# Patient Record
Sex: Female | Born: 1965 | Race: Black or African American | Hispanic: No | Smoking: Former smoker
Health system: Southern US, Community
[De-identification: ages and names within clinical notes are randomized; demographics above are authoritative.]

## PROBLEM LIST (undated history)

## (undated) DIAGNOSIS — R45851 Suicidal ideations: Secondary | ICD-10-CM

## (undated) DIAGNOSIS — K219 Gastro-esophageal reflux disease without esophagitis: Secondary | ICD-10-CM

## (undated) DIAGNOSIS — I219 Acute myocardial infarction, unspecified: Secondary | ICD-10-CM

## (undated) DIAGNOSIS — J45909 Unspecified asthma, uncomplicated: Secondary | ICD-10-CM

## (undated) DIAGNOSIS — T50901A Poisoning by unspecified drugs, medicaments and biological substances, accidental (unintentional), initial encounter: Secondary | ICD-10-CM

## (undated) DIAGNOSIS — F259 Schizoaffective disorder, unspecified: Secondary | ICD-10-CM

## (undated) DIAGNOSIS — F329 Major depressive disorder, single episode, unspecified: Secondary | ICD-10-CM

## (undated) DIAGNOSIS — S82891A Other fracture of right lower leg, initial encounter for closed fracture: Secondary | ICD-10-CM

## (undated) DIAGNOSIS — I5021 Acute systolic (congestive) heart failure: Secondary | ICD-10-CM

## (undated) DIAGNOSIS — F32A Depression, unspecified: Secondary | ICD-10-CM

## (undated) HISTORY — PX: BACK SURGERY: SHX140

## (undated) HISTORY — PX: OTHER SURGICAL HISTORY: SHX169

## (undated) HISTORY — DX: Poisoning by unspecified drugs, medicaments and biological substances, accidental (unintentional), initial encounter: T50.901A

## (undated) HISTORY — PX: LIPOMA EXCISION: SHX5283

## (undated) HISTORY — PX: HAMMER TOE SURGERY: SHX385

## (undated) HISTORY — DX: Acute systolic (congestive) heart failure: I50.21

## (undated) HISTORY — PX: FOOT FASCIOTOMY: SHX1659

## (undated) HISTORY — DX: Suicidal ideations: R45.851

## (undated) HISTORY — PX: ENDOMETRIAL ABLATION W/ NOVASURE: SUR434

## (undated) HISTORY — DX: Acute myocardial infarction, unspecified: I21.9

## (undated) HISTORY — PX: KNEE ARTHROCENTESIS: SUR44

---

## 2001-08-28 ENCOUNTER — Emergency Department (HOSPITAL_COMMUNITY): Admission: EM | Admit: 2001-08-28 | Discharge: 2001-08-28 | Payer: Self-pay | Admitting: Emergency Medicine

## 2001-09-25 ENCOUNTER — Emergency Department (HOSPITAL_COMMUNITY): Admission: EM | Admit: 2001-09-25 | Discharge: 2001-09-25 | Payer: Self-pay | Admitting: Emergency Medicine

## 2001-11-16 ENCOUNTER — Emergency Department (HOSPITAL_COMMUNITY): Admission: EM | Admit: 2001-11-16 | Discharge: 2001-11-16 | Payer: Self-pay | Admitting: Emergency Medicine

## 2003-11-08 ENCOUNTER — Emergency Department (HOSPITAL_COMMUNITY): Admission: EM | Admit: 2003-11-08 | Discharge: 2003-11-08 | Payer: Self-pay | Admitting: *Deleted

## 2003-11-15 ENCOUNTER — Emergency Department (HOSPITAL_COMMUNITY): Admission: EM | Admit: 2003-11-15 | Discharge: 2003-11-15 | Payer: Self-pay | Admitting: Emergency Medicine

## 2006-01-17 ENCOUNTER — Emergency Department (HOSPITAL_COMMUNITY): Admission: EM | Admit: 2006-01-17 | Discharge: 2006-01-17 | Payer: Self-pay | Admitting: Emergency Medicine

## 2007-06-07 ENCOUNTER — Emergency Department (HOSPITAL_COMMUNITY): Admission: EM | Admit: 2007-06-07 | Discharge: 2007-06-07 | Payer: Self-pay | Admitting: Emergency Medicine

## 2008-07-01 ENCOUNTER — Emergency Department (HOSPITAL_COMMUNITY): Admission: EM | Admit: 2008-07-01 | Discharge: 2008-07-01 | Payer: Self-pay | Admitting: Emergency Medicine

## 2008-08-15 ENCOUNTER — Ambulatory Visit: Payer: Self-pay | Admitting: Diagnostic Radiology

## 2008-08-15 ENCOUNTER — Emergency Department (HOSPITAL_BASED_OUTPATIENT_CLINIC_OR_DEPARTMENT_OTHER): Admission: EM | Admit: 2008-08-15 | Discharge: 2008-08-15 | Payer: Self-pay | Admitting: Emergency Medicine

## 2008-09-24 ENCOUNTER — Emergency Department (HOSPITAL_BASED_OUTPATIENT_CLINIC_OR_DEPARTMENT_OTHER): Admission: EM | Admit: 2008-09-24 | Discharge: 2008-09-24 | Payer: Self-pay | Admitting: Emergency Medicine

## 2008-12-30 ENCOUNTER — Ambulatory Visit: Payer: Self-pay | Admitting: Diagnostic Radiology

## 2008-12-30 ENCOUNTER — Emergency Department (HOSPITAL_BASED_OUTPATIENT_CLINIC_OR_DEPARTMENT_OTHER): Admission: EM | Admit: 2008-12-30 | Discharge: 2008-12-30 | Payer: Self-pay | Admitting: Emergency Medicine

## 2009-07-01 ENCOUNTER — Emergency Department (HOSPITAL_BASED_OUTPATIENT_CLINIC_OR_DEPARTMENT_OTHER): Admission: EM | Admit: 2009-07-01 | Discharge: 2009-07-01 | Payer: Self-pay | Admitting: Emergency Medicine

## 2009-08-01 ENCOUNTER — Emergency Department (HOSPITAL_BASED_OUTPATIENT_CLINIC_OR_DEPARTMENT_OTHER): Admission: EM | Admit: 2009-08-01 | Discharge: 2009-08-01 | Payer: Self-pay | Admitting: Emergency Medicine

## 2009-10-13 ENCOUNTER — Emergency Department (HOSPITAL_BASED_OUTPATIENT_CLINIC_OR_DEPARTMENT_OTHER): Admission: EM | Admit: 2009-10-13 | Discharge: 2009-10-13 | Payer: Self-pay | Admitting: Emergency Medicine

## 2010-01-26 ENCOUNTER — Emergency Department (HOSPITAL_BASED_OUTPATIENT_CLINIC_OR_DEPARTMENT_OTHER): Admission: EM | Admit: 2010-01-26 | Discharge: 2010-01-26 | Payer: Self-pay | Admitting: Emergency Medicine

## 2010-02-14 ENCOUNTER — Emergency Department (HOSPITAL_BASED_OUTPATIENT_CLINIC_OR_DEPARTMENT_OTHER): Admission: EM | Admit: 2010-02-14 | Discharge: 2010-02-14 | Payer: Self-pay | Admitting: Emergency Medicine

## 2010-06-12 ENCOUNTER — Emergency Department (HOSPITAL_BASED_OUTPATIENT_CLINIC_OR_DEPARTMENT_OTHER)
Admission: EM | Admit: 2010-06-12 | Discharge: 2010-06-12 | Disposition: A | Discharge status: Home / Self Care | Payer: PRIVATE HEALTH INSURANCE | Attending: Emergency Medicine | Admitting: Emergency Medicine

## 2010-06-12 DIAGNOSIS — E119 Type 2 diabetes mellitus without complications: Secondary | ICD-10-CM | POA: Insufficient documentation

## 2010-06-12 DIAGNOSIS — F319 Bipolar disorder, unspecified: Secondary | ICD-10-CM | POA: Insufficient documentation

## 2010-06-12 DIAGNOSIS — M549 Dorsalgia, unspecified: Secondary | ICD-10-CM | POA: Insufficient documentation

## 2010-06-12 DIAGNOSIS — J45909 Unspecified asthma, uncomplicated: Secondary | ICD-10-CM | POA: Insufficient documentation

## 2010-07-12 LAB — URINALYSIS, ROUTINE W REFLEX MICROSCOPIC
Bilirubin Urine: NEGATIVE
Glucose, UA: NEGATIVE mg/dL
Hgb urine dipstick: NEGATIVE
Ketones, ur: NEGATIVE mg/dL
Nitrite: NEGATIVE
pH: 7 (ref 5.0–8.0)

## 2010-07-12 LAB — URINE CULTURE
Colony Count: 45000
Culture  Setup Time: 201110190632

## 2010-07-12 LAB — URINE MICROSCOPIC-ADD ON

## 2010-07-13 LAB — URINALYSIS, ROUTINE W REFLEX MICROSCOPIC
Bilirubin Urine: NEGATIVE
Glucose, UA: 500 mg/dL — AB
Hgb urine dipstick: NEGATIVE
Ketones, ur: NEGATIVE mg/dL
pH: 5.5 (ref 5.0–8.0)

## 2010-07-13 LAB — URINE MICROSCOPIC-ADD ON

## 2010-07-19 LAB — GLUCOSE, CAPILLARY: Glucose-Capillary: 209 mg/dL — ABNORMAL HIGH (ref 70–99)

## 2010-07-24 LAB — GC/CHLAMYDIA PROBE AMP, GENITAL
Chlamydia, DNA Probe: NEGATIVE
GC Probe Amp, Genital: NEGATIVE

## 2010-07-24 LAB — WET PREP, GENITAL
Trich, Wet Prep: NONE SEEN
Yeast Wet Prep HPF POC: NONE SEEN

## 2010-07-24 LAB — URINALYSIS, ROUTINE W REFLEX MICROSCOPIC
Bilirubin Urine: NEGATIVE
Glucose, UA: NEGATIVE mg/dL
Hgb urine dipstick: NEGATIVE
Ketones, ur: NEGATIVE mg/dL
Nitrite: NEGATIVE
Protein, ur: NEGATIVE mg/dL
Specific Gravity, Urine: 1.017 (ref 1.005–1.030)
Urobilinogen, UA: 1 mg/dL (ref 0.0–1.0)
pH: 5.5 (ref 5.0–8.0)

## 2010-08-04 LAB — WET PREP, GENITAL: Trich, Wet Prep: NONE SEEN

## 2010-08-04 LAB — URINALYSIS, ROUTINE W REFLEX MICROSCOPIC
Glucose, UA: NEGATIVE mg/dL
Hgb urine dipstick: NEGATIVE
Ketones, ur: NEGATIVE mg/dL
Protein, ur: NEGATIVE mg/dL
Urobilinogen, UA: 0.2 mg/dL (ref 0.0–1.0)

## 2010-08-04 LAB — GC/CHLAMYDIA PROBE AMP, GENITAL: GC Probe Amp, Genital: NEGATIVE

## 2010-08-08 LAB — URINALYSIS, ROUTINE W REFLEX MICROSCOPIC
Glucose, UA: NEGATIVE mg/dL
Ketones, ur: NEGATIVE mg/dL
Nitrite: NEGATIVE
Specific Gravity, Urine: 1.008 (ref 1.005–1.030)
pH: 6 (ref 5.0–8.0)

## 2010-08-08 LAB — WET PREP, GENITAL
Trich, Wet Prep: NONE SEEN
Yeast Wet Prep HPF POC: NONE SEEN

## 2010-08-08 LAB — URINE MICROSCOPIC-ADD ON

## 2010-08-08 LAB — PREGNANCY, URINE: Preg Test, Ur: NEGATIVE

## 2010-08-09 LAB — DIFFERENTIAL
Eosinophils Absolute: 0.2 10*3/uL (ref 0.0–0.7)
Lymphs Abs: 4.5 10*3/uL — ABNORMAL HIGH (ref 0.7–4.0)
Monocytes Absolute: 0.5 10*3/uL (ref 0.1–1.0)
Monocytes Relative: 5 % (ref 3–12)
Neutro Abs: 4.6 10*3/uL (ref 1.7–7.7)
Neutrophils Relative %: 47 % (ref 43–77)

## 2010-08-09 LAB — CBC
Hemoglobin: 13.9 g/dL (ref 12.0–15.0)
MCV: 89.5 fL (ref 78.0–100.0)
RBC: 4.72 MIL/uL (ref 3.87–5.11)
WBC: 9.9 10*3/uL (ref 4.0–10.5)

## 2010-08-09 LAB — URINALYSIS, ROUTINE W REFLEX MICROSCOPIC
Bilirubin Urine: NEGATIVE
Glucose, UA: NEGATIVE mg/dL
Hgb urine dipstick: NEGATIVE
Specific Gravity, Urine: 1.015 (ref 1.005–1.030)
Urobilinogen, UA: 1 mg/dL (ref 0.0–1.0)
pH: 6 (ref 5.0–8.0)

## 2010-08-09 LAB — BASIC METABOLIC PANEL
CO2: 28 mEq/L (ref 19–32)
Chloride: 105 mEq/L (ref 96–112)
Creatinine, Ser: 0.8 mg/dL (ref 0.4–1.2)
GFR calc Af Amer: >60 mL/min (ref >60)
Sodium: 143 mEq/L (ref 135–145)

## 2010-08-09 LAB — PREGNANCY, URINE: Preg Test, Ur: NEGATIVE

## 2010-08-10 LAB — BASIC METABOLIC PANEL
BUN: 7 mg/dL (ref 6–23)
CO2: 22 mEq/L (ref 19–32)
Glucose, Bld: 103 mg/dL — ABNORMAL HIGH (ref 70–99)
Potassium: 3.9 mEq/L (ref 3.5–5.1)
Sodium: 136 mEq/L (ref 135–145)

## 2010-08-10 LAB — CBC
HCT: 37.8 % (ref 36.0–46.0)
Hemoglobin: 13 g/dL (ref 12.0–15.0)
MCHC: 34.3 g/dL (ref 30.0–36.0)
RDW: 14.2 % (ref 11.5–15.5)

## 2010-08-10 LAB — DIFFERENTIAL
Basophils Absolute: 0.1 10*3/uL (ref 0.0–0.1)
Basophils Relative: 1 % (ref 0–1)
Eosinophils Absolute: 0.2 10*3/uL (ref 0.0–0.7)
Eosinophils Relative: 3 % (ref 0–5)
Monocytes Absolute: 0.5 10*3/uL (ref 0.1–1.0)

## 2010-10-08 ENCOUNTER — Emergency Department (HOSPITAL_BASED_OUTPATIENT_CLINIC_OR_DEPARTMENT_OTHER)
Admission: EM | Admit: 2010-10-08 | Discharge: 2010-10-08 | Disposition: A | Discharge status: Home / Self Care | Payer: PRIVATE HEALTH INSURANCE | Attending: Emergency Medicine | Admitting: Emergency Medicine

## 2010-10-08 DIAGNOSIS — J45909 Unspecified asthma, uncomplicated: Secondary | ICD-10-CM | POA: Insufficient documentation

## 2010-10-08 DIAGNOSIS — R509 Fever, unspecified: Secondary | ICD-10-CM | POA: Insufficient documentation

## 2010-10-08 DIAGNOSIS — F319 Bipolar disorder, unspecified: Secondary | ICD-10-CM | POA: Insufficient documentation

## 2010-10-08 DIAGNOSIS — E119 Type 2 diabetes mellitus without complications: Secondary | ICD-10-CM | POA: Insufficient documentation

## 2010-10-08 DIAGNOSIS — N390 Urinary tract infection, site not specified: Secondary | ICD-10-CM | POA: Insufficient documentation

## 2010-10-08 LAB — URINE MICROSCOPIC-ADD ON

## 2010-10-08 LAB — URINALYSIS, ROUTINE W REFLEX MICROSCOPIC
Glucose, UA: NEGATIVE mg/dL
Protein, ur: NEGATIVE mg/dL
Specific Gravity, Urine: 1.007 (ref 1.005–1.030)
pH: 5.5 (ref 5.0–8.0)

## 2010-10-26 ENCOUNTER — Emergency Department (HOSPITAL_BASED_OUTPATIENT_CLINIC_OR_DEPARTMENT_OTHER)
Admission: EM | Admit: 2010-10-26 | Discharge: 2010-10-26 | Disposition: A | Discharge status: Home / Self Care | Payer: PRIVATE HEALTH INSURANCE | Attending: Emergency Medicine | Admitting: Emergency Medicine

## 2010-10-26 ENCOUNTER — Emergency Department (INDEPENDENT_AMBULATORY_CARE_PROVIDER_SITE_OTHER): Payer: PRIVATE HEALTH INSURANCE

## 2010-10-26 DIAGNOSIS — Y92009 Unspecified place in unspecified non-institutional (private) residence as the place of occurrence of the external cause: Secondary | ICD-10-CM | POA: Insufficient documentation

## 2010-10-26 DIAGNOSIS — J45909 Unspecified asthma, uncomplicated: Secondary | ICD-10-CM | POA: Insufficient documentation

## 2010-10-26 DIAGNOSIS — M25579 Pain in unspecified ankle and joints of unspecified foot: Secondary | ICD-10-CM

## 2010-10-26 DIAGNOSIS — S82899A Other fracture of unspecified lower leg, initial encounter for closed fracture: Secondary | ICD-10-CM | POA: Insufficient documentation

## 2010-10-26 DIAGNOSIS — W19XXXA Unspecified fall, initial encounter: Secondary | ICD-10-CM

## 2010-10-26 DIAGNOSIS — E119 Type 2 diabetes mellitus without complications: Secondary | ICD-10-CM | POA: Insufficient documentation

## 2010-10-26 DIAGNOSIS — W108XXA Fall (on) (from) other stairs and steps, initial encounter: Secondary | ICD-10-CM | POA: Insufficient documentation

## 2010-10-26 DIAGNOSIS — F319 Bipolar disorder, unspecified: Secondary | ICD-10-CM | POA: Insufficient documentation

## 2010-10-31 ENCOUNTER — Ambulatory Visit (INDEPENDENT_AMBULATORY_CARE_PROVIDER_SITE_OTHER): Payer: Self-pay | Admitting: Family Medicine

## 2010-10-31 ENCOUNTER — Encounter: Payer: Self-pay | Admitting: Family Medicine

## 2010-10-31 VITALS — BP 126/81 | HR 81 | Temp 98.0°F | Ht 63.0 in | Wt 186.0 lb

## 2010-10-31 DIAGNOSIS — S99929A Unspecified injury of unspecified foot, initial encounter: Secondary | ICD-10-CM

## 2010-10-31 DIAGNOSIS — S99911A Unspecified injury of right ankle, initial encounter: Secondary | ICD-10-CM

## 2010-10-31 DIAGNOSIS — S8990XA Unspecified injury of unspecified lower leg, initial encounter: Secondary | ICD-10-CM

## 2010-10-31 NOTE — Patient Instructions (Signed)
You have a very small avulsion fracture of your ankle - this is treated the same as you would a severe ankle sprain. Ice the area for 15 minutes at a time, 3-4 times a day Take aleve 1-2 tabs twice a day with food for 1 week then as needed. Elevate above the level of your heart when possible Crutches if needed to help with walking Bear weight when tolerated Depending on severity your doctor will place you in an ACE wrap with a walking boot OR a laceup ankle brace to help with stability while you recover from this injury. Come out of the boot/brace twice a day to do Up/down and alphabet exercises 2-3 sets of each. Consider physical therapy for strengthening and balance exercises. If not improving as expected, we may repeat x-rays or consider further testing like an MRI. Follow up with me in 2 weeks for a recheck.

## 2010-11-02 ENCOUNTER — Encounter: Payer: Self-pay | Admitting: Family Medicine

## 2010-11-02 DIAGNOSIS — S99911A Unspecified injury of right ankle, initial encounter: Secondary | ICD-10-CM | POA: Insufficient documentation

## 2010-11-02 HISTORY — DX: Unspecified injury of right ankle, initial encounter: S99.911A

## 2010-11-02 NOTE — Progress Notes (Signed)
  Subjective:    Patient ID: Karen Patrick, female    DOB: October 08, 1965, 45 y.o.   MRN: [id number]  PCP: None  HPI 45 yo F here for right ankle fracture.  Patient reports that on 6/28 she was going down back steps at her house to take dog out when she inverted right ankle bottom step. Could not bear weight following this, immediate swelling but no pop sensation or heard No prior ankle injuries though reports a prior tibia fracture that she fully recovered from. Has been using crutches and cam walker. Taking pain medication given from ED. Elevating.  History reviewed. No pertinent past medical history.  No current outpatient prescriptions on file prior to visit.    Past Surgical History  Procedure Date  . Hammer toe surgery     No Known Allergies  History   Social History  . Marital Status: Married    Spouse Name: N/A    Number of Children: N/A  . Years of Education: N/A   Occupational History  . Not on file.   Social History Main Topics  . Smoking status: Never Smoker   . Smokeless tobacco: Not on file  . Alcohol Use: Not on file  . Drug Use: Not on file  . Sexually Active: Not on file   Other Topics Concern  . Not on file   Social History Narrative  . No narrative on file    Family History  Problem Relation Age of Onset  . Stroke Mother   . Diabetes Mother   . Hypertension Mother   . Stroke Father   . Sudden death Father   . Hypertension Father   . Hyperlipidemia Father   . Heart attack Father   . Diabetes Father   . Diabetes Maternal Grandmother   . Heart attack Maternal Grandmother   . Hypertension Maternal Grandmother   . Diabetes Paternal Grandmother   . Sudden death Paternal Grandmother     BP 126/81  Pulse 81  Temp(Src) 98 F (36.7 C) (Oral)  Ht 5\' 3"  (1.6 m)  Wt 186 lb (91.478 kg)  BMI 32.95 kg/m2  Review of Systems See HPI above.    Objective:   Physical Exam Gen: NAD R ankle: Mild swelling lateral ankle.  No bruising, erythema,  skin breakdown. TTP over ATFL and tip of distal fibula.  No medial malleolus, base 5th, navicular, fibular head, or other TTP about foot or ankle. ROM mod limited in all directions but can move in all directions.  Did not test resisted strength. 1+ talar tilt and ant drawer.  Negative thompsons. NVI distally.     Assessment & Plan:  1. R ankle distal fibula avulsion fracture - very small fragment and has more tenderness over ATFL than tip of distal fibula on exam.  Should heal well with conservative therapy - treat as you would a severe ankle sprain.  Cam walker for comfort - will transition to an ASO.  Come out of this for simple up/down exercises and alphabet exercises.  At f/u will transition to either PT or HEP with theraband.  Icing, aleve, elevation.  See instructions for further.  F/u in 2 weeks.

## 2010-11-02 NOTE — Assessment & Plan Note (Signed)
R ankle distal fibula avulsion fracture - very small fragment and has more tenderness over ATFL than tip of distal fibula on exam.  Should heal well with conservative therapy - treat as you would a severe ankle sprain.  Cam walker for comfort - will transition to an ASO.  Come out of this for simple up/down exercises and alphabet exercises.  At f/u will transition to either PT or HEP with theraband.  Icing, aleve, elevation.  See instructions for further.  F/u in 2 weeks.

## 2010-11-14 ENCOUNTER — Encounter: Payer: Self-pay | Admitting: Family Medicine

## 2010-11-14 ENCOUNTER — Ambulatory Visit (INDEPENDENT_AMBULATORY_CARE_PROVIDER_SITE_OTHER): Payer: Self-pay | Admitting: Family Medicine

## 2010-11-14 VITALS — BP 150/99 | HR 80 | Ht 62.5 in | Wt 190.0 lb

## 2010-11-14 DIAGNOSIS — S99919A Unspecified injury of unspecified ankle, initial encounter: Secondary | ICD-10-CM

## 2010-11-14 DIAGNOSIS — S8990XA Unspecified injury of unspecified lower leg, initial encounter: Secondary | ICD-10-CM

## 2010-11-14 DIAGNOSIS — S99911A Unspecified injury of right ankle, initial encounter: Secondary | ICD-10-CM

## 2010-11-14 NOTE — Progress Notes (Signed)
Subjective:    Patient ID: Karen Patrick, female    DOB: 08-23-1965, 45 y.o.   MRN: [id number]  PCP: None  HPI  45 yo F here for f/u right ankle fracture.  7/3: Patient reports that on 6/28 she was going down back steps at her house to take dog out when she inverted right ankle bottom step. Could not bear weight following this, immediate swelling but no pop sensation or heard No prior ankle injuries though reports a prior tibia fracture that she fully recovered from. Has been using crutches and cam walker. Taking pain medication given from ED. Elevating.  Today 7/17: Patient is recovering well from her right ankle distal fibula avulsion fracture. Minimal pain at fracture site. Feels some cramping and soreness behind right knee and lateral right calf worse with ambulation.  No shortness of breath, chest pain, warmth, redness, swelling compared to left. Has been doing home ROM exercises for ankle Was icing and elevating - not doing as much now. ASO for ambulation. No longer requiring crutches.  History reviewed. No pertinent past medical history.  No current outpatient prescriptions on file prior to visit.    Past Surgical History  Procedure Date  . Hammer toe surgery     No Known Allergies  History   Social History  . Marital Status: Married    Spouse Name: N/A    Number of Children: N/A  . Years of Education: N/A   Occupational History  . Not on file.   Social History Main Topics  . Smoking status: Never Smoker   . Smokeless tobacco: Not on file  . Alcohol Use: Not on file  . Drug Use: Not on file  . Sexually Active: Not on file   Other Topics Concern  . Not on file   Social History Narrative  . No narrative on file    Family History  Problem Relation Age of Onset  . Stroke Mother   . Diabetes Mother   . Hypertension Mother   . Stroke Father   . Sudden death Father   . Hypertension Father   . Hyperlipidemia Father   . Heart attack Father   .  Diabetes Father   . Diabetes Maternal Grandmother   . Heart attack Maternal Grandmother   . Hypertension Maternal Grandmother   . Diabetes Paternal Grandmother   . Sudden death Paternal Grandmother     BP 150/99  Pulse 80  Ht 5' 2.5" (1.588 m)  Wt 190 lb (86.183 kg)  BMI 34.20 kg/m2  Review of Systems  See HPI above.    Objective:   Physical Exam  Gen: NAD R ankle: No swelling lateral ankle.  No bruising, erythema, skin breakdown. Minimal TTP over ATFL and tip of distal fibula.  No medial malleolus, base 5th, navicular, fibular head, or other TTP about foot or ankle.  Mild TTP without redness, warmth, swelling of lateral calf and deep in popliteal area. FROM with 5/5 strength all motions of ankle. 1+ talar tilt and ant drawer.  Negative thompsons. NVI distally.     Assessment & Plan:  1. R ankle distal fibula avulsion fracture - significantly improved.  Add theraband and balance exercises moving forward (demonstrated today).  No evidence of DVT on exam - feel her pain in lateral calf and deep popliteal region is muscular - discussed warmth, redness, swelling - if occurs to seek care.  ASO for the next 3 weeks, exercises for next 6 weeks (add balance in about 3 weeks.  Icing, elevation as needed.  F/u prn or call with any concerns.

## 2010-11-14 NOTE — Patient Instructions (Signed)
Continue wearing the ankle brace when up and walking around for another 3 weeks. Start the theraband exercises for your ankle and do them 3-4 times a week for the next 6 weeks. After 3 more weeks, you can add the balance exercises. Avoid high heels for another 3 weeks as well. Follow up with me as needed.

## 2010-11-14 NOTE — Assessment & Plan Note (Signed)
R ankle distal fibula avulsion fracture - significantly improved.  Add theraband and balance exercises moving forward (demonstrated today).  No evidence of DVT on exam - feel her pain in lateral calf and deep popliteal region is muscular - discussed warmth, redness, swelling - if occurs to seek care.  ASO for the next 3 weeks, exercises for next 6 weeks (add balance in about 3 weeks.  Icing, elevation as needed.  F/u prn or call with any concerns.

## 2010-12-03 ENCOUNTER — Encounter (HOSPITAL_BASED_OUTPATIENT_CLINIC_OR_DEPARTMENT_OTHER): Payer: Self-pay | Admitting: Emergency Medicine

## 2010-12-03 ENCOUNTER — Emergency Department (HOSPITAL_BASED_OUTPATIENT_CLINIC_OR_DEPARTMENT_OTHER)
Admission: EM | Admit: 2010-12-03 | Discharge: 2010-12-03 | Disposition: A | Discharge status: Home / Self Care | Payer: Self-pay | Attending: Emergency Medicine | Admitting: Emergency Medicine

## 2010-12-03 DIAGNOSIS — E119 Type 2 diabetes mellitus without complications: Secondary | ICD-10-CM | POA: Insufficient documentation

## 2010-12-03 DIAGNOSIS — Z4800 Encounter for change or removal of nonsurgical wound dressing: Secondary | ICD-10-CM | POA: Insufficient documentation

## 2010-12-03 DIAGNOSIS — IMO0001 Reserved for inherently not codable concepts without codable children: Secondary | ICD-10-CM

## 2010-12-03 NOTE — ED Notes (Signed)
Surgical site to upper back with JP drain intact with no edema or redness to site

## 2010-12-03 NOTE — ED Provider Notes (Signed)
History     CSN: [id number] Arrival date & time: 12/03/2010  4:50 PM  Chief Complaint  Patient presents with  . Wound Check    Pt requesting wound check and drsg change to surgical site and drain to back S/P lipoma removal x day 4   Patient is a 45 y.o. female presenting with wound check. The history is provided by the patient. No language interpreter was used.  Wound Check  Treated in ED: pt had a lipoma removed to back 4 days ago. Prior ED Treatment: dressing change. There has been no treatment since the wound repair. There is no redness present. There is no swelling present.    Past Medical History  Diagnosis Date  . Diabetes mellitus     Past Surgical History  Procedure Date  . Hammer toe surgery   . Lipoma excision   . Knee arthrocentesis   . Foot fasciotomy   . Endometrial ablation w/ novasure     Family History  Problem Relation Age of Onset  . Stroke Mother   . Diabetes Mother   . Hypertension Mother   . Stroke Father   . Sudden death Father   . Hypertension Father   . Hyperlipidemia Father   . Heart attack Father   . Diabetes Father   . Diabetes Maternal Grandmother   . Heart attack Maternal Grandmother   . Hypertension Maternal Grandmother   . Diabetes Paternal Grandmother   . Sudden death Paternal Grandmother     History  Substance Use Topics  . Smoking status: Never Smoker   . Smokeless tobacco: Not on file  . Alcohol Use: No    OB History    Grav Para Term Preterm Abortions TAB SAB Ect Mult Living                  Review of Systems  Constitutional: Negative.   Respiratory: Negative.   Cardiovascular: Negative.   Skin:       Wound to upper back    Physical Exam  BP 137/85  Pulse 88  Temp(Src) 97.7 F (36.5 C) (Oral)  Resp 19  SpO2 99%  Physical Exam  Nursing note and vitals reviewed. Constitutional: She appears well-developed and well-nourished.  Cardiovascular: Normal rate and regular rhythm.   Pulmonary/Chest: Effort normal  and breath sounds normal.  Musculoskeletal: Normal range of motion.  Neurological: She is alert.  Skin:       Pt has a well approximated wound to upper back with wound vac    ED Course  Procedures  MDM Wound healing without any problem:nurse placed a dressing to the area      Teressa Lower, NP 12/03/10 1734

## 2010-12-04 NOTE — ED Provider Notes (Signed)
Medical screening examination/treatment/procedure(s) were performed by non-physician practitioner and as supervising physician I was immediately available for consultation/collaboration.   Juliet Rude. Rubin Payor, MD 12/04/10 1610

## 2010-12-05 ENCOUNTER — Ambulatory Visit: Payer: Self-pay | Admitting: Family Medicine

## 2010-12-18 ENCOUNTER — Encounter (HOSPITAL_BASED_OUTPATIENT_CLINIC_OR_DEPARTMENT_OTHER): Payer: Self-pay | Admitting: Family Medicine

## 2010-12-18 ENCOUNTER — Emergency Department (HOSPITAL_BASED_OUTPATIENT_CLINIC_OR_DEPARTMENT_OTHER)
Admission: EM | Admit: 2010-12-18 | Discharge: 2010-12-18 | Disposition: A | Discharge status: Home / Self Care | Payer: Self-pay | Attending: Emergency Medicine | Admitting: Emergency Medicine

## 2010-12-18 DIAGNOSIS — K0889 Other specified disorders of teeth and supporting structures: Secondary | ICD-10-CM

## 2010-12-18 DIAGNOSIS — E119 Type 2 diabetes mellitus without complications: Secondary | ICD-10-CM | POA: Insufficient documentation

## 2010-12-18 DIAGNOSIS — K089 Disorder of teeth and supporting structures, unspecified: Secondary | ICD-10-CM | POA: Insufficient documentation

## 2010-12-18 MED ORDER — PENICILLIN V POTASSIUM 500 MG PO TABS: 500.0000 mg | ORAL_TABLET | Freq: Four times a day (QID) | ORAL | Status: AC

## 2010-12-18 MED ORDER — OXYCODONE-ACETAMINOPHEN 5-325 MG PO TABS
1.0000 | ORAL_TABLET | Freq: Once | ORAL | Status: AC
  Administered 2010-12-18: 1 via ORAL
  Filled 2010-12-18: qty 1

## 2010-12-18 MED ORDER — IBUPROFEN 600 MG PO TABS: 600.0000 mg | ORAL_TABLET | Freq: Four times a day (QID) | ORAL | Status: DC | PRN

## 2010-12-18 MED ORDER — HYDROCODONE-ACETAMINOPHEN 5-500 MG PO TABS: 1.0000 | ORAL_TABLET | Freq: Four times a day (QID) | ORAL | Status: AC | PRN

## 2010-12-18 NOTE — ED Provider Notes (Signed)
History     CSN: [id number] Arrival date & time: 12/18/2010 12:30 PM  Chief Complaint  Patient presents with  . Dental Pain   Patient is a 45 y.o. female presenting with tooth pain. The history is provided by the patient.  Dental PainPrimary symptoms comment: Facial pain and left lower incisor for 2 days.  The pain is constant it is moderate in severity The symptoms are worsening. The symptoms are new.  Additional symptoms include: gum tenderness and jaw pain. Additional symptoms do not include: gum swelling, trismus, facial swelling, trouble swallowing, pain with swallowing and excessive salivation.    Past Medical History  Diagnosis Date  . Diabetes mellitus     Past Surgical History  Procedure Date  . Hammer toe surgery   . Lipoma excision   . Knee arthrocentesis   . Foot fasciotomy   . Endometrial ablation w/ novasure   . Back surgery     Family History  Problem Relation Age of Onset  . Stroke Mother   . Diabetes Mother   . Hypertension Mother   . Stroke Father   . Sudden death Father   . Hypertension Father   . Hyperlipidemia Father   . Heart attack Father   . Diabetes Father   . Diabetes Maternal Grandmother   . Heart attack Maternal Grandmother   . Hypertension Maternal Grandmother   . Diabetes Paternal Grandmother   . Sudden death Paternal Grandmother     History  Substance Use Topics  . Smoking status: Never Smoker   . Smokeless tobacco: Not on file  . Alcohol Use: No    OB History    Grav Para Term Preterm Abortions TAB SAB Ect Mult Living                  Review of Systems  HENT: Negative for facial swelling and trouble swallowing.   All other systems reviewed and are negative.    Physical Exam  BP 125/86  Pulse 73  Temp(Src) 97.8 F (36.6 C) (Oral)  Resp 16  SpO2 100%  Physical Exam  Constitutional: She is oriented to person, place, and time. She appears well-developed and well-nourished.  HENT:  Head: Normocephalic and  atraumatic.       Normal posterior pharynx.  The patient is missing several teeth secondary to dental extractions.  Patient has point tenderness at her left lateral incisor without erythema or gingival swelling.  His medications will  Eyes: EOM are normal.  Neck: Normal range of motion.  Pulmonary/Chest: Effort normal.  Musculoskeletal: Normal range of motion.  Neurological: She is alert and oriented to person, place, and time.  Psychiatric: She has a normal mood and affect.    ED Course  Procedures  MDM Dental Pain. Home with antibiotics and pain medicine. Recommend dental follow up. No signs of gingival abscess. Tolerating secretions. Airway patent. No sub lingular swelling       Lyanne Co, MD 12/18/10 1252

## 2010-12-18 NOTE — ED Notes (Signed)
Pt c/o left lower tooth pain x2 days.

## 2010-12-28 ENCOUNTER — Emergency Department (INDEPENDENT_AMBULATORY_CARE_PROVIDER_SITE_OTHER): Payer: Self-pay

## 2010-12-28 ENCOUNTER — Emergency Department (HOSPITAL_BASED_OUTPATIENT_CLINIC_OR_DEPARTMENT_OTHER)
Admission: EM | Admit: 2010-12-28 | Discharge: 2010-12-28 | Disposition: A | Discharge status: Home / Self Care | Payer: Self-pay | Attending: Emergency Medicine | Admitting: Emergency Medicine

## 2010-12-28 ENCOUNTER — Encounter (HOSPITAL_BASED_OUTPATIENT_CLINIC_OR_DEPARTMENT_OTHER): Payer: Self-pay | Admitting: Family Medicine

## 2010-12-28 DIAGNOSIS — IMO0001 Reserved for inherently not codable concepts without codable children: Secondary | ICD-10-CM | POA: Insufficient documentation

## 2010-12-28 DIAGNOSIS — N390 Urinary tract infection, site not specified: Secondary | ICD-10-CM | POA: Insufficient documentation

## 2010-12-28 DIAGNOSIS — R509 Fever, unspecified: Secondary | ICD-10-CM | POA: Insufficient documentation

## 2010-12-28 LAB — COMPREHENSIVE METABOLIC PANEL
ALT: 26 U/L (ref 0–35)
AST: 19 U/L (ref 0–37)
Albumin: 3.9 g/dL (ref 3.5–5.2)
CO2: 22 mEq/L (ref 19–32)
Calcium: 9.2 mg/dL (ref 8.4–10.5)
Chloride: 104 mEq/L (ref 96–112)
GFR calc non Af Amer: >60 mL/min (ref >60)
Sodium: 137 mEq/L (ref 135–145)

## 2010-12-28 LAB — CBC
Platelets: 235 10*3/uL (ref 150–400)
RDW: 13.1 % (ref 11.5–15.5)
WBC: 13 10*3/uL — ABNORMAL HIGH (ref 4.0–10.5)

## 2010-12-28 LAB — URINALYSIS, ROUTINE W REFLEX MICROSCOPIC
Bilirubin Urine: NEGATIVE
Hgb urine dipstick: NEGATIVE
Specific Gravity, Urine: 1.021 (ref 1.005–1.030)
Urobilinogen, UA: 1 mg/dL (ref 0.0–1.0)

## 2010-12-28 LAB — URINE MICROSCOPIC-ADD ON

## 2010-12-28 LAB — DIFFERENTIAL
Basophils Absolute: 0 10*3/uL (ref 0.0–0.1)
Lymphocytes Relative: 12 % (ref 12–46)
Neutro Abs: 10.8 10*3/uL — ABNORMAL HIGH (ref 1.7–7.7)

## 2010-12-28 MED ORDER — IBUPROFEN 600 MG PO TABS: 600.0000 mg | ORAL_TABLET | Freq: Three times a day (TID) | ORAL | Status: AC | PRN

## 2010-12-28 MED ORDER — SULFAMETHOXAZOLE-TMP DS 800-160 MG PO TABS
1.0000 | ORAL_TABLET | Freq: Once | ORAL | Status: AC
  Administered 2010-12-28: 1 via ORAL
  Filled 2010-12-28: qty 1

## 2010-12-28 MED ORDER — ACETAMINOPHEN 500 MG PO TABS
1000.0000 mg | ORAL_TABLET | Freq: Four times a day (QID) | ORAL | Status: DC | PRN
  Administered 2010-12-28: 1000 mg via ORAL
  Filled 2010-12-28 (×2): qty 2

## 2010-12-28 MED ORDER — CIPROFLOXACIN HCL 500 MG PO TABS: 500.0000 mg | ORAL_TABLET | Freq: Once | ORAL | Status: DC

## 2010-12-28 MED ORDER — SULFAMETHOXAZOLE-TMP DS 800-160 MG PO TABS: 1.0000 | ORAL_TABLET | Freq: Two times a day (BID) | ORAL | Status: AC

## 2010-12-28 NOTE — ED Notes (Signed)
Pt c/o fever all day with urinary frequency, wound on upper back and toothache. Pt sts she is taking penicillin for tooth and recently started taking insulin for diabetes.

## 2010-12-30 LAB — URINE CULTURE
Colony Count: NO GROWTH
Culture: NO GROWTH

## 2010-12-30 NOTE — ED Provider Notes (Signed)
History   The patient presents for evaluation of fever of 1 day in duration.  She reports that she woke up today feeling weakness generally, fatigue, and myalgias.  The fever waxed and waned during the day, intermittently responding to antipyretic medications.  She reports polyuria and increased urinary frequency but otherwise no dysuria, nausea/vomiting, cough, sore throat, nasal congestion, or diarrhea.  She had a sick contact via her daughter who "had a cold yesterday."  She also reports one episode of urinary incontinence today.  CSN: [id number] Arrival date & time: 12/28/2010  6:47 PM  Chief Complaint  Patient presents with  . Fever   Patient is a 45 y.o. female presenting with fever. The history is provided by the patient.  Fever Primary symptoms of the febrile illness include fever, fatigue and myalgias. Primary symptoms do not include visual change, headaches, cough, wheezing, shortness of breath, abdominal pain, nausea, vomiting, diarrhea, dysuria, altered mental status, arthralgias or rash. The current episode started today. This is a new problem. The problem has not changed since onset. The fever began today. The fever has been unchanged since its onset. The maximum temperature recorded prior to her arrival was unknown.  The fatigue began today. The fatigue has been unchanged since its onset. The fatigue is worsened by nothing.  Myalgias began today. The myalgias have been unchanged since their onset. The myalgias are generalized. The myalgias are aching. The discomfort from the myalgias is mild. The myalgias are associated with weakness. The myalgias are not associated with tenderness or swelling.    Past Medical History  Diagnosis Date  . Diabetes mellitus     Past Surgical History  Procedure Date  . Hammer toe surgery   . Lipoma excision   . Knee arthrocentesis   . Foot fasciotomy   . Endometrial ablation w/ novasure   . Back surgery   . Lipoma removed     Family History   Problem Relation Age of Onset  . Stroke Mother   . Diabetes Mother   . Hypertension Mother   . Stroke Father   . Sudden death Father   . Hypertension Father   . Hyperlipidemia Father   . Heart attack Father   . Diabetes Father   . Diabetes Maternal Grandmother   . Heart attack Maternal Grandmother   . Hypertension Maternal Grandmother   . Diabetes Paternal Grandmother   . Sudden death Paternal Grandmother     History  Substance Use Topics  . Smoking status: Never Smoker   . Smokeless tobacco: Not on file  . Alcohol Use: No    OB History    Grav Para Term Preterm Abortions TAB SAB Ect Mult Living                  Review of Systems  Constitutional: Positive for fever, activity change and fatigue. Negative for chills, diaphoresis, appetite change and unexpected weight change.  HENT: Positive for sneezing. Negative for hearing loss, ear pain, congestion, sore throat, facial swelling, rhinorrhea, drooling, mouth sores, trouble swallowing, neck pain, neck stiffness, voice change, postnasal drip and ear discharge.   Respiratory: Negative for cough, shortness of breath and wheezing.   Cardiovascular: Negative for chest pain and leg swelling.  Gastrointestinal: Negative for nausea, vomiting, abdominal pain, diarrhea and blood in stool.  Genitourinary: Positive for urgency and frequency. Negative for dysuria, hematuria, flank pain, decreased urine volume, vaginal discharge, difficulty urinating and vaginal pain.  Musculoskeletal: Positive for myalgias. Negative for back pain, joint  swelling and arthralgias.  Skin: Negative for color change, rash and wound.  Neurological: Positive for weakness. Negative for dizziness, seizures, numbness and headaches.  Hematological: Negative for adenopathy.  Psychiatric/Behavioral: Negative.  Negative for altered mental status.    Physical Exam  BP 137/88  Pulse 119  Temp(Src) 100.2 F (37.9 C) (Oral)  Resp 18  Ht 5' 2.5" (1.588 m)  Wt 191  lb (86.637 kg)  BMI 34.38 kg/m2  SpO2 99%  Physical Exam  Nursing note and vitals reviewed. Constitutional: She is oriented to person, place, and time. She appears well-developed and well-nourished. She appears distressed.  HENT:  Head: Normocephalic and atraumatic.  Right Ear: External ear normal.  Left Ear: External ear normal.  Nose: Nose normal.  Mouth/Throat: Oropharynx is clear and moist. No oropharyngeal exudate.  Eyes: Conjunctivae are normal. Pupils are equal, round, and reactive to light. No scleral icterus.  Neck: Normal range of motion. Neck supple.  Cardiovascular: Regular rhythm, normal heart sounds and intact distal pulses.  Tachycardia present.  Exam reveals no gallop and no friction rub.   No murmur heard. Pulmonary/Chest: Effort normal and breath sounds normal. No respiratory distress. She has no wheezes. She has no rales.  Abdominal: Soft. Bowel sounds are normal. She exhibits no distension. There is no tenderness. There is no rebound and no guarding.  Musculoskeletal: Normal range of motion. She exhibits no edema and no tenderness.  Lymphadenopathy:    She has no cervical adenopathy.  Neurological: She is alert and oriented to person, place, and time. She has normal reflexes. No cranial nerve deficit. She exhibits normal muscle tone. Coordination normal.  Skin: Skin is warm and dry. No rash noted. No erythema. No pallor.  Psychiatric: She has a normal mood and affect. Her behavior is normal. Judgment and thought content normal.    ED Course  Procedures  MDM Viral infection, urinary tract infection, pharyngitis, otitis, pneumonia considered among other potential etiologies in the patient's differential diagnosis.  The patient's urinalysis does not show overt signs of UTI, but does show inflammatory response and based on the patient's only symptoms of increased urinary frequency and an episode of urinary incontinence I will send a urine culture and treat her for  suspected UTI.  Otherwise, this may be the result of a viral syndrome, but none of the other etiologies in the differential diagnosis appear to be at play.     Felisa Bonier, MD 12/30/10 2103

## 2011-01-19 LAB — URINALYSIS, ROUTINE W REFLEX MICROSCOPIC
Bilirubin Urine: NEGATIVE
Ketones, ur: NEGATIVE
Nitrite: NEGATIVE
Protein, ur: NEGATIVE
Specific Gravity, Urine: 1.027
Urobilinogen, UA: 4 — ABNORMAL HIGH

## 2011-01-19 LAB — URINE CULTURE

## 2011-01-25 ENCOUNTER — Encounter (HOSPITAL_BASED_OUTPATIENT_CLINIC_OR_DEPARTMENT_OTHER): Payer: Self-pay | Admitting: *Deleted

## 2011-01-25 ENCOUNTER — Emergency Department (HOSPITAL_BASED_OUTPATIENT_CLINIC_OR_DEPARTMENT_OTHER)
Admission: EM | Admit: 2011-01-25 | Discharge: 2011-01-25 | Disposition: A | Discharge status: Home / Self Care | Payer: Self-pay | Attending: Emergency Medicine | Admitting: Emergency Medicine

## 2011-01-25 DIAGNOSIS — Z79899 Other long term (current) drug therapy: Secondary | ICD-10-CM | POA: Insufficient documentation

## 2011-01-25 DIAGNOSIS — N390 Urinary tract infection, site not specified: Secondary | ICD-10-CM | POA: Insufficient documentation

## 2011-01-25 DIAGNOSIS — E119 Type 2 diabetes mellitus without complications: Secondary | ICD-10-CM | POA: Insufficient documentation

## 2011-01-25 LAB — URINE MICROSCOPIC-ADD ON

## 2011-01-25 LAB — URINALYSIS, ROUTINE W REFLEX MICROSCOPIC
Glucose, UA: NEGATIVE mg/dL
Nitrite: NEGATIVE
Specific Gravity, Urine: 1.016 (ref 1.005–1.030)
pH: 5.5 (ref 5.0–8.0)

## 2011-01-25 MED ORDER — PHENAZOPYRIDINE HCL 200 MG PO TABS: 200.0000 mg | ORAL_TABLET | Freq: Three times a day (TID) | ORAL | Status: AC

## 2011-01-25 MED ORDER — CEPHALEXIN 250 MG PO CAPS
500.0000 mg | ORAL_CAPSULE | Freq: Once | ORAL | Status: AC
  Administered 2011-01-25: 500 mg via ORAL
  Filled 2011-01-25: qty 2

## 2011-01-25 MED ORDER — CEPHALEXIN 500 MG PO CAPS: 500.0000 mg | ORAL_CAPSULE | Freq: Four times a day (QID) | ORAL | Status: AC

## 2011-01-25 NOTE — ED Provider Notes (Signed)
History     CSN: [id number] Arrival date & time: 01/25/2011  8:43 AM  Chief Complaint  Patient presents with  . Urinary Tract Infection    (Consider location/radiation/quality/duration/timing/severity/associated sxs/prior treatment) HPI Comments: Patient took Bactrim proximally 2 weeks ago. Completed a course with no improvement of symptoms. Continues to have dysuria, frequency, urgency. Subjective fevers. No flank pain. No nausea or vomiting  Patient is a 45 y.o. female presenting with urinary tract infection. The history is provided by the patient. No language interpreter was used.  Urinary Tract Infection This is a new problem. The current episode started more than 1 week ago (2 weeks ago). The problem occurs constantly. The problem has been gradually worsening. Associated symptoms include abdominal pain. Pertinent negatives include no chest pain, no headaches and no shortness of breath. The symptoms are aggravated by nothing. The symptoms are relieved by nothing. Treatments tried: bactrim and otc meds. The treatment provided no relief.    Past Medical History  Diagnosis Date  . Diabetes mellitus     Past Surgical History  Procedure Date  . Hammer toe surgery   . Lipoma excision   . Knee arthrocentesis   . Foot fasciotomy   . Endometrial ablation w/ novasure   . Back surgery   . Lipoma removed     Family History  Problem Relation Age of Onset  . Stroke Mother   . Diabetes Mother   . Hypertension Mother   . Stroke Father   . Sudden death Father   . Hypertension Father   . Hyperlipidemia Father   . Heart attack Father   . Diabetes Father   . Diabetes Maternal Grandmother   . Heart attack Maternal Grandmother   . Hypertension Maternal Grandmother   . Diabetes Paternal Grandmother   . Sudden death Paternal Grandmother     History  Substance Use Topics  . Smoking status: Never Smoker   . Smokeless tobacco: Not on file  . Alcohol Use: No    OB History    Grav  Para Term Preterm Abortions TAB SAB Ect Mult Living                  Review of Systems  Constitutional: Positive for fever (subjective). Negative for chills, activity change and appetite change.  HENT: Negative for congestion, sore throat, rhinorrhea and neck pain.   Respiratory: Negative for cough and shortness of breath.   Cardiovascular: Negative for chest pain and palpitations.  Gastrointestinal: Positive for abdominal pain. Negative for nausea, vomiting and diarrhea.  Genitourinary: Positive for dysuria, urgency and frequency. Negative for flank pain, vaginal bleeding, vaginal discharge and difficulty urinating.  Musculoskeletal: Negative for back pain.  Neurological: Negative for dizziness, light-headedness and headaches.  All other systems reviewed and are negative.    Allergies  Review of patient's allergies indicates no known allergies.  Home Medications   Current Outpatient Rx  Name Route Sig Dispense Refill  . CEPHALEXIN 500 MG PO CAPS Oral Take 1 capsule (500 mg total) by mouth 4 (four) times daily. 28 capsule 0  . GLIPIZIDE 5 MG PO TB24 Oral Take 5 mg by mouth daily.      . INSULIN GLARGINE 100 UNIT/ML [place] SOLN Subcutaneous Inject 14 Units into the skin at bedtime.      Marland Kitchen METFORMIN HCL 1000 MG PO TABS Oral Take 1,000 mg by mouth 2 (two) times daily with a meal.      . ADVANCED DIABETIC MULTIVITAMIN PO Oral Take 1 packet  by mouth daily.      Marland Kitchen PENICILLIN V POTASSIUM 500 MG PO TABS Oral Take 500 mg by mouth 4 (four) times daily.      Marland Kitchen PHENAZOPYRIDINE HCL 200 MG PO TABS Oral Take 1 tablet (200 mg total) by mouth 3 (three) times daily. 6 tablet 0  . TRAMADOL HCL 50 MG PO TABS Oral Take 50 mg by mouth every 6 (six) hours as needed. Pain       BP 139/87  Pulse 82  Temp(Src) 98.4 F (36.9 C) (Oral)  Resp 20  SpO2 100%  Physical Exam  Nursing note and vitals reviewed. Constitutional: She is oriented to person, place, and time. She appears well-developed and  well-nourished. No distress.  HENT:  Head: Normocephalic and atraumatic.  Mouth/Throat: Oropharynx is clear and moist. No oropharyngeal exudate.  Eyes: Conjunctivae and EOM are normal. Pupils are equal, round, and reactive to light.  Neck: Normal range of motion. Neck supple.  Cardiovascular: Normal rate, regular rhythm and intact distal pulses.  Exam reveals friction rub. Exam reveals no gallop.   No murmur heard. Pulmonary/Chest: Effort normal and breath sounds normal. No respiratory distress.  Abdominal: Soft. Bowel sounds are normal. There is no tenderness.  Musculoskeletal: Normal range of motion. She exhibits no tenderness.       No cva tenderness  Neurological: She is alert and oriented to person, place, and time.  Skin: Skin is warm and dry. No rash noted.    ED Course  Procedures (including critical care time)  Labs Reviewed  URINALYSIS, ROUTINE W REFLEX MICROSCOPIC - Abnormal; Notable for the following:    Appearance CLOUDY (*)    Hgb urine dipstick SMALL (*)    Leukocytes, UA LARGE (*)    All other components within normal limits  URINE MICROSCOPIC-ADD ON - Abnormal; Notable for the following:    Bacteria, UA MANY (*)    All other components within normal limits  URINE CULTURE   No results found.   1. UTI (urinary tract infection)       MDM  Urine consistent with urinary tract infection. She has no signs or symptoms of pyelonephritis. She received her first dose of Keflex in the emergency department. She'll be discharged home with a one-week course as well as Pyridium for symptom control. A urine culture was sent. She is instructed to followup with her primary care physician this coming week        Dayton Bailiff, MD 01/25/11 1002

## 2011-01-25 NOTE — ED Notes (Signed)
MD at bedside. 

## 2011-01-25 NOTE — ED Notes (Signed)
Pt states was seen about 2 weeks ago here treated with bactrim for uti took all of the meds but states she has been getting worse. Taking otc cystex for the dysuria and reports she has been febrile so has been taking 600 mg of ibuprofen a couple of times per day

## 2011-01-27 LAB — URINE CULTURE
Colony Count: >=100000
Culture  Setup Time: 201209271408
Special Requests: NORMAL

## 2011-03-25 ENCOUNTER — Emergency Department (HOSPITAL_BASED_OUTPATIENT_CLINIC_OR_DEPARTMENT_OTHER)
Admission: EM | Admit: 2011-03-25 | Discharge: 2011-03-25 | Disposition: A | Discharge status: Home / Self Care | Payer: Self-pay | Attending: Emergency Medicine | Admitting: Emergency Medicine

## 2011-03-25 ENCOUNTER — Encounter (HOSPITAL_BASED_OUTPATIENT_CLINIC_OR_DEPARTMENT_OTHER): Payer: Self-pay | Admitting: *Deleted

## 2011-03-25 DIAGNOSIS — J069 Acute upper respiratory infection, unspecified: Secondary | ICD-10-CM | POA: Insufficient documentation

## 2011-03-25 DIAGNOSIS — E119 Type 2 diabetes mellitus without complications: Secondary | ICD-10-CM | POA: Insufficient documentation

## 2011-03-25 MED ORDER — HYDROCOD POLST-CHLORPHEN POLST 10-8 MG/5ML PO LQCR: 5.0000 mL | Freq: Two times a day (BID) | ORAL | Status: DC | PRN

## 2011-03-25 MED ORDER — AZITHROMYCIN 250 MG PO TABS: 250.0000 mg | ORAL_TABLET | Freq: Every day | ORAL | Status: AC

## 2011-03-25 NOTE — ED Provider Notes (Signed)
History     CSN: [id number] Arrival date & time: 03/25/2011  8:00 AM   First MD Initiated Contact with Patient 03/25/11 [phone]      Chief Complaint  Patient presents with  . Emesis    (Consider location/radiation/quality/duration/timing/severity/associated sxs/prior treatment) HPI Comments: Congestion, productive cough, nosebleeds, sinus pressure for several days.  Getting worse.  No fevers.  No sick contacts.    Patient is a 45 y.o. female presenting with vomiting. The history is provided by the patient.  Emesis  This is a new problem. The current episode started more than 2 days ago. The problem has been gradually worsening. Associated symptoms include diarrhea. Pertinent negatives include no chills and no fever.    Past Medical History  Diagnosis Date  . Diabetes mellitus     Past Surgical History  Procedure Date  . Hammer toe surgery   . Lipoma excision   . Knee arthrocentesis   . Foot fasciotomy   . Endometrial ablation w/ novasure   . Back surgery   . Lipoma removed     Family History  Problem Relation Age of Onset  . Stroke Mother   . Diabetes Mother   . Hypertension Mother   . Stroke Father   . Sudden death Father   . Hypertension Father   . Hyperlipidemia Father   . Heart attack Father   . Diabetes Father   . Diabetes Maternal Grandmother   . Heart attack Maternal Grandmother   . Hypertension Maternal Grandmother   . Diabetes Paternal Grandmother   . Sudden death Paternal Grandmother     History  Substance Use Topics  . Smoking status: Former Games developer  . Smokeless tobacco: Not on file  . Alcohol Use: No    OB History    Grav Para Term Preterm Abortions TAB SAB Ect Mult Living                  Review of Systems  Constitutional: Negative for fever and chills.  Gastrointestinal: Positive for vomiting and diarrhea.  All other systems reviewed and are negative.    Allergies  Review of patient's allergies indicates no known  allergies.  Home Medications   Current Outpatient Rx  Name Route Sig Dispense Refill  . GLIPIZIDE ER 5 MG PO TB24 Oral Take 5 mg by mouth daily.      . INSULIN GLARGINE 100 UNIT/ML [place] SOLN Subcutaneous Inject 14 Units into the skin at bedtime.      Marland Kitchen METFORMIN HCL 1000 MG PO TABS Oral Take 1,000 mg by mouth 2 (two) times daily with a meal.      . ADVANCED DIABETIC MULTIVITAMIN PO Oral Take 1 packet by mouth daily.      Marland Kitchen PENICILLIN V POTASSIUM 500 MG PO TABS Oral Take 500 mg by mouth 4 (four) times daily.      . TRAMADOL HCL 50 MG PO TABS Oral Take 50 mg by mouth every 6 (six) hours as needed. Pain       BP 161/96  Pulse 90  Temp(Src) 98.5 F (36.9 C) (Oral)  Resp 21  SpO2 100%  Physical Exam  Nursing note and vitals reviewed. Constitutional: She is oriented to person, place, and time. She appears well-developed and well-nourished. No distress.  HENT:  Head: Normocephalic and atraumatic.  Neck: Normal range of motion. Neck supple.  Cardiovascular: Normal rate and regular rhythm.  Exam reveals no gallop and no friction rub.   No murmur heard. Pulmonary/Chest: Effort normal  and breath sounds normal. No respiratory distress. She has no wheezes.  Abdominal: Soft. Bowel sounds are normal. She exhibits no distension. There is no tenderness.  Musculoskeletal: Normal range of motion.  Neurological: She is alert and oriented to person, place, and time.  Skin: Skin is warm and dry. She is not diaphoretic.    ED Course  Procedures (including critical care time)  Labs Reviewed - No data to display No results found.   No diagnosis found.    MDM  URI, likely viral but possible sinusitis.  Will give cough syrup and prescription for antibiotics which I have advised to fill if not improving in the next 2-3 days.        [name], MD 03/25/11 [phone]

## 2011-03-25 NOTE — ED Notes (Signed)
Patient states that she has had a cold for the past two days and yesterday started having nosebleeds,  Took mucinex two days ago, but started having vomiting and diarrhea yesterday/coughing

## 2011-05-16 ENCOUNTER — Emergency Department (HOSPITAL_BASED_OUTPATIENT_CLINIC_OR_DEPARTMENT_OTHER)
Admission: EM | Admit: 2011-05-16 | Discharge: 2011-05-16 | Disposition: A | Discharge status: Home / Self Care | Payer: Self-pay | Attending: Emergency Medicine | Admitting: Emergency Medicine

## 2011-05-16 ENCOUNTER — Encounter (HOSPITAL_BASED_OUTPATIENT_CLINIC_OR_DEPARTMENT_OTHER): Payer: Self-pay | Admitting: *Deleted

## 2011-05-16 DIAGNOSIS — E119 Type 2 diabetes mellitus without complications: Secondary | ICD-10-CM | POA: Insufficient documentation

## 2011-05-16 DIAGNOSIS — R300 Dysuria: Secondary | ICD-10-CM | POA: Insufficient documentation

## 2011-05-16 DIAGNOSIS — R109 Unspecified abdominal pain: Secondary | ICD-10-CM | POA: Insufficient documentation

## 2011-05-16 LAB — URINALYSIS, ROUTINE W REFLEX MICROSCOPIC
Bilirubin Urine: NEGATIVE
Ketones, ur: NEGATIVE mg/dL
Protein, ur: NEGATIVE mg/dL
Specific Gravity, Urine: 1.007 (ref 1.005–1.030)
pH: 5.5 (ref 5.0–8.0)

## 2011-05-16 LAB — DIFFERENTIAL
Eosinophils Relative: 2 % (ref 0–5)
Lymphocytes Relative: 49 % — ABNORMAL HIGH (ref 12–46)
Lymphs Abs: 4.8 10*3/uL — ABNORMAL HIGH (ref 0.7–4.0)

## 2011-05-16 LAB — BASIC METABOLIC PANEL
CO2: 25 mEq/L (ref 19–32)
Glucose, Bld: 91 mg/dL (ref 70–99)
Potassium: 4 mEq/L (ref 3.5–5.1)
Sodium: 138 mEq/L (ref 135–145)

## 2011-05-16 LAB — URINE CULTURE
Colony Count: 50000
Culture  Setup Time: 201301170132

## 2011-05-16 LAB — CBC
MCV: 86.4 fL (ref 78.0–100.0)
Platelets: 230 10*3/uL (ref 150–400)
RBC: 4.48 MIL/uL (ref 3.87–5.11)
WBC: 9.9 10*3/uL (ref 4.0–10.5)

## 2011-05-16 LAB — URINE MICROSCOPIC-ADD ON

## 2011-05-16 MED ORDER — HYDROCODONE-ACETAMINOPHEN 5-325 MG PO TABS: 1.0000 | ORAL_TABLET | Freq: Four times a day (QID) | ORAL | Status: AC | PRN

## 2011-05-16 MED ORDER — CIPROFLOXACIN HCL 500 MG PO TABS: 500.0000 mg | ORAL_TABLET | Freq: Two times a day (BID) | ORAL | Status: AC

## 2011-05-16 NOTE — ED Notes (Signed)
Bridgette, PA-C at bedside.

## 2011-05-16 NOTE — ED Notes (Signed)
Dysuria pain is worse with sitting or walking. Fever this am.

## 2011-05-16 NOTE — ED Provider Notes (Signed)
History     CSN: [id number]  Arrival date & time 05/16/11  1546   First MD Initiated Contact with Patient 05/16/11 1829     6:58 PM HPI Patient report left flank pain and dysuria. Reports feeling the urge to urinate often but only dribbling. States this morning she had a fever and took ibuprofen which relieved it. Denies nausea, vomiting, diarrhea, vaginal discharge, vaginal bleeding. Reports sometimes Cystex (an over-the-counter urinary relief medication) will take away her infection Patient is a 46 y.o. female presenting with dysuria. The history is provided by the patient.  Dysuria  The current episode started 12 to 24 hours ago. The problem occurs every urination. The problem has not changed since onset.The quality of the pain is described as aching. The pain is moderate. Associated symptoms include urgency and flank pain. Pertinent negatives include no chills, no nausea, no vomiting, no discharge, no frequency and no hematuria. Treatments tried: Cystex. Her past medical history does not include kidney stones.    Past Medical History  Diagnosis Date  . Diabetes mellitus     Past Surgical History  Procedure Date  . Hammer toe surgery   . Lipoma excision   . Knee arthrocentesis   . Foot fasciotomy   . Endometrial ablation w/ novasure   . Back surgery   . Lipoma removed     Family History  Problem Relation Age of Onset  . Stroke Mother   . Diabetes Mother   . Hypertension Mother   . Stroke Father   . Sudden death Father   . Hypertension Father   . Hyperlipidemia Father   . Heart attack Father   . Diabetes Father   . Diabetes Maternal Grandmother   . Heart attack Maternal Grandmother   . Hypertension Maternal Grandmother   . Diabetes Paternal Grandmother   . Sudden death Paternal Grandmother     History  Substance Use Topics  . Smoking status: Former Games developer  . Smokeless tobacco: Not on file  . Alcohol Use: No    OB History    Grav Para Term Preterm Abortions  TAB SAB Ect Mult Living                  Review of Systems  Constitutional: Positive for fever. Negative for chills.  Respiratory: Negative for shortness of breath.   Cardiovascular: Negative for chest pain.  Gastrointestinal: Positive for abdominal pain. Negative for nausea, vomiting, diarrhea and constipation.  Genitourinary: Positive for dysuria, urgency, flank pain and decreased urine volume (dribbling). Negative for frequency, hematuria, vaginal discharge and vaginal pain.  Musculoskeletal: Negative for back pain.  All other systems reviewed and are negative.    Allergies  Review of patient's allergies indicates no known allergies.  Home Medications   Current Outpatient Rx  Name Route Sig Dispense Refill  . IBUPROFEN 200 MG PO TABS Oral Take 400 mg by mouth every 6 (six) hours as needed. For fever/pain    . METFORMIN HCL 1000 MG PO TABS Oral Take 1,000 mg by mouth 2 (two) times daily with a meal.      . METHENAMINE-SODIUM SALICYLATE 162-162.5 MG PO TABS Oral Take 2 tablets by mouth 3 (three) times daily as needed. For urinary tract infection    . ADVANCED DIABETIC MULTIVITAMIN PO Oral Take 1 packet by mouth daily.      Marland Kitchen OVER THE COUNTER MEDICATION Oral Take 1 tablet by mouth daily. AZO Yeast      BP 129/87  Pulse 73  Temp(Src) 98.1 F (36.7 C) (Oral)  Resp 18  SpO2 99%  Physical Exam  Vitals reviewed. Constitutional: She is oriented to person, place, and time. Vital signs are normal. She appears well-developed and well-nourished.  HENT:  Head: Normocephalic and atraumatic.  Eyes: Conjunctivae are normal. Pupils are equal, round, and reactive to light.  Neck: Normal range of motion. Neck supple.  Cardiovascular: Normal rate, regular rhythm and normal heart sounds.  Exam reveals no friction rub.   No murmur heard. Pulmonary/Chest: Effort normal and breath sounds normal. She has no wheezes. She has no rhonchi. She has no rales. She exhibits no tenderness.  Abdominal:  Normal appearance and bowel sounds are normal. She exhibits no distension, no ascites and no mass. There is no hepatosplenomegaly. There is tenderness in the right lower quadrant. There is no rigidity, no rebound, no guarding, no CVA tenderness, no tenderness at McBurney's point and negative Murphy's sign.       Left flank TTP  Musculoskeletal: Normal range of motion.  Neurological: She is alert and oriented to person, place, and time. Coordination normal.  Skin: Skin is warm and dry. No rash noted. No erythema. No pallor.    ED Course  Procedures  Results for orders placed during the hospital encounter of 05/16/11  URINALYSIS, ROUTINE W REFLEX MICROSCOPIC      Component Value Range   Color, Urine YELLOW  YELLOW    APPearance CLEAR  CLEAR    Specific Gravity, Urine 1.007  1.005 - 1.030    pH 5.5  5.0 - 8.0    Glucose, UA NEGATIVE  NEGATIVE (mg/dL)   Hgb urine dipstick NEGATIVE  NEGATIVE    Bilirubin Urine NEGATIVE  NEGATIVE    Ketones, ur NEGATIVE  NEGATIVE (mg/dL)   Protein, ur NEGATIVE  NEGATIVE (mg/dL)   Urobilinogen, UA 0.2  0.0 - 1.0 (mg/dL)   Nitrite NEGATIVE  NEGATIVE    Leukocytes, UA TRACE (*) NEGATIVE   URINE MICROSCOPIC-ADD ON      Component Value Range   Squamous Epithelial / LPF RARE  RARE    WBC, UA 3-6  <3 (WBC/hpf)   Bacteria, UA RARE  RARE   CBC      Component Value Range   WBC 9.9  4.0 - 10.5 (K/uL)   RBC 4.48  3.87 - 5.11 (MIL/uL)   Hemoglobin 13.0  12.0 - 15.0 (g/dL)   HCT 16.1  09.6 - 04.5 (%)   MCV 86.4  78.0 - 100.0 (fL)   MCH 29.0  26.0 - 34.0 (pg)   MCHC 33.6  30.0 - 36.0 (g/dL)   RDW 40.9  81.1 - 91.4 (%)   Platelets 230  150 - 400 (K/uL)  DIFFERENTIAL      Component Value Range   Neutrophils Relative 43  43 - 77 (%)   Neutro Abs 4.3  1.7 - 7.7 (K/uL)   Lymphocytes Relative 49 (*) 12 - 46 (%)   Lymphs Abs 4.8 (*) 0.7 - 4.0 (K/uL)   Monocytes Relative 6  3 - 12 (%)   Monocytes Absolute 0.6  0.1 - 1.0 (K/uL)   Eosinophils Relative 2  0 - 5 (%)     Eosinophils Absolute 0.2  0.0 - 0.7 (K/uL)   Basophils Relative 0  0 - 1 (%)   Basophils Absolute 0.0  0.0 - 0.1 (K/uL)  BASIC METABOLIC PANEL      Component Value Range   Sodium 138  135 - 145 (mEq/L)   Potassium  4.0  3.5 - 5.1 (mEq/L)   Chloride 103  96 - 112 (mEq/L)   CO2 25  19 - 32 (mEq/L)   Glucose, Bld 91  70 - 99 (mg/dL)   BUN 11  6 - 23 (mg/dL)   Creatinine, Ser 1.61  0.50 - 1.10 (mg/dL)   Calcium 9.6  8.4 - 09.6 (mg/dL)   GFR calc non Af Amer >90  >90 (mL/min)   GFR calc Af Amer >90  >90 (mL/min)   No results found.   MDM    Negative labs will place patient on Cipro and pain medication to treat symptoms. Advised to have ordered a urine culture. Patient agrees to plan and is ready for discharge  Medical screening examination/treatment/procedure(s) were performed by non-physician practitioner and as supervising physician I was immediately available for consultation/collaboration. Osvaldo Human, M.D.     Thomasene Lot, PA-C 05/16/11 1959  Carleene Cooper III, MD 05/17/11 [phone]

## 2011-05-20 NOTE — ED Notes (Signed)
Chart sent EDP office for review 1/20

## 2011-05-21 NOTE — ED Notes (Addendum)
Keflex 500 mg TID for 7 days.Patient needs to follow-up with PCP/OBGYN for recheck of urine to ensure resolution of symptoms/infection written by Rhea Bleacher.Patient informed of positive results and wants rx called to [facility]-[phone]

## 2011-07-21 ENCOUNTER — Encounter (HOSPITAL_BASED_OUTPATIENT_CLINIC_OR_DEPARTMENT_OTHER): Payer: Self-pay | Admitting: *Deleted

## 2011-07-21 ENCOUNTER — Emergency Department (HOSPITAL_BASED_OUTPATIENT_CLINIC_OR_DEPARTMENT_OTHER)
Admission: EM | Admit: 2011-07-21 | Discharge: 2011-07-21 | Disposition: A | Discharge status: Home / Self Care | Payer: Self-pay | Attending: Emergency Medicine | Admitting: Emergency Medicine

## 2011-07-21 DIAGNOSIS — K0889 Other specified disorders of teeth and supporting structures: Secondary | ICD-10-CM

## 2011-07-21 DIAGNOSIS — K089 Disorder of teeth and supporting structures, unspecified: Secondary | ICD-10-CM | POA: Insufficient documentation

## 2011-07-21 DIAGNOSIS — Z87891 Personal history of nicotine dependence: Secondary | ICD-10-CM | POA: Insufficient documentation

## 2011-07-21 DIAGNOSIS — E119 Type 2 diabetes mellitus without complications: Secondary | ICD-10-CM | POA: Insufficient documentation

## 2011-07-21 MED ORDER — OXYCODONE-ACETAMINOPHEN 5-325 MG PO TABS: 1.0000 | ORAL_TABLET | ORAL | Status: AC | PRN

## 2011-07-21 MED ORDER — PENICILLIN V POTASSIUM 500 MG PO TABS: 500.0000 mg | ORAL_TABLET | Freq: Three times a day (TID) | ORAL | Status: AC

## 2011-07-21 NOTE — Discharge Instructions (Signed)
Dental Pain  A tooth ache may be caused by cavities (tooth decay). Cavities expose the nerve of the tooth to air and hot or cold temperatures. It may come from an infection or abscess (also called a boil or furuncle) around your tooth. It is also often caused by dental caries (tooth decay). This causes the pain you are having.  DIAGNOSIS    Your caregiver can diagnose this problem by exam.  TREATMENT     If caused by an infection, it may be treated with medications which kill germs (antibiotics) and pain medications as prescribed by your caregiver. Take medications as directed.    Only take over-the-counter or prescription medicines for pain, discomfort, or fever as directed by your caregiver.    Whether the tooth ache today is caused by infection or dental disease, you should see your dentist as soon as possible for further care.   SEEK MEDICAL CARE IF:  The exam and treatment you received today has been provided on an emergency basis only. This is not a substitute for complete medical or dental care. If your problem worsens or new problems (symptoms) appear, and you are unable to meet with your dentist, call or return to this location.  SEEK IMMEDIATE MEDICAL CARE IF:     You have a fever.    You develop redness and swelling of your face, jaw, or neck.    You are unable to open your mouth.    You have severe pain uncontrolled by pain medicine.   MAKE SURE YOU:     Understand these instructions.    Will watch your condition.    Will get help right away if you are not doing well or get worse.   Document Released: 04/16/2005 Document Revised: 04/05/2011 Document Reviewed: 12/03/2007  [employer] Patient Information 2012 [employer]    Toothache  Toothaches are usually caused by tooth decay (cavity). However, other causes of toothache include:   Gum disease.    Cracked tooth.    Cracked filling.    Injury.    Jaw problem (temporo mandibular joint or TMJ disorder).    Tooth abscess.     Root sensitivity.    Grinding.    Eruption problems.   Swelling and redness around a painful tooth often means you have a dental abscess.  Pain medicine and antibiotics can help reduce symptoms, but you will need to see a dentist within the next few days to have your problem properly evaluated and treated. If tooth decay is the problem, you may need a filling or root canal to save your tooth. If the problem is more severe, your tooth may need to be pulled.  SEEK IMMEDIATE MEDICAL CARE IF:   You cannot swallow.    You develop severe swelling, increased redness, or increased pain in your mouth or face.    You have a fever.    You cannot open your mouth adequately.   Document Released: 05/24/2004 Document Revised: 04/05/2011 Document Reviewed: 07/14/2009  [employer] Patient Information 2012 [employer].

## 2011-07-21 NOTE — ED Provider Notes (Signed)
History     CSN: [id number]  Arrival date & time 07/21/11  4098   First MD Initiated Contact with Patient 07/21/11 0700      Chief Complaint  Patient presents with  . Dental Pain    (Consider location/radiation/quality/duration/timing/severity/associated sxs/prior treatment) Patient is a 46 y.o. female presenting with tooth pain. The history is provided by the patient.  Dental PainThe symptoms began more than 1 week ago. The symptoms are worsening. The symptoms are chronic. The symptoms occur constantly.  Additional symptoms include: jaw pain. Additional symptoms do not include: gum swelling, gum tenderness, facial swelling, drooling and ear pain. Medical issues do not include: alcohol problem and smoking.    Past Medical History  Diagnosis Date  . Diabetes mellitus     Past Surgical History  Procedure Date  . Hammer toe surgery   . Lipoma excision   . Knee arthrocentesis   . Foot fasciotomy   . Endometrial ablation w/ novasure   . Back surgery   . Lipoma removed     Family History  Problem Relation Age of Onset  . Stroke Mother   . Diabetes Mother   . Hypertension Mother   . Stroke Father   . Sudden death Father   . Hypertension Father   . Hyperlipidemia Father   . Heart attack Father   . Diabetes Father   . Diabetes Maternal Grandmother   . Heart attack Maternal Grandmother   . Hypertension Maternal Grandmother   . Diabetes Paternal Grandmother   . Sudden death Paternal Grandmother     History  Substance Use Topics  . Smoking status: Former Games developer  . Smokeless tobacco: Not on file  . Alcohol Use: No    OB History    Grav Para Term Preterm Abortions TAB SAB Ect Mult Living                  Review of Systems  HENT: Negative for ear pain, facial swelling and drooling.   All other systems reviewed and are negative.    Allergies  Review of patient's allergies indicates no known allergies.  Home Medications   Current Outpatient Rx  Name Route  Sig Dispense Refill  . IBUPROFEN 200 MG PO TABS Oral Take 400 mg by mouth every 6 (six) hours as needed. For fever/pain    . METFORMIN HCL 1000 MG PO TABS Oral Take 1,000 mg by mouth 2 (two) times daily with a meal.      . ADVANCED DIABETIC MULTIVITAMIN PO Oral Take 1 packet by mouth daily.      Marland Kitchen METHENAMINE-SODIUM SALICYLATE 162-162.5 MG PO TABS Oral Take 2 tablets by mouth 3 (three) times daily as needed. For urinary tract infection    . OVER THE COUNTER MEDICATION Oral Take 1 tablet by mouth daily. AZO Yeast      BP 139/95  Pulse 80  Temp(Src) 98.1 F (36.7 C) (Oral)  Resp 18  Ht 5\' 2"  (1.575 m)  Wt 189 lb (85.73 kg)  BMI 34.57 kg/m2  SpO2 100%  Physical Exam  Nursing note and vitals reviewed. Constitutional: She is oriented to person, place, and time. She appears well-developed and well-nourished.  HENT:  Head: Normocephalic and atraumatic.       Generalized poor dentition with right lower third molar with caries, no second or first molar, diffuse tenderness over lateral incisor, and bicuspid, no gum swelling  Eyes: Pupils are equal, round, and reactive to light.  Neck: Normal range of motion.  Cardiovascular: Normal rate and regular rhythm.   Pulmonary/Chest: Effort normal and breath sounds normal.  Abdominal: Soft.  Musculoskeletal: Normal range of motion.  Neurological: She is alert and oriented to person, place, and time. She has normal reflexes.  Skin: Skin is warm and dry.  Psychiatric: She has a normal mood and affect.    ED Course  Procedures (including critical care time)  Labs Reviewed - No data to display No results found.   No diagnosis found.    MDM          Hilario Quarry, MD 07/21/11 [phone]

## 2011-07-21 NOTE — ED Notes (Signed)
Pt c/o right lower molar toothache x2 weeks. Worsened considerably last night. Taking ibuprofen without relief. Hx dental abscesses. Does not have a dentist to f/u with.

## 2011-11-08 ENCOUNTER — Emergency Department (HOSPITAL_BASED_OUTPATIENT_CLINIC_OR_DEPARTMENT_OTHER)
Admission: EM | Admit: 2011-11-08 | Discharge: 2011-11-08 | Disposition: A | Discharge status: Home / Self Care | Payer: Self-pay | Attending: Emergency Medicine | Admitting: Emergency Medicine

## 2011-11-08 ENCOUNTER — Encounter (HOSPITAL_BASED_OUTPATIENT_CLINIC_OR_DEPARTMENT_OTHER): Payer: Self-pay

## 2011-11-08 DIAGNOSIS — E119 Type 2 diabetes mellitus without complications: Secondary | ICD-10-CM | POA: Insufficient documentation

## 2011-11-08 DIAGNOSIS — S025XXA Fracture of tooth (traumatic), initial encounter for closed fracture: Secondary | ICD-10-CM | POA: Insufficient documentation

## 2011-11-08 DIAGNOSIS — K0889 Other specified disorders of teeth and supporting structures: Secondary | ICD-10-CM

## 2011-11-08 DIAGNOSIS — Z87891 Personal history of nicotine dependence: Secondary | ICD-10-CM | POA: Insufficient documentation

## 2011-11-08 DIAGNOSIS — X58XXXA Exposure to other specified factors, initial encounter: Secondary | ICD-10-CM | POA: Insufficient documentation

## 2011-11-08 HISTORY — DX: Unspecified asthma, uncomplicated: J45.909

## 2011-11-08 MED ORDER — HYDROCODONE-ACETAMINOPHEN 5-325 MG PO TABS: 2.0000 | ORAL_TABLET | ORAL | Status: AC | PRN

## 2011-11-08 MED ORDER — CLINDAMYCIN HCL 150 MG PO CAPS: 150.0000 mg | ORAL_CAPSULE | Freq: Four times a day (QID) | ORAL | Status: AC

## 2011-11-08 NOTE — ED Notes (Signed)
Pt reports dental pain x 4 months.

## 2011-11-08 NOTE — ED Provider Notes (Signed)
Medical screening examination/treatment/procedure(s) were performed by non-physician practitioner and as supervising physician I was immediately available for consultation/collaboration.  Shelda Jakes, MD 11/08/11 [phone]

## 2011-11-08 NOTE — ED Provider Notes (Signed)
History     CSN: [id number]  Arrival date & time 11/08/11  1443   First MD Initiated Contact with Patient 11/08/11 1513      Chief Complaint  Patient presents with  . Dental Pain    (Consider location/radiation/quality/duration/timing/severity/associated sxs/prior treatment) Patient is a 46 y.o. female presenting with tooth pain. The history is provided by the patient. No language interpreter was used.  Dental PainThe primary symptoms include mouth pain. The symptoms began more than 1 month ago. The symptoms are worsening. The symptoms are recurrent. The symptoms occur constantly.  Additional symptoms include: gum swelling.  Pt complains of a toothache for 4 months  Past Medical History  Diagnosis Date  . Diabetes mellitus   . Asthma     Past Surgical History  Procedure Date  . Hammer toe surgery   . Lipoma excision   . Knee arthrocentesis   . Foot fasciotomy   . Endometrial ablation w/ novasure   . Back surgery   . Lipoma removed     Family History  Problem Relation Age of Onset  . Stroke Mother   . Diabetes Mother   . Hypertension Mother   . Stroke Father   . Sudden death Father   . Hypertension Father   . Hyperlipidemia Father   . Heart attack Father   . Diabetes Father   . Diabetes Maternal Grandmother   . Heart attack Maternal Grandmother   . Hypertension Maternal Grandmother   . Diabetes Paternal Grandmother   . Sudden death Paternal Grandmother     History  Substance Use Topics  . Smoking status: Former Games developer  . Smokeless tobacco: Not on file  . Alcohol Use: No    OB History    Grav Para Term Preterm Abortions TAB SAB Ect Mult Living                  Review of Systems  HENT: Positive for dental problem.   All other systems reviewed and are negative.    Allergies  Review of patient's allergies indicates no known allergies.  Home Medications   Current Outpatient Rx  Name Route Sig Dispense Refill  . IBUPROFEN 200 MG PO TABS Oral  Take 400 mg by mouth every 6 (six) hours as needed. For fever/pain    . METFORMIN HCL 1000 MG PO TABS Oral Take 1,000 mg by mouth 2 (two) times daily with a meal.      . METHENAMINE-SODIUM SALICYLATE 162-162.5 MG PO TABS Oral Take 2 tablets by mouth 3 (three) times daily as needed. For urinary tract infection    . ADVANCED DIABETIC MULTIVITAMIN PO Oral Take 1 packet by mouth daily.      Marland Kitchen OVER THE COUNTER MEDICATION Oral Take 1 tablet by mouth daily. AZO Yeast      BP 142/91  Pulse 95  Temp 98.6 F (37 C) (Oral)  Resp 18  Ht 5\' 2"  (1.575 m)  Wt 190 lb (86.183 kg)  BMI 34.75 kg/m2  SpO2 100%  Physical Exam  Nursing note and vitals reviewed. Constitutional: She appears well-developed and well-nourished.  HENT:  Head: Normocephalic and atraumatic.       Broken right lower 2nd molar,  Eyes: Conjunctivae are normal. Pupils are equal, round, and reactive to light.  Cardiovascular: Normal rate and regular rhythm.   Pulmonary/Chest: Effort normal.  Musculoskeletal: Normal range of motion.  Neurological: She is alert.  Skin: Skin is warm.    ED Course  Procedures (including critical  care time)  Labs Reviewed - No data to display No results found.   No diagnosis found.    MDM  Clindamycin, hydrocodone. Schedule to see Dr. Burgess Estelle for appointment.  Call today        Lonia Skinner [place], [place] 11/08/11 [phone]

## 2011-12-19 ENCOUNTER — Emergency Department (HOSPITAL_BASED_OUTPATIENT_CLINIC_OR_DEPARTMENT_OTHER)
Admission: EM | Admit: 2011-12-19 | Discharge: 2011-12-19 | Disposition: A | Discharge status: Home / Self Care | Payer: Self-pay | Attending: Emergency Medicine | Admitting: Emergency Medicine

## 2011-12-19 ENCOUNTER — Emergency Department (HOSPITAL_BASED_OUTPATIENT_CLINIC_OR_DEPARTMENT_OTHER): Payer: Self-pay

## 2011-12-19 ENCOUNTER — Encounter (HOSPITAL_BASED_OUTPATIENT_CLINIC_OR_DEPARTMENT_OTHER): Payer: Self-pay | Admitting: *Deleted

## 2011-12-19 DIAGNOSIS — E119 Type 2 diabetes mellitus without complications: Secondary | ICD-10-CM | POA: Insufficient documentation

## 2011-12-19 DIAGNOSIS — M25473 Effusion, unspecified ankle: Secondary | ICD-10-CM | POA: Insufficient documentation

## 2011-12-19 DIAGNOSIS — M25579 Pain in unspecified ankle and joints of unspecified foot: Secondary | ICD-10-CM

## 2011-12-19 DIAGNOSIS — M79669 Pain in unspecified lower leg: Secondary | ICD-10-CM

## 2011-12-19 DIAGNOSIS — J45909 Unspecified asthma, uncomplicated: Secondary | ICD-10-CM | POA: Insufficient documentation

## 2011-12-19 DIAGNOSIS — M25476 Effusion, unspecified foot: Secondary | ICD-10-CM | POA: Insufficient documentation

## 2011-12-19 DIAGNOSIS — F17200 Nicotine dependence, unspecified, uncomplicated: Secondary | ICD-10-CM | POA: Insufficient documentation

## 2011-12-19 DIAGNOSIS — M7120 Synovial cyst of popliteal space [Baker], unspecified knee: Secondary | ICD-10-CM | POA: Insufficient documentation

## 2011-12-19 HISTORY — DX: Other fracture of right lower leg, initial encounter for closed fracture: S82.891A

## 2011-12-19 MED ORDER — CYCLOBENZAPRINE HCL 10 MG PO TABS: 10.0000 mg | ORAL_TABLET | Freq: Three times a day (TID) | ORAL | Status: AC | PRN

## 2011-12-19 NOTE — ED Notes (Signed)
Pt reports she broke her right ankle 1 year ago- has had intermittent swelling- today leg is swollen- pt wearing ASO

## 2011-12-19 NOTE — ED Provider Notes (Signed)
History     CSN: [id number]  Arrival date & time 12/19/11  1654   First MD Initiated Contact with Patient 12/19/11 1754      Chief Complaint  Patient presents with  . Leg Swelling    (Consider location/radiation/quality/duration/timing/severity/associated sxs/prior treatment) HPI Comments: Patient with history of fracture to right ankle one year ago.  Has been having intermittent pain and swelling to same.  The past few days the ankle has been more swollen and painful and the pain is now extending into the calf.  There is no chest pain or shortness of breath.  She has twisted the ankle a couple times since the original injury one year ago.  The history is provided by the patient.    Past Medical History  Diagnosis Date  . Diabetes mellitus   . Asthma   . Ankle fracture, right     Past Surgical History  Procedure Date  . Hammer toe surgery   . Lipoma excision   . Knee arthrocentesis   . Foot fasciotomy   . Endometrial ablation w/ novasure   . Back surgery   . Lipoma removed     Family History  Problem Relation Age of Onset  . Stroke Mother   . Diabetes Mother   . Hypertension Mother   . Stroke Father   . Sudden death Father   . Hypertension Father   . Hyperlipidemia Father   . Heart attack Father   . Diabetes Father   . Diabetes Maternal Grandmother   . Heart attack Maternal Grandmother   . Hypertension Maternal Grandmother   . Diabetes Paternal Grandmother   . Sudden death Paternal Grandmother     History  Substance Use Topics  . Smoking status: Current Everyday Smoker  . Smokeless tobacco: Never Used  . Alcohol Use: No    OB History    Grav Para Term Preterm Abortions TAB SAB Ect Mult Living                  Review of Systems  All other systems reviewed and are negative.    Allergies  Review of patient's allergies indicates no known allergies.  Home Medications   Current Outpatient Rx  Name Route Sig Dispense Refill  .  HYDROCODONE-ACETAMINOPHEN 5-500 MG PO TABS Oral Take 1 tablet by mouth every 6 (six) hours as needed. For pain.    Marland Kitchen METFORMIN HCL 1000 MG PO TABS Oral Take 1,000 mg by mouth 2 (two) times daily with a meal.      . METHENAMINE-SODIUM SALICYLATE 162-162.5 MG PO TABS Oral Take 2 tablets by mouth 3 (three) times daily as needed. For urinary tract infection      BP 137/85  Pulse 81  Temp 99 F (37.2 C) (Oral)  Resp 18  Ht 5\' 2"  (1.575 m)  Wt 189 lb (85.73 kg)  BMI 34.57 kg/m2  SpO2 100%  Physical Exam  Nursing note and vitals reviewed. Constitutional: She is oriented to person, place, and time. She appears well-developed and well-nourished. No distress.  HENT:  Head: Normocephalic and atraumatic.  Neck: Normal range of motion. Neck supple.  Musculoskeletal:       The right ankle is noted to be mildly swollen and ttp over the lateral malleolus.  There is also mild calf ttp but no edema of the leg distally.  Homans sign is equivocal.  Distal pulses are easily palpable.  Neurological: She is alert and oriented to person, place, and time.  Skin:  Skin is warm. She is not diaphoretic.    ED Course  Procedures (including critical care time)  Labs Reviewed - No data to display No results found.   No diagnosis found.    MDM  The ultrasound and xrays are negative for dvt and fracture, however a Baker's cyst was identified.  She will be treated conservatively, to follow up with ortho if not improving.        [name], MD 12/19/11 1946

## 2012-06-04 DIAGNOSIS — F259 Schizoaffective disorder, unspecified: Secondary | ICD-10-CM | POA: Insufficient documentation

## 2012-09-22 ENCOUNTER — Encounter (HOSPITAL_BASED_OUTPATIENT_CLINIC_OR_DEPARTMENT_OTHER): Payer: Self-pay | Admitting: *Deleted

## 2012-09-22 ENCOUNTER — Emergency Department (HOSPITAL_BASED_OUTPATIENT_CLINIC_OR_DEPARTMENT_OTHER)
Admission: EM | Admit: 2012-09-22 | Discharge: 2012-09-22 | Disposition: A | Discharge status: Home / Self Care | Payer: Self-pay | Attending: Emergency Medicine | Admitting: Emergency Medicine

## 2012-09-22 DIAGNOSIS — R739 Hyperglycemia, unspecified: Secondary | ICD-10-CM

## 2012-09-22 DIAGNOSIS — Z79899 Other long term (current) drug therapy: Secondary | ICD-10-CM | POA: Insufficient documentation

## 2012-09-22 DIAGNOSIS — F17200 Nicotine dependence, unspecified, uncomplicated: Secondary | ICD-10-CM | POA: Insufficient documentation

## 2012-09-22 DIAGNOSIS — Z8781 Personal history of (healed) traumatic fracture: Secondary | ICD-10-CM | POA: Insufficient documentation

## 2012-09-22 DIAGNOSIS — E1169 Type 2 diabetes mellitus with other specified complication: Secondary | ICD-10-CM | POA: Insufficient documentation

## 2012-09-22 DIAGNOSIS — J45909 Unspecified asthma, uncomplicated: Secondary | ICD-10-CM | POA: Insufficient documentation

## 2012-09-22 LAB — GLUCOSE, CAPILLARY

## 2012-09-22 MED ORDER — METFORMIN HCL 1000 MG PO TABS: 1000.0000 mg | ORAL_TABLET | Freq: Two times a day (BID) | ORAL | Status: DC

## 2012-09-22 MED ORDER — GLIPIZIDE 5 MG PO TABS: 5.0000 mg | ORAL_TABLET | Freq: Two times a day (BID) | ORAL | Status: DC

## 2012-09-22 NOTE — ED Provider Notes (Signed)
History     CSN: [id number]  Arrival date & time 09/22/12  1334   First MD Initiated Contact with Patient 09/22/12 1423      Chief Complaint  Patient presents with  . Hyperglycemia    (Consider location/radiation/quality/duration/timing/severity/associated sxs/prior treatment) HPI Comments: Patient with history of NIDDM.  Ran out of medications this week and has not been on metformin and glipizide for the past week.  She has been getting high sugar readings at home.  No fevers or chills.  No n/v/d.  No other complaints.    Patient is a 47 y.o. female presenting with hyperglycemia. The history is provided by the patient.  Hyperglycemia Severity:  Moderate Onset quality:  Gradual Duration:  1 week Timing:  Constant Progression:  Worsening Chronicity:  New Diabetes status:  Controlled with oral medications Context: change in medication     Past Medical History  Diagnosis Date  . Diabetes mellitus   . Asthma   . Ankle fracture, right     Past Surgical History  Procedure Laterality Date  . Hammer toe surgery    . Lipoma excision    . Knee arthrocentesis    . Foot fasciotomy    . Endometrial ablation w/ novasure    . Back surgery    . Lipoma removed      Family History  Problem Relation Age of Onset  . Stroke Mother   . Diabetes Mother   . Hypertension Mother   . Stroke Father   . Sudden death Father   . Hypertension Father   . Hyperlipidemia Father   . Heart attack Father   . Diabetes Father   . Diabetes Maternal Grandmother   . Heart attack Maternal Grandmother   . Hypertension Maternal Grandmother   . Diabetes Paternal Grandmother   . Sudden death Paternal Grandmother     History  Substance Use Topics  . Smoking status: Current Every Day Smoker  . Smokeless tobacco: Never Used  . Alcohol Use: No    OB History   Grav Para Term Preterm Abortions TAB SAB Ect Mult Living                  Review of Systems  All other systems reviewed and are  negative.    Allergies  Review of patient's allergies indicates no known allergies.  Home Medications   Current Outpatient Rx  Name  Route  Sig  Dispense  Refill  . HYDROcodone-acetaminophen (VICODIN) 5-500 MG per tablet   Oral   Take 1 tablet by mouth every 6 (six) hours as needed. For pain.         . metFORMIN (GLUCOPHAGE) 1000 MG tablet   Oral   Take 1,000 mg by mouth 2 (two) times daily with a meal.           . Methenamine-Sodium Salicylate (CYSTEX) 162-162.5 MG TABS   Oral   Take 2 tablets by mouth 3 (three) times daily as needed. For urinary tract infection           BP 133/80  Pulse 83  Temp(Src) 98.5 F (36.9 C) (Oral)  Resp 18  Wt 194 lb (87.998 kg)  BMI 35.47 kg/m2  SpO2 96%  Physical Exam  Nursing note and vitals reviewed. Constitutional: She is oriented to person, place, and time. She appears well-developed and well-nourished. No distress.  HENT:  Head: Normocephalic and atraumatic.  Mouth/Throat: Oropharynx is clear and moist.  Neck: Normal range of motion. Neck supple.  Cardiovascular: Normal rate, regular rhythm and normal heart sounds.   No murmur heard. Pulmonary/Chest: Effort normal and breath sounds normal. No respiratory distress. She has no wheezes.  Abdominal: Soft. Bowel sounds are normal.  Musculoskeletal: Normal range of motion.  Neurological: She is alert and oriented to person, place, and time.  Skin: Skin is warm and dry. She is not diaphoretic.    ED Course  Procedures (including critical care time)  Labs Reviewed  GLUCOSE, CAPILLARY - Abnormal; Notable for the following:    Glucose-Capillary 192 (*)    All other components within normal limits   No results found.   No diagnosis found.    MDM  Sugar on arrival 192.  Will discharge with refills for meds.  Does not appear acutely ill or toxic and no further workup seems appropriate.        [name], MD 09/22/12 1432

## 2012-09-22 NOTE — ED Notes (Signed)
Ran out of Metformin and Glipizide for diabetes last week. Today her sugar read high on her machine. Blurred vision, urinary frequency and lethargy since Friday.

## 2012-12-02 ENCOUNTER — Emergency Department (HOSPITAL_BASED_OUTPATIENT_CLINIC_OR_DEPARTMENT_OTHER): Payer: Commercial Managed Care - PPO

## 2012-12-02 ENCOUNTER — Emergency Department (HOSPITAL_BASED_OUTPATIENT_CLINIC_OR_DEPARTMENT_OTHER)
Admission: EM | Admit: 2012-12-02 | Discharge: 2012-12-02 | Disposition: A | Discharge status: Home / Self Care | Payer: Commercial Managed Care - PPO | Attending: Emergency Medicine | Admitting: Emergency Medicine

## 2012-12-02 ENCOUNTER — Encounter (HOSPITAL_BASED_OUTPATIENT_CLINIC_OR_DEPARTMENT_OTHER): Payer: Self-pay | Admitting: *Deleted

## 2012-12-02 DIAGNOSIS — R109 Unspecified abdominal pain: Secondary | ICD-10-CM | POA: Insufficient documentation

## 2012-12-02 DIAGNOSIS — J45909 Unspecified asthma, uncomplicated: Secondary | ICD-10-CM | POA: Insufficient documentation

## 2012-12-02 DIAGNOSIS — Z79899 Other long term (current) drug therapy: Secondary | ICD-10-CM | POA: Insufficient documentation

## 2012-12-02 DIAGNOSIS — Z87828 Personal history of other (healed) physical injury and trauma: Secondary | ICD-10-CM | POA: Insufficient documentation

## 2012-12-02 DIAGNOSIS — E119 Type 2 diabetes mellitus without complications: Secondary | ICD-10-CM | POA: Insufficient documentation

## 2012-12-02 DIAGNOSIS — F17200 Nicotine dependence, unspecified, uncomplicated: Secondary | ICD-10-CM | POA: Insufficient documentation

## 2012-12-02 DIAGNOSIS — A599 Trichomoniasis, unspecified: Secondary | ICD-10-CM

## 2012-12-02 DIAGNOSIS — N390 Urinary tract infection, site not specified: Secondary | ICD-10-CM | POA: Insufficient documentation

## 2012-12-02 LAB — URINALYSIS, ROUTINE W REFLEX MICROSCOPIC
Glucose, UA: 100 mg/dL — AB
Ketones, ur: NEGATIVE mg/dL
Nitrite: NEGATIVE
Protein, ur: NEGATIVE mg/dL
pH: 5.5 (ref 5.0–8.0)

## 2012-12-02 LAB — CBC
HCT: 36.3 % (ref 36.0–46.0)
Hemoglobin: 12.1 g/dL (ref 12.0–15.0)
MCH: 29.3 pg (ref 26.0–34.0)
MCV: 87.9 fL (ref 78.0–100.0)
Platelets: 228 10*3/uL (ref 150–400)
RBC: 4.13 MIL/uL (ref 3.87–5.11)
WBC: 7.7 10*3/uL (ref 4.0–10.5)

## 2012-12-02 LAB — BASIC METABOLIC PANEL
BUN: 15 mg/dL (ref 6–23)
CO2: 26 mEq/L (ref 19–32)
Calcium: 9.8 mg/dL (ref 8.4–10.5)
Chloride: 104 mEq/L (ref 96–112)
Creatinine, Ser: 0.7 mg/dL (ref 0.50–1.10)
Glucose, Bld: 188 mg/dL — ABNORMAL HIGH (ref 70–99)

## 2012-12-02 LAB — URINE MICROSCOPIC-ADD ON

## 2012-12-02 LAB — WET PREP, GENITAL

## 2012-12-02 MED ORDER — CEFTRIAXONE SODIUM 250 MG IJ SOLR
250.0000 mg | Freq: Once | INTRAMUSCULAR | Status: AC
  Administered 2012-12-02: 250 mg via INTRAMUSCULAR
  Filled 2012-12-02: qty 250

## 2012-12-02 MED ORDER — METRONIDAZOLE 500 MG PO TABS: 500.0000 mg | ORAL_TABLET | Freq: Two times a day (BID) | ORAL | Status: DC

## 2012-12-02 MED ORDER — AZITHROMYCIN 250 MG PO TABS
1000.0000 mg | ORAL_TABLET | Freq: Once | ORAL | Status: AC
  Administered 2012-12-02: 1000 mg via ORAL
  Filled 2012-12-02: qty 4

## 2012-12-02 MED ORDER — HYDROCODONE-ACETAMINOPHEN 5-325 MG PO TABS: 2.0000 | ORAL_TABLET | ORAL | Status: DC | PRN

## 2012-12-02 NOTE — ED Notes (Signed)
Patient transported to ultrasound via stretcher per tech. 

## 2012-12-02 NOTE — ED Provider Notes (Signed)
CSN: [id number]     Arrival date & time 12/02/12  1403 History     First MD Initiated Contact with Patient 12/02/12 1427     Chief Complaint  Patient presents with  . Abdominal Pain   (Consider location/radiation/quality/duration/timing/severity/associated sxs/prior Treatment) HPI Comments: Pt states that she was treated for uti about 2 weeks ago and the symptoms got slightly better and now the symptoms have returned and she is having a vaginal discharge:pt states that she has had intermittent fever  Patient is a 47 y.o. female presenting with abdominal pain. The history is provided by the patient. No language interpreter was used.  Abdominal Pain Pain location:  RLQ and LLQ Pain quality: aching   Pain radiates to:  Does not radiate Pain severity:  Moderate Onset quality:  Gradual Timing:  Intermittent Progression:  Worsening Context: recent sexual activity   Relieved by:  Nothing   Past Medical History  Diagnosis Date  . Diabetes mellitus   . Asthma   . Ankle fracture, right    Past Surgical History  Procedure Laterality Date  . Hammer toe surgery    . Lipoma excision    . Knee arthrocentesis    . Foot fasciotomy    . Endometrial ablation w/ novasure    . Back surgery    . Lipoma removed     Family History  Problem Relation Age of Onset  . Stroke Mother   . Diabetes Mother   . Hypertension Mother   . Stroke Father   . Sudden death Father   . Hypertension Father   . Hyperlipidemia Father   . Heart attack Father   . Diabetes Father   . Diabetes Maternal Grandmother   . Heart attack Maternal Grandmother   . Hypertension Maternal Grandmother   . Diabetes Paternal Grandmother   . Sudden death Paternal Grandmother    History  Substance Use Topics  . Smoking status: Current Every Day Smoker  . Smokeless tobacco: Never Used  . Alcohol Use: No   OB History   Grav Para Term Preterm Abortions TAB SAB Ect Mult Living                 Review of Systems   Constitutional: Negative.   Respiratory: Negative.   Cardiovascular: Negative.   Gastrointestinal: Positive for abdominal pain.    Allergies  Review of patient's allergies indicates no known allergies.  Home Medications   Current Outpatient Rx  Name  Route  Sig  Dispense  Refill  . glipiZIDE (GLUCOTROL) 5 MG tablet   Oral   Take 1 tablet (5 mg total) by mouth 2 (two) times daily before a meal.   60 tablet   1   . HYDROcodone-acetaminophen (VICODIN) 5-500 MG per tablet   Oral   Take 1 tablet by mouth every 6 (six) hours as needed. For pain.         . metFORMIN (GLUCOPHAGE) 1000 MG tablet   Oral   Take 1,000 mg by mouth 2 (two) times daily with a meal.           . metFORMIN (GLUCOPHAGE) 1000 MG tablet   Oral   Take 1 tablet (1,000 mg total) by mouth 2 (two) times daily.   60 tablet   1   . Methenamine-Sodium Salicylate (CYSTEX) 162-162.5 MG TABS   Oral   Take 2 tablets by mouth 3 (three) times daily as needed. For urinary tract infection  BP 134/74  Pulse 89  Temp(Src) 98.3 F (36.8 C) (Oral)  Resp 20  Ht 5\' 2"  (1.575 m)  Wt 193 lb (87.544 kg)  BMI 35.29 kg/m2  SpO2 98% Physical Exam  Vitals reviewed. Constitutional: She is oriented to person, place, and time. She appears well-developed and well-nourished.  HENT:  Head: Normocephalic and atraumatic.  Eyes: Conjunctivae and EOM are normal.  Neck: Normal range of motion. Neck supple.  Cardiovascular: Normal rate and regular rhythm.   Pulmonary/Chest: Effort normal and breath sounds normal.  Abdominal: Soft. Bowel sounds are normal. There is tenderness in the suprapubic area.  Genitourinary:  Yellow discharge:left adnexal tenderness  Musculoskeletal: Normal range of motion.  Neurological: She is alert and oriented to person, place, and time.  Skin: Skin is warm and dry.  Psychiatric: She has a normal mood and affect.    ED Course   Procedures (including critical care time)  Labs Reviewed   WET PREP, GENITAL - Abnormal; Notable for the following:    Trich, Wet Prep MANY (*)    Clue Cells Wet Prep HPF POC MODERATE (*)    WBC, Wet Prep HPF POC MODERATE (*)    All other components within normal limits  URINALYSIS, ROUTINE W REFLEX MICROSCOPIC - Abnormal; Notable for the following:    APPearance CLOUDY (*)    Glucose, UA 100 (*)    Leukocytes, UA SMALL (*)    All other components within normal limits  URINE MICROSCOPIC-ADD ON - Abnormal; Notable for the following:    Squamous Epithelial / LPF FEW (*)    Bacteria, UA FEW (*)    All other components within normal limits  BASIC METABOLIC PANEL - Abnormal; Notable for the following:    Glucose, Bld 188 (*)    All other components within normal limits  URINE CULTURE  GC/CHLAMYDIA PROBE AMP  CBC   US Transvaginal Non-ob  12/02/2012   *RADIOLOGY REPORT*  Clinical Data: Right lower quadrant pelvic pain  TRANSABDOMINAL AND TRANSVAGINAL ULTRASOUND OF PELVIS  Technique:  Both transabdominal and transvaginal ultrasound examinations of the pelvis were performed.  Transabdominal technique was performed for global imaging of the pelvis including uterus, ovaries, adnexal regions, and pelvic cul-de-sac.  It was necessary to proceed with endovaginal exam following the transabdominal exam to visualize the endometrium and ovaries.  Comparison:  12/30/2008  Findings: Uterus:  6.4 x 4.3 x 3.4 cm.  Anteverted, anteflexed.  Mild inhomogeneous myometrium but no focal abnormality.  This may be seen with adenomyosis.  Endometrium: 4 mm.  Uniformly echogenic without focal abnormality.  Right ovary: Not visualized.  No adnexal mass.  Left ovary: 2.1 x 2.0 x 1.9 cm.  Normal.  Other Findings:  No free fluid  IMPRESSION: No focal acute abnormality.  Mild inhomogeneity of the myometrium may be seen with adenomyosis which can be a cause of pelvic pain. This could be confirmed at pelvic MRI with contrast if clinically indicated.   Original Report Authenticated By:  Christiana Pellant, M.D.   US Pelvis Complete  12/02/2012   *RADIOLOGY REPORT*  Clinical Data: Right lower quadrant pelvic pain  TRANSABDOMINAL AND TRANSVAGINAL ULTRASOUND OF PELVIS  Technique:  Both transabdominal and transvaginal ultrasound examinations of the pelvis were performed.  Transabdominal technique was performed for global imaging of the pelvis including uterus, ovaries, adnexal regions, and pelvic cul-de-sac.  It was necessary to proceed with endovaginal exam following the transabdominal exam to visualize the endometrium and ovaries.  Comparison:  12/30/2008  Findings: Uterus:  6.4 x 4.3 x 3.4 cm.  Anteverted, anteflexed.  Mild inhomogeneous myometrium but no focal abnormality.  This may be seen with adenomyosis.  Endometrium: 4 mm.  Uniformly echogenic without focal abnormality.  Right ovary: Not visualized.  No adnexal mass.  Left ovary: 2.1 x 2.0 x 1.9 cm.  Normal.  Other Findings:  No free fluid  IMPRESSION: No focal acute abnormality.  Mild inhomogeneity of the myometrium may be seen with adenomyosis which can be a cause of pelvic pain. This could be confirmed at pelvic MRI with contrast if clinically indicated.   Original Report Authenticated By: Christiana Pellant, M.D.   1. Trichimoniasis     MDM  3:46 PM Will get [place] to r/o abscess as pt has adnexal tenderness 4:36 PM No abscess noted:pt treated for std here and treated for trich on outpt basis  Teressa Lower, NP 12/02/12 1636

## 2012-12-02 NOTE — ED Notes (Signed)
Provider at bedside

## 2012-12-02 NOTE — ED Notes (Signed)
Abdominal pain. Recent UTI. Vaginal discharge. Fever.

## 2012-12-03 LAB — GC/CHLAMYDIA PROBE AMP: CT Probe RNA: NEGATIVE

## 2012-12-03 NOTE — ED Provider Notes (Signed)
Medical screening examination/treatment/procedure(s) were performed by non-physician practitioner and as supervising physician I was immediately available for consultation/collaboration.  Derwood Kaplan, MD 12/03/12 [phone]

## 2012-12-04 LAB — URINE CULTURE: Colony Count: 7000

## 2013-02-26 DIAGNOSIS — E1165 Type 2 diabetes mellitus with hyperglycemia: Secondary | ICD-10-CM

## 2013-02-26 DIAGNOSIS — Z794 Long term (current) use of insulin: Secondary | ICD-10-CM | POA: Insufficient documentation

## 2013-02-26 DIAGNOSIS — E119 Type 2 diabetes mellitus without complications: Secondary | ICD-10-CM

## 2013-02-26 HISTORY — DX: Type 2 diabetes mellitus with hyperglycemia: E11.65

## 2013-02-26 HISTORY — DX: Type 2 diabetes mellitus without complications: E11.9

## 2013-03-30 DIAGNOSIS — J302 Other seasonal allergic rhinitis: Secondary | ICD-10-CM

## 2013-03-30 HISTORY — DX: Other seasonal allergic rhinitis: J30.2

## 2013-06-18 ENCOUNTER — Encounter (HOSPITAL_BASED_OUTPATIENT_CLINIC_OR_DEPARTMENT_OTHER): Payer: Self-pay | Admitting: Emergency Medicine

## 2013-06-18 ENCOUNTER — Emergency Department (HOSPITAL_BASED_OUTPATIENT_CLINIC_OR_DEPARTMENT_OTHER)
Admission: EM | Admit: 2013-06-18 | Discharge: 2013-06-18 | Disposition: A | Discharge status: Home / Self Care | Payer: Commercial Managed Care - PPO | Attending: Emergency Medicine | Admitting: Emergency Medicine

## 2013-06-18 DIAGNOSIS — E119 Type 2 diabetes mellitus without complications: Secondary | ICD-10-CM | POA: Insufficient documentation

## 2013-06-18 DIAGNOSIS — B9689 Other specified bacterial agents as the cause of diseases classified elsewhere: Secondary | ICD-10-CM | POA: Insufficient documentation

## 2013-06-18 DIAGNOSIS — A499 Bacterial infection, unspecified: Secondary | ICD-10-CM | POA: Insufficient documentation

## 2013-06-18 DIAGNOSIS — Z3202 Encounter for pregnancy test, result negative: Secondary | ICD-10-CM | POA: Insufficient documentation

## 2013-06-18 DIAGNOSIS — Z87891 Personal history of nicotine dependence: Secondary | ICD-10-CM | POA: Insufficient documentation

## 2013-06-18 DIAGNOSIS — N760 Acute vaginitis: Secondary | ICD-10-CM | POA: Insufficient documentation

## 2013-06-18 DIAGNOSIS — N390 Urinary tract infection, site not specified: Secondary | ICD-10-CM

## 2013-06-18 DIAGNOSIS — J45909 Unspecified asthma, uncomplicated: Secondary | ICD-10-CM | POA: Insufficient documentation

## 2013-06-18 DIAGNOSIS — Z79899 Other long term (current) drug therapy: Secondary | ICD-10-CM | POA: Insufficient documentation

## 2013-06-18 DIAGNOSIS — R634 Abnormal weight loss: Secondary | ICD-10-CM | POA: Insufficient documentation

## 2013-06-18 DIAGNOSIS — Z8781 Personal history of (healed) traumatic fracture: Secondary | ICD-10-CM | POA: Insufficient documentation

## 2013-06-18 LAB — URINALYSIS, ROUTINE W REFLEX MICROSCOPIC
Bilirubin Urine: NEGATIVE
Glucose, UA: NEGATIVE mg/dL
Ketones, ur: NEGATIVE mg/dL
NITRITE: NEGATIVE
Protein, ur: NEGATIVE mg/dL
SPECIFIC GRAVITY, URINE: 1.014 (ref 1.005–1.030)
UROBILINOGEN UA: 0.2 mg/dL (ref 0.0–1.0)
pH: 6 (ref 5.0–8.0)

## 2013-06-18 LAB — PREGNANCY, URINE: Preg Test, Ur: NEGATIVE

## 2013-06-18 LAB — URINE MICROSCOPIC-ADD ON

## 2013-06-18 LAB — WET PREP, GENITAL
TRICH WET PREP: NONE SEEN
Yeast Wet Prep HPF POC: NONE SEEN

## 2013-06-18 MED ORDER — CIPROFLOXACIN HCL 500 MG PO TABS: 500.0000 mg | ORAL_TABLET | Freq: Two times a day (BID) | ORAL | Status: DC

## 2013-06-18 MED ORDER — METRONIDAZOLE 500 MG PO TABS: 500.0000 mg | ORAL_TABLET | Freq: Three times a day (TID) | ORAL | Status: DC

## 2013-06-18 MED ORDER — CEFTRIAXONE SODIUM 250 MG IJ SOLR
250.0000 mg | Freq: Once | INTRAMUSCULAR | Status: AC
  Administered 2013-06-18: 250 mg via INTRAMUSCULAR
  Filled 2013-06-18: qty 250

## 2013-06-18 MED ORDER — AZITHROMYCIN 250 MG PO TABS
1000.0000 mg | ORAL_TABLET | Freq: Once | ORAL | Status: AC
  Administered 2013-06-18: 1000 mg via ORAL
  Filled 2013-06-18: qty 4

## 2013-06-18 MED ORDER — HYDROCODONE-ACETAMINOPHEN 5-325 MG PO TABS: 2.0000 | ORAL_TABLET | ORAL | Status: DC | PRN

## 2013-06-18 NOTE — ED Notes (Signed)
Pt states that she had sex on Sunday, pt states her pain started Monday, pt also states fever yesterday with discharge. Pt denies vaginal bleeding.

## 2013-06-18 NOTE — ED Provider Notes (Signed)
CSN: [id number]     Arrival date & time 06/18/13  1757 History   This chart was scribed for [name], MD by Ladona Ridgel Day, ED scribe. This patient was seen in room MH06/MH06 and the patient's care was started at 1757.  Chief Complaint  Patient presents with  . Abdominal Pain  . Vaginal Discharge  . Urinary Retention   The history is provided by the patient. No language interpreter was used.   HPI Comments: Karen Patrick is a 48 y.o. female who presents to the Emergency Department w/hx of UTIs complaining of constant mild to moderate lower abdominal pain, dysuria and fever, onset yesterday. She reports temporary relief w/tylenol, reports fever seemed worse yesterday than today, also tried cystex plus for for her sx w/minimal relief. She states this feels like previous episodes of UTIs. She reports associated dysuria and bilateral lower back pain. She states had unprotected sex 5 days ago. She denies constipation or diarrhea. She has been having normal BMs.  She reports that she is currently in a trial study for a diabetes medicine which is and end of barrier device inserted manually for type II diabetes. She reports w/this medicine, she is not supposed to take NSAIDS. She reports has lost weight on this medicine and decreased her A1C level.   Hx of Endometrial ablation. No hysterectomy.   Past Medical History  Diagnosis Date  . Diabetes mellitus   . Asthma   . Ankle fracture, right    Past Surgical History  Procedure Laterality Date  . Hammer toe surgery    . Lipoma excision    . Knee arthrocentesis    . Foot fasciotomy    . Endometrial ablation w/ novasure    . Back surgery    . Lipoma removed     Family History  Problem Relation Age of Onset  . Stroke Mother   . Diabetes Mother   . Hypertension Mother   . Stroke Father   . Sudden death Father   . Hypertension Father   . Hyperlipidemia Father   . Heart attack Father   . Diabetes Father   . Diabetes Maternal Grandmother    . Heart attack Maternal Grandmother   . Hypertension Maternal Grandmother   . Diabetes Paternal Grandmother   . Sudden death Paternal Grandmother    History  Substance Use Topics  . Smoking status: Former Smoker    Quit date: 04/29/2012  . Smokeless tobacco: Never Used  . Alcohol Use: No   OB History   Grav Para Term Preterm Abortions TAB SAB Ect Mult Living                 Review of Systems  Constitutional: Positive for fever. Negative for chills.  Respiratory: Negative for cough and shortness of breath.   Cardiovascular: Negative for chest pain.  Gastrointestinal: Positive for abdominal pain. Negative for nausea, vomiting and diarrhea.  Genitourinary: Positive for dysuria.  Musculoskeletal: Negative for back pain.  All other systems reviewed and are negative.    Allergies  Review of patient's allergies indicates no known allergies.  Home Medications   Current Outpatient Rx  Name  Route  Sig  Dispense  Refill  . glipiZIDE (GLUCOTROL) 5 MG tablet   Oral   Take 1 tablet (5 mg total) by mouth 2 (two) times daily before a meal.   60 tablet   1   . HYDROcodone-acetaminophen (NORCO/VICODIN) 5-325 MG per tablet   Oral   Take 2 tablets by  mouth every 4 (four) hours as needed for pain.   10 tablet   0   . HYDROcodone-acetaminophen (VICODIN) 5-500 MG per tablet   Oral   Take 1 tablet by mouth every 6 (six) hours as needed. For pain.         . metFORMIN (GLUCOPHAGE) 1000 MG tablet   Oral   Take 1,000 mg by mouth 2 (two) times daily with a meal.           . metFORMIN (GLUCOPHAGE) 1000 MG tablet   Oral   Take 1 tablet (1,000 mg total) by mouth 2 (two) times daily.   60 tablet   1   . Methenamine-Sodium Salicylate (CYSTEX) 162-162.5 MG TABS   Oral   Take 2 tablets by mouth 3 (three) times daily as needed. For urinary tract infection         . metroNIDAZOLE (FLAGYL) 500 MG tablet   Oral   Take 1 tablet (500 mg total) by mouth 2 (two) times daily.   14  tablet   0    Triage Vitals: BP 136/87  Pulse 88  Temp(Src) 98.7 F (37.1 C)  Resp 16  Ht 5\' 2"  (1.575 m)  Wt 192 lb (87.091 kg)  BMI 35.11 kg/m2  SpO2 98%  Physical Exam  Nursing note and vitals reviewed. Constitutional: She is oriented to person, place, and time. She appears well-developed and well-nourished. No distress.  HENT:  Head: Normocephalic and atraumatic.  Eyes: Conjunctivae are normal. Right eye exhibits no discharge. Left eye exhibits no discharge.  Neck: Normal range of motion.  Cardiovascular: Normal rate.   Pulmonary/Chest: Effort normal. No respiratory distress.  Abdominal: Soft. She exhibits no distension. There is tenderness. There is no rebound and no guarding.  Suprapubic tenderness to palpation  Musculoskeletal: Normal range of motion. She exhibits no edema.  Neurological: She is alert and oriented to person, place, and time.  Skin: Skin is warm and dry.  Psychiatric: She has a normal mood and affect. Thought content normal.   ED Course  Procedures (including critical care time) DIAGNOSTIC STUDIES: Oxygen Saturation is 98% on room air, normal by my interpretation.    COORDINATION OF CARE: At 711 PM Discussed treatment plan with patient which includes UA, wet prep, pelvic exam. Patient agrees.   Labs Review Labs Reviewed  URINALYSIS, ROUTINE W REFLEX MICROSCOPIC - Abnormal; Notable for the following:    APPearance CLOUDY (*)    Hgb urine dipstick TRACE (*)    Leukocytes, UA LARGE (*)    All other components within normal limits  URINE MICROSCOPIC-ADD ON - Abnormal; Notable for the following:    Squamous Epithelial / LPF FEW (*)    Bacteria, UA MANY (*)    All other components within normal limits  WET PREP, GENITAL  GC/CHLAMYDIA PROBE AMP  PREGNANCY, URINE   Imaging Review No results found.   MDM   Final diagnoses:  None    Patient is a 48 year old female who presents with complaints of dysuria and vaginal discharge. She admits to  unprotected sex several days ago with a new partner. Workup reveals a urinary tract infection which will be treated with Cipro. She also has many clue cells consistent with bacterial vaginosis. She will be given Flagyl for this. She was also found to have many white cells on the wet prep. She will also be given ceftriaxone and Zithromax here. Her cultures will be pending and she will be notified if she requires further treatment.  I personally performed the services described in this documentation, which was scribed in my presence. The recorded information has been reviewed and is accurate.       [name], MD 06/18/13 [phone]

## 2013-06-18 NOTE — Discharge Instructions (Signed)
Cipro and Flagyl as prescribed.  Hydrocodone as needed for pain.  We will call you if your cultures required further treatment.   Urinary Tract Infection Urinary tract infections (UTIs) can develop anywhere along your urinary tract. Your urinary tract is your body's drainage system for removing wastes and extra water. Your urinary tract includes two kidneys, two ureters, a bladder, and a urethra. Your kidneys are a pair of bean-shaped organs. Each kidney is about the size of your fist. They are located below your ribs, one on each side of your spine. CAUSES Infections are caused by microbes, which are microscopic organisms, including fungi, viruses, and bacteria. These organisms are so small that they can only be seen through a microscope. Bacteria are the microbes that most commonly cause UTIs. SYMPTOMS  Symptoms of UTIs may vary by age and gender of the patient and by the location of the infection. Symptoms in young women typically include a frequent and intense urge to urinate and a painful, burning feeling in the bladder or urethra during urination. Older women and men are more likely to be tired, shaky, and weak and have muscle aches and abdominal pain. A fever may mean the infection is in your kidneys. Other symptoms of a kidney infection include pain in your back or sides below the ribs, nausea, and vomiting. DIAGNOSIS To diagnose a UTI, your caregiver will ask you about your symptoms. Your caregiver also will ask to provide a urine sample. The urine sample will be tested for bacteria and white blood cells. White blood cells are made by your body to help fight infection. TREATMENT  Typically, UTIs can be treated with medication. Because most UTIs are caused by a bacterial infection, they usually can be treated with the use of antibiotics. The choice of antibiotic and length of treatment depend on your symptoms and the type of bacteria causing your infection. HOME CARE INSTRUCTIONS  If you  were prescribed antibiotics, take them exactly as your caregiver instructs you. Finish the medication even if you feel better after you have only taken some of the medication.  Drink enough water and fluids to keep your urine clear or pale yellow.  Avoid caffeine, tea, and carbonated beverages. They tend to irritate your bladder.  Empty your bladder often. Avoid holding urine for long periods of time.  Empty your bladder before and after sexual intercourse.  After a bowel movement, women should cleanse from front to back. Use each tissue only once. SEEK MEDICAL CARE IF:   You have back pain.  You develop a fever.  Your symptoms do not begin to resolve within 3 days. SEEK IMMEDIATE MEDICAL CARE IF:   You have severe back pain or lower abdominal pain.  You develop chills.  You have nausea or vomiting.  You have continued burning or discomfort with urination. MAKE SURE YOU:   Understand these instructions.  Will watch your condition.  Will get help right away if you are not doing well or get worse. Document Released: 01/24/2005 Document Revised: 10/16/2011 Document Reviewed: 05/25/2011 [facility] Patient Information 2014 [place], [place].  Bacterial Vaginosis Bacterial vaginosis is a vaginal infection that occurs when the normal balance of bacteria in the vagina is disrupted. It results from an overgrowth of certain bacteria. This is the most common vaginal infection in women of childbearing age. Treatment is important to prevent complications, especially in pregnant women, as it can cause a premature delivery. CAUSES  Bacterial vaginosis is caused by an increase in harmful bacteria that  are normally present in smaller amounts in the vagina. Several different kinds of bacteria can cause bacterial vaginosis. However, the reason that the condition develops is not fully understood. RISK FACTORS Certain activities or behaviors can put you at an increased risk of developing bacterial  vaginosis, including:  Having a new sex partner or multiple sex partners.  Douching.  Using an intrauterine device (IUD) for contraception. Women do not get bacterial vaginosis from toilet seats, bedding, swimming pools, or contact with objects around them. SIGNS AND SYMPTOMS  Some women with bacterial vaginosis have no signs or symptoms. Common symptoms include:  Grey vaginal discharge.  A fishlike odor with discharge, especially after sexual intercourse.  Itching or burning of the vagina and vulva.  Burning or pain with urination. DIAGNOSIS  Your health care provider will take a medical history and examine the vagina for signs of bacterial vaginosis. A sample of vaginal fluid may be taken. Your health care provider will look at this sample under a microscope to check for bacteria and abnormal cells. A vaginal pH test may also be done.  TREATMENT  Bacterial vaginosis may be treated with antibiotic medicines. These may be given in the form of a pill or a vaginal cream. A second round of antibiotics may be prescribed if the condition comes back after treatment.  HOME CARE INSTRUCTIONS   Only take over-the-counter or prescription medicines as directed by your health care provider.  If antibiotic medicine was prescribed, take it as directed. Make sure you finish it even if you start to feel better.  Do not have sex until treatment is completed.  Tell all sexual partners that you have a vaginal infection. They should see their health care provider and be treated if they have problems, such as a mild rash or itching.  Practice safe sex by using condoms and only having one sex partner. SEEK MEDICAL CARE IF:   Your symptoms are not improving after 3 days of treatment.  You have increased discharge or pain.  You have a fever. MAKE SURE YOU:   Understand these instructions.  Will watch your condition.  Will get help right away if you are not doing well or get worse. FOR MORE  INFORMATION  Centers for Disease Control and Prevention, Division of STD Prevention: [employer]s.co.zawww.cdc.gov/std American Sexual Health Association (ASHA): www.ashastd.org  Document Released: 04/16/2005 Document Revised: 02/04/2013 Document Reviewed: 11/26/2012 [facility] Patient Information 2014 [place]

## 2013-06-18 NOTE — ED Notes (Signed)
Pt c/o lower abd pain with urinary difficulty/burning with hx of UTI's, pt also c/o vaginal discharge after having un protective intercourse

## 2013-06-19 LAB — GC/CHLAMYDIA PROBE AMP
CT PROBE, AMP APTIMA: NEGATIVE
GC PROBE AMP APTIMA: NEGATIVE

## 2013-07-25 ENCOUNTER — Emergency Department (HOSPITAL_BASED_OUTPATIENT_CLINIC_OR_DEPARTMENT_OTHER): Payer: Commercial Managed Care - PPO

## 2013-07-25 ENCOUNTER — Encounter (HOSPITAL_BASED_OUTPATIENT_CLINIC_OR_DEPARTMENT_OTHER): Payer: Self-pay | Admitting: Emergency Medicine

## 2013-07-25 ENCOUNTER — Emergency Department (HOSPITAL_BASED_OUTPATIENT_CLINIC_OR_DEPARTMENT_OTHER)
Admission: EM | Admit: 2013-07-25 | Discharge: 2013-07-25 | Disposition: A | Discharge status: Home / Self Care | Payer: Commercial Managed Care - PPO | Attending: Emergency Medicine | Admitting: Emergency Medicine

## 2013-07-25 DIAGNOSIS — M5380 Other specified dorsopathies, site unspecified: Secondary | ICD-10-CM | POA: Insufficient documentation

## 2013-07-25 DIAGNOSIS — M545 Low back pain, unspecified: Secondary | ICD-10-CM | POA: Insufficient documentation

## 2013-07-25 DIAGNOSIS — Z79899 Other long term (current) drug therapy: Secondary | ICD-10-CM | POA: Insufficient documentation

## 2013-07-25 DIAGNOSIS — E119 Type 2 diabetes mellitus without complications: Secondary | ICD-10-CM | POA: Insufficient documentation

## 2013-07-25 DIAGNOSIS — Z87891 Personal history of nicotine dependence: Secondary | ICD-10-CM | POA: Insufficient documentation

## 2013-07-25 DIAGNOSIS — J45909 Unspecified asthma, uncomplicated: Secondary | ICD-10-CM | POA: Insufficient documentation

## 2013-07-25 DIAGNOSIS — M549 Dorsalgia, unspecified: Secondary | ICD-10-CM

## 2013-07-25 DIAGNOSIS — E669 Obesity, unspecified: Secondary | ICD-10-CM | POA: Insufficient documentation

## 2013-07-25 DIAGNOSIS — R1013 Epigastric pain: Secondary | ICD-10-CM | POA: Insufficient documentation

## 2013-07-25 DIAGNOSIS — Z8781 Personal history of (healed) traumatic fracture: Secondary | ICD-10-CM | POA: Insufficient documentation

## 2013-07-25 DIAGNOSIS — Z792 Long term (current) use of antibiotics: Secondary | ICD-10-CM | POA: Insufficient documentation

## 2013-07-25 DIAGNOSIS — R1011 Right upper quadrant pain: Secondary | ICD-10-CM | POA: Insufficient documentation

## 2013-07-25 LAB — CBC WITH DIFFERENTIAL/PLATELET
Basophils Absolute: 0 10*3/uL (ref 0.0–0.1)
Basophils Relative: 0 % (ref 0–1)
EOS ABS: 0.2 10*3/uL (ref 0.0–0.7)
Eosinophils Relative: 2 % (ref 0–5)
HCT: 37.5 % (ref 36.0–46.0)
Hemoglobin: 12.1 g/dL (ref 12.0–15.0)
LYMPHS ABS: 3.7 10*3/uL (ref 0.7–4.0)
Lymphocytes Relative: 40 % (ref 12–46)
MCH: 28.8 pg (ref 26.0–34.0)
MCHC: 32.3 g/dL (ref 30.0–36.0)
MCV: 89.3 fL (ref 78.0–100.0)
Monocytes Absolute: 0.5 10*3/uL (ref 0.1–1.0)
Monocytes Relative: 5 % (ref 3–12)
NEUTROS PCT: 52 % (ref 43–77)
Neutro Abs: 4.8 10*3/uL (ref 1.7–7.7)
Platelets: 251 10*3/uL (ref 150–400)
RBC: 4.2 MIL/uL (ref 3.87–5.11)
RDW: 13.8 % (ref 11.5–15.5)
WBC: 9.2 10*3/uL (ref 4.0–10.5)

## 2013-07-25 LAB — COMPREHENSIVE METABOLIC PANEL
ALK PHOS: 88 U/L (ref 39–117)
ALT: 16 U/L (ref 0–35)
AST: 15 U/L (ref 0–37)
Albumin: 3.6 g/dL (ref 3.5–5.2)
BUN: 13 mg/dL (ref 6–23)
CHLORIDE: 104 meq/L (ref 96–112)
CO2: 27 mEq/L (ref 19–32)
Calcium: 9.6 mg/dL (ref 8.4–10.5)
Creatinine, Ser: 0.6 mg/dL (ref 0.50–1.10)
GFR calc non Af Amer: >90 mL/min (ref >90)
GLUCOSE: 84 mg/dL (ref 70–99)
POTASSIUM: 4 meq/L (ref 3.7–5.3)
SODIUM: 142 meq/L (ref 137–147)
TOTAL PROTEIN: 7.4 g/dL (ref 6.0–8.3)

## 2013-07-25 LAB — URINALYSIS, ROUTINE W REFLEX MICROSCOPIC
BILIRUBIN URINE: NEGATIVE
Glucose, UA: NEGATIVE mg/dL
HGB URINE DIPSTICK: NEGATIVE
Ketones, ur: NEGATIVE mg/dL
Leukocytes, UA: NEGATIVE
Nitrite: NEGATIVE
PH: 6.5 (ref 5.0–8.0)
Protein, ur: NEGATIVE mg/dL
SPECIFIC GRAVITY, URINE: 1.009 (ref 1.005–1.030)
UROBILINOGEN UA: 0.2 mg/dL (ref 0.0–1.0)

## 2013-07-25 LAB — LIPASE, BLOOD: LIPASE: 29 U/L (ref 11–59)

## 2013-07-25 MED ORDER — CYCLOBENZAPRINE HCL 10 MG PO TABS: 10.0000 mg | ORAL_TABLET | Freq: Two times a day (BID) | ORAL | Status: DC | PRN

## 2013-07-25 MED ORDER — CYCLOBENZAPRINE HCL 10 MG PO TABS
10.0000 mg | ORAL_TABLET | Freq: Once | ORAL | Status: AC
  Administered 2013-07-25: 10 mg via ORAL
  Filled 2013-07-25: qty 1

## 2013-07-25 MED ORDER — HYDROCODONE-ACETAMINOPHEN 5-325 MG PO TABS: 1.0000 | ORAL_TABLET | ORAL | Status: DC | PRN

## 2013-07-25 NOTE — ED Notes (Signed)
NP at bedside.

## 2013-07-25 NOTE — ED Notes (Signed)
Patient currently enrolled EndoBarrier trial (for diabetics).

## 2013-07-25 NOTE — ED Notes (Signed)
Sonographer is aware that patient is enrolled in a study and no results of any findings related to the study should be given to the patient so as not to alter or void the study.

## 2013-07-25 NOTE — Discharge Instructions (Signed)
Take the medication as directed and follow up with your doctor. Stay on a low fat diet.

## 2013-07-25 NOTE — ED Notes (Signed)
Reports persistent low back pain x one week.  Pain worse with movement.  Recently treated for a UTI.

## 2013-07-25 NOTE — ED Provider Notes (Signed)
Medical screening examination/treatment/procedure(s) were performed by non-physician practitioner and as supervising physician I was immediately available for consultation/collaboration.     Bonne Whack, MD 07/25/13 [id number] 

## 2013-07-25 NOTE — ED Provider Notes (Signed)
CSN: [id number]     Arrival date & time 07/25/13  1524 History   First MD Initiated Contact with Patient 07/25/13 1546     Chief Complaint  Patient presents with  . Back Pain     (Consider location/radiation/quality/duration/timing/severity/associated sxs/prior Treatment) Patient is a 48 y.o. female presenting with back pain. The history is provided by the patient.  Back Pain Pain location: left lower side  Quality:  Aching Radiates to:  Does not radiate Pain severity:  Moderate Pain is:  Same all the time Onset quality:  Gradual Duration:  1 week Timing:  Constant Progression:  Worsening Chronicity:  New Context: not recent injury   Relieved by:  Nothing Worsened by:  Deep breathing, movement and twisting Associated symptoms: abdominal pain   Associated symptoms: no bladder incontinence, no bowel incontinence, no dysuria, no fever, no leg pain and no weakness   Risk factors: obesity    Karen Patrick is a 48 y.o. female who presents to the ED with back pain that started about a week ago and upper abdominal pain that started about the same time. She ate chicken about 2 hours prior to arrival to the ED. She was recently treated for a UTI. PMH significant for diabetes and asthma.   Past Medical History  Diagnosis Date  . Diabetes mellitus   . Asthma   . Ankle fracture, right    Past Surgical History  Procedure Laterality Date  . Hammer toe surgery    . Lipoma excision    . Knee arthrocentesis    . Foot fasciotomy    . Endometrial ablation w/ novasure    . Back surgery    . Lipoma removed     Family History  Problem Relation Age of Onset  . Stroke Mother   . Diabetes Mother   . Hypertension Mother   . Stroke Father   . Sudden death Father   . Hypertension Father   . Hyperlipidemia Father   . Heart attack Father   . Diabetes Father   . Diabetes Maternal Grandmother   . Heart attack Maternal Grandmother   . Hypertension Maternal Grandmother   . Diabetes Paternal  Grandmother   . Sudden death Paternal Grandmother    History  Substance Use Topics  . Smoking status: Former Smoker    Quit date: 04/29/2012  . Smokeless tobacco: Never Used  . Alcohol Use: No   OB History   Grav Para Term Preterm Abortions TAB SAB Ect Mult Living                 Review of Systems  Constitutional: Negative for fever and chills.  HENT: Negative.   Eyes: Negative for visual disturbance.  Respiratory: Negative for cough and shortness of breath.   Gastrointestinal: Positive for abdominal pain. Negative for nausea, vomiting and bowel incontinence.  Genitourinary: Negative for bladder incontinence, dysuria, urgency and frequency.  Musculoskeletal: Positive for back pain.  Skin: Negative for rash.  Neurological: Negative for dizziness, syncope and weakness.  Psychiatric/Behavioral: Negative for confusion. The patient is not nervous/anxious.       Allergies  Review of patient's allergies indicates no known allergies.  Home Medications   Current Outpatient Rx  Name  Route  Sig  Dispense  Refill  . omeprazole (PRILOSEC) 40 MG capsule   Oral   Take 40 mg by mouth 2 (two) times daily.         . ciprofloxacin (CIPRO) 500 MG tablet   Oral  Take 1 tablet (500 mg total) by mouth 2 (two) times daily. One po bid x 7 days   14 tablet   0   . cyclobenzaprine (FLEXERIL) 10 MG tablet   Oral   Take 1 tablet (10 mg total) by mouth 2 (two) times daily as needed for muscle spasms.   20 tablet   0   . glipiZIDE (GLUCOTROL) 5 MG tablet   Oral   Take 1 tablet (5 mg total) by mouth 2 (two) times daily before a meal.   60 tablet   1   . HYDROcodone-acetaminophen (NORCO/VICODIN) 5-325 MG per tablet   Oral   Take 1 tablet by mouth every 4 (four) hours as needed.   15 tablet   0   . HYDROcodone-acetaminophen (VICODIN) 5-500 MG per tablet   Oral   Take 1 tablet by mouth every 6 (six) hours as needed. For pain.         . metFORMIN (GLUCOPHAGE) 1000 MG tablet    Oral   Take 1,000 mg by mouth 2 (two) times daily with a meal.           . metFORMIN (GLUCOPHAGE) 1000 MG tablet   Oral   Take 1 tablet (1,000 mg total) by mouth 2 (two) times daily.   60 tablet   1   . Methenamine-Sodium Salicylate (CYSTEX) 162-162.5 MG TABS   Oral   Take 2 tablets by mouth 3 (three) times daily as needed. For urinary tract infection         . metroNIDAZOLE (FLAGYL) 500 MG tablet   Oral   Take 1 tablet (500 mg total) by mouth 2 (two) times daily.   14 tablet   0   . metroNIDAZOLE (FLAGYL) 500 MG tablet   Oral   Take 1 tablet (500 mg total) by mouth 3 (three) times daily.   21 tablet   0    BP 150/87  Pulse 80  Temp(Src) 98.3 F (36.8 C) (Oral)  Resp 18  Ht 5\' 2"  (1.575 m)  Wt 188 lb (85.276 kg)  BMI 34.38 kg/m2  SpO2 99% Physical Exam  Nursing note and vitals reviewed. Constitutional: She is oriented to person, place, and time. She appears well-developed and well-nourished.  HENT:  Head: Normocephalic and atraumatic.  Eyes: Conjunctivae and EOM are normal. Pupils are equal, round, and reactive to light.  Neck: Normal range of motion. Neck supple.  Cardiovascular: Normal rate and regular rhythm.   Pulmonary/Chest: Effort normal. She has no wheezes. She has no rales.  Abdominal: Soft. Normal appearance and bowel sounds are normal. There is tenderness in the right upper quadrant and epigastric area. There is no rigidity, no rebound, no guarding and no CVA tenderness.  Musculoskeletal: Normal range of motion.       Lumbar back: She exhibits tenderness and spasm. She exhibits normal range of motion and normal pulse.       Back:  Neurological: She is alert and oriented to person, place, and time. She has normal strength. No cranial nerve deficit or sensory deficit. Gait normal.  Reflex Scores:      Bicep reflexes are 2+ on the right side and 2+ on the left side.      Brachioradialis reflexes are 2+ on the right side and 2+ on the left side.       Patellar reflexes are 2+ on the right side and 2+ on the left side.      Achilles reflexes are 2+ on  the right side and 2+ on the left side. Ambulatory without foot drag.  Skin: Skin is warm and dry.  Psychiatric: She has a normal mood and affect. Her behavior is normal.    ED Course  Procedures Results for orders placed during the hospital encounter of 07/25/13 (from the past 24 hour(s))  CBC WITH DIFFERENTIAL     Status: None   Collection Time    07/25/13  4:26 PM      Result Value Ref Range   WBC 9.2  4.0 - 10.5 K/uL   RBC 4.20  3.87 - 5.11 MIL/uL   Hemoglobin 12.1  12.0 - 15.0 g/dL   HCT 16.137.5  09.636.0 - 04.546.0 %   MCV 89.3  78.0 - 100.0 fL   MCH 28.8  26.0 - 34.0 pg   MCHC 32.3  30.0 - 36.0 g/dL   RDW 40.913.8  81.111.5 - 91.415.5 %   Platelets 251  150 - 400 K/uL   Neutrophils Relative % 52  43 - 77 %   Neutro Abs 4.8  1.7 - 7.7 K/uL   Lymphocytes Relative 40  12 - 46 %   Lymphs Abs 3.7  0.7 - 4.0 K/uL   Monocytes Relative 5  3 - 12 %   Monocytes Absolute 0.5  0.1 - 1.0 K/uL   Eosinophils Relative 2  0 - 5 %   Eosinophils Absolute 0.2  0.0 - 0.7 K/uL   Basophils Relative 0  0 - 1 %   Basophils Absolute 0.0  0.0 - 0.1 K/uL  COMPREHENSIVE METABOLIC PANEL     Status: Abnormal   Collection Time    07/25/13  4:26 PM      Result Value Ref Range   Sodium 142  137 - 147 mEq/L   Potassium 4.0  3.7 - 5.3 mEq/L   Chloride 104  96 - 112 mEq/L   CO2 27  19 - 32 mEq/L   Glucose, Bld 84  70 - 99 mg/dL   BUN 13  6 - 23 mg/dL   Creatinine, Ser 7.820.60  0.50 - 1.10 mg/dL   Calcium 9.6  8.4 - 95.610.5 mg/dL   Total Protein 7.4  6.0 - 8.3 g/dL   Albumin 3.6  3.5 - 5.2 g/dL   AST 15  0 - 37 U/L   ALT 16  0 - 35 U/L   Alkaline Phosphatase 88  39 - 117 U/L   Total Bilirubin <0.2 (*) 0.3 - 1.2 mg/dL   GFR calc non Af Amer >90  >90 mL/min   GFR calc Af Amer >90  >90 mL/min  LIPASE, BLOOD     Status: None   Collection Time    07/25/13  4:26 PM      Result Value Ref Range   Lipase 29  11 - 59 U/L    URINALYSIS, ROUTINE W REFLEX MICROSCOPIC     Status: None   Collection Time    07/25/13  6:40 PM      Result Value Ref Range   Color, Urine YELLOW  YELLOW   APPearance CLEAR  CLEAR   Specific Gravity, Urine 1.009  1.005 - 1.030   pH 6.5  5.0 - 8.0   Glucose, UA NEGATIVE  NEGATIVE mg/dL   Hgb urine dipstick NEGATIVE  NEGATIVE   Bilirubin Urine NEGATIVE  NEGATIVE   Ketones, ur NEGATIVE  NEGATIVE mg/dL   Protein, ur NEGATIVE  NEGATIVE mg/dL   Urobilinogen, UA 0.2  0.0 - 1.0  mg/dL   Nitrite NEGATIVE  NEGATIVE   Leukocytes, UA NEGATIVE  NEGATIVE   US Abdomen Complete  07/25/2013   CLINICAL DATA:  Right upper quadrant and epigastric abdominal pain for the past 2 weeks. Mid back pain.  EXAM: ULTRASOUND ABDOMEN COMPLETE  COMPARISON:  Abdomen and pelvis CT dated 08/15/2008.  FINDINGS: Gallbladder:  Poorly distended. The patient ate chicken wings 2 hr prior to the examination. No visible gallstones, abnormal wall thickening or pericholecystic fluid.  Common bile duct:  Diameter: 4.3 mm  Liver:  No focal lesion identified. Within normal limits in parenchymal echogenicity.  IVC:  No abnormality visualized.  Pancreas:  Visualized portion unremarkable.  Spleen:  Size and appearance within normal limits.  Right Kidney:  Length: 11.1 cm. Echogenicity within normal limits. No mass or hydronephrosis visualized.  Left Kidney:  Length: 10.5 cm. Echogenicity within normal limits. No mass or hydronephrosis visualized.  Abdominal aorta:  No aneurysm visualized.  Other findings:  None.  IMPRESSION: Poorly distended gallbladder due to a nonfasting examination. No abnormality seen.   Electronically Signed   By: Gordan Payment M.D.   On: 07/25/2013 18:09    MDM  48 y.o. female with muscle spasm of left side of back. She also has epigastric pain and RUQ pain after eating chicken 2 hours prior to arrival to the ED. I have reviewed this patient's vital signs, nurses notes, appropriate labs and imaging.  I have discussed  findings with the patient and plan of care. She voices understanding.    Medication List    TAKE these medications       cyclobenzaprine 10 MG tablet  Commonly known as:  FLEXERIL  Take 1 tablet (10 mg total) by mouth 2 (two) times daily as needed for muscle spasms.      ASK your doctor about these medications       ciprofloxacin 500 MG tablet  Commonly known as:  CIPRO  Take 1 tablet (500 mg total) by mouth 2 (two) times daily. One po bid x 7 days     CYSTEX 162-162.5 MG Tabs  Generic drug:  Methenamine-Sodium Salicylate  Take 2 tablets by mouth 3 (three) times daily as needed. For urinary tract infection     glipiZIDE 5 MG tablet  Commonly known as:  GLUCOTROL  Take 1 tablet (5 mg total) by mouth 2 (two) times daily before a meal.     HYDROcodone-acetaminophen 5-500 MG per tablet  Commonly known as:  VICODIN  Take 1 tablet by mouth every 6 (six) hours as needed. For pain.  Ask about: Which instructions should I use?     HYDROcodone-acetaminophen 5-325 MG per tablet  Commonly known as:  NORCO/VICODIN  Take 1 tablet by mouth every 4 (four) hours as needed.  Ask about: Which instructions should I use?     metFORMIN 1000 MG tablet  Commonly known as:  GLUCOPHAGE  Take 1,000 mg by mouth 2 (two) times daily with a meal.     metFORMIN 1000 MG tablet  Commonly known as:  GLUCOPHAGE  Take 1 tablet (1,000 mg total) by mouth 2 (two) times daily.     metroNIDAZOLE 500 MG tablet  Commonly known as:  FLAGYL  Take 1 tablet (500 mg total) by mouth 2 (two) times daily.     metroNIDAZOLE 500 MG tablet  Commonly known as:  FLAGYL  Take 1 tablet (500 mg total) by mouth 3 (three) times daily.     omeprazole 40 MG capsule  Commonly known as:  PRILOSEC  Take 40 mg by mouth 2 (two) times daily.           Janne Napoleon, [place] 07/25/13 2245

## 2014-01-06 DIAGNOSIS — K859 Acute pancreatitis without necrosis or infection, unspecified: Secondary | ICD-10-CM

## 2014-01-06 HISTORY — DX: Acute pancreatitis without necrosis or infection, unspecified: K85.90

## 2014-01-07 DIAGNOSIS — A499 Bacterial infection, unspecified: Secondary | ICD-10-CM

## 2014-01-07 DIAGNOSIS — N390 Urinary tract infection, site not specified: Secondary | ICD-10-CM | POA: Insufficient documentation

## 2014-01-07 HISTORY — DX: Bacterial infection, unspecified: A49.9

## 2014-01-07 HISTORY — DX: Urinary tract infection, site not specified: N39.0

## 2014-03-13 ENCOUNTER — Emergency Department (HOSPITAL_BASED_OUTPATIENT_CLINIC_OR_DEPARTMENT_OTHER)
Admission: EM | Admit: 2014-03-13 | Discharge: 2014-03-13 | Disposition: A | Discharge status: Home / Self Care | Payer: BC Managed Care – PPO | Attending: Emergency Medicine | Admitting: Emergency Medicine

## 2014-03-13 ENCOUNTER — Encounter (HOSPITAL_BASED_OUTPATIENT_CLINIC_OR_DEPARTMENT_OTHER): Payer: Self-pay | Admitting: *Deleted

## 2014-03-13 DIAGNOSIS — J45909 Unspecified asthma, uncomplicated: Secondary | ICD-10-CM | POA: Insufficient documentation

## 2014-03-13 DIAGNOSIS — R1030 Lower abdominal pain, unspecified: Secondary | ICD-10-CM | POA: Diagnosis present

## 2014-03-13 DIAGNOSIS — R197 Diarrhea, unspecified: Secondary | ICD-10-CM | POA: Insufficient documentation

## 2014-03-13 DIAGNOSIS — Z79899 Other long term (current) drug therapy: Secondary | ICD-10-CM | POA: Diagnosis not present

## 2014-03-13 DIAGNOSIS — Z87891 Personal history of nicotine dependence: Secondary | ICD-10-CM | POA: Insufficient documentation

## 2014-03-13 DIAGNOSIS — E119 Type 2 diabetes mellitus without complications: Secondary | ICD-10-CM | POA: Diagnosis not present

## 2014-03-13 DIAGNOSIS — R112 Nausea with vomiting, unspecified: Secondary | ICD-10-CM | POA: Insufficient documentation

## 2014-03-13 DIAGNOSIS — N760 Acute vaginitis: Secondary | ICD-10-CM | POA: Diagnosis not present

## 2014-03-13 DIAGNOSIS — Z3202 Encounter for pregnancy test, result negative: Secondary | ICD-10-CM | POA: Diagnosis not present

## 2014-03-13 DIAGNOSIS — Z8781 Personal history of (healed) traumatic fracture: Secondary | ICD-10-CM | POA: Diagnosis not present

## 2014-03-13 LAB — URINALYSIS, ROUTINE W REFLEX MICROSCOPIC
Bilirubin Urine: NEGATIVE
GLUCOSE, UA: NEGATIVE mg/dL
Hgb urine dipstick: NEGATIVE
Ketones, ur: NEGATIVE mg/dL
Leukocytes, UA: NEGATIVE
Nitrite: NEGATIVE
Protein, ur: NEGATIVE mg/dL
SPECIFIC GRAVITY, URINE: 1.014 (ref 1.005–1.030)
Urobilinogen, UA: 0.2 mg/dL (ref 0.0–1.0)
pH: 5.5 (ref 5.0–8.0)

## 2014-03-13 LAB — COMPREHENSIVE METABOLIC PANEL
ALT: 16 U/L (ref 0–35)
ANION GAP: 14 (ref 5–15)
AST: 15 U/L (ref 0–37)
Albumin: 3.7 g/dL (ref 3.5–5.2)
Alkaline Phosphatase: 106 U/L (ref 39–117)
BUN: 10 mg/dL (ref 6–23)
CO2: 22 meq/L (ref 19–32)
CREATININE: 0.5 mg/dL (ref 0.50–1.10)
Calcium: 9.3 mg/dL (ref 8.4–10.5)
Chloride: 105 mEq/L (ref 96–112)
Glucose, Bld: 90 mg/dL (ref 70–99)
Potassium: 4 mEq/L (ref 3.7–5.3)
Sodium: 141 mEq/L (ref 137–147)
TOTAL PROTEIN: 7.6 g/dL (ref 6.0–8.3)
Total Bilirubin: <0.2 mg/dL — ABNORMAL LOW (ref 0.3–1.2)

## 2014-03-13 LAB — CBC WITH DIFFERENTIAL/PLATELET
Basophils Absolute: 0 10*3/uL (ref 0.0–0.1)
Basophils Relative: 0 % (ref 0–1)
EOS ABS: 0.2 10*3/uL (ref 0.0–0.7)
Eosinophils Relative: 2 % (ref 0–5)
HCT: 37.4 % (ref 36.0–46.0)
Hemoglobin: 12.3 g/dL (ref 12.0–15.0)
LYMPHS ABS: 3.3 10*3/uL (ref 0.7–4.0)
Lymphocytes Relative: 45 % (ref 12–46)
MCH: 28.3 pg (ref 26.0–34.0)
MCHC: 32.9 g/dL (ref 30.0–36.0)
MCV: 86.2 fL (ref 78.0–100.0)
Monocytes Absolute: 0.5 10*3/uL (ref 0.1–1.0)
Monocytes Relative: 6 % (ref 3–12)
NEUTROS ABS: 3.3 10*3/uL (ref 1.7–7.7)
NEUTROS PCT: 47 % (ref 43–77)
Platelets: 229 10*3/uL (ref 150–400)
RBC: 4.34 MIL/uL (ref 3.87–5.11)
RDW: 13.6 % (ref 11.5–15.5)
WBC: 7.3 10*3/uL (ref 4.0–10.5)

## 2014-03-13 LAB — PREGNANCY, URINE: PREG TEST UR: NEGATIVE

## 2014-03-13 LAB — HIV ANTIBODY (ROUTINE TESTING W REFLEX): HIV 1&2 Ab, 4th Generation: NONREACTIVE

## 2014-03-13 LAB — WET PREP, GENITAL
TRICH WET PREP: NONE SEEN
Yeast Wet Prep HPF POC: NONE SEEN

## 2014-03-13 LAB — RPR

## 2014-03-13 LAB — LIPASE, BLOOD: LIPASE: 47 U/L (ref 11–59)

## 2014-03-13 MED ORDER — METRONIDAZOLE 500 MG PO TABS: 500.0000 mg | ORAL_TABLET | Freq: Two times a day (BID) | ORAL | Status: DC

## 2014-03-13 MED ORDER — CEFTRIAXONE SODIUM 250 MG IJ SOLR
250.0000 mg | Freq: Once | INTRAMUSCULAR | Status: DC
  Filled 2014-03-13: qty 250

## 2014-03-13 MED ORDER — AZITHROMYCIN 250 MG PO TABS
1000.0000 mg | ORAL_TABLET | Freq: Once | ORAL | Status: DC
  Filled 2014-03-13: qty 4

## 2014-03-13 NOTE — Discharge Instructions (Signed)

## 2014-03-13 NOTE — ED Provider Notes (Addendum)
CSN: [id number]     Arrival date & time 03/13/14  1453 History  This chart was scribed for Rolan Bucco, MD by Roxy Cedar, ED Scribe. This patient was seen in room MH04/MH04 and the patient's care was started at 3:40 PM.   Chief Complaint  Patient presents with  . Urinary Tract Infection   Patient is a 48 y.o. female presenting with urinary tract infection. The history is provided by the patient. No language interpreter was used.  Urinary Tract Infection Associated symptoms include abdominal pain. Pertinent negatives include no chest pain, no headaches and no shortness of breath.   HPI Comments: Karen Patrick is a 48 y.o. female who presents to the Emergency Department complaining of moderate, gradually worsening lower abdominal pain and dysuria that began 3 weeks ago. She reports associated nausea, vomiting, and diarrhea for the past 2 weeks. She states that she started taking a new medication, Victoza, for the past month and is concerned that the nausea, vomiting and diarrhea may be side effects from the medication. She denies fever, or radiating pain to back. She reports associated mild vaginal discharge  Past Medical History  Diagnosis Date  . Diabetes mellitus   . Asthma   . Ankle fracture, right    Past Surgical History  Procedure Laterality Date  . Hammer toe surgery    . Lipoma excision    . Knee arthrocentesis    . Foot fasciotomy    . Endometrial ablation w/ novasure    . Back surgery    . Lipoma removed     Family History  Problem Relation Age of Onset  . Stroke Mother   . Diabetes Mother   . Hypertension Mother   . Stroke Father   . Sudden death Father   . Hypertension Father   . Hyperlipidemia Father   . Heart attack Father   . Diabetes Father   . Diabetes Maternal Grandmother   . Heart attack Maternal Grandmother   . Hypertension Maternal Grandmother   . Diabetes Paternal Grandmother   . Sudden death Paternal Grandmother    History  Substance Use  Topics  . Smoking status: Former Smoker    Quit date: 04/29/2012  . Smokeless tobacco: Never Used  . Alcohol Use: No   OB History    No data available     Review of Systems  Constitutional: Negative for fever, chills, diaphoresis and fatigue.  HENT: Negative for congestion, rhinorrhea and sneezing.   Eyes: Negative.   Respiratory: Negative for cough, chest tightness and shortness of breath.   Cardiovascular: Negative for chest pain and leg swelling.  Gastrointestinal: Positive for nausea, vomiting, abdominal pain and diarrhea. Negative for blood in stool.  Genitourinary: Positive for dysuria and vaginal discharge. Negative for frequency, hematuria, flank pain and difficulty urinating.  Musculoskeletal: Negative for back pain and arthralgias.  Skin: Negative for rash.  Neurological: Negative for dizziness, speech difficulty, weakness, numbness and headaches.   Allergies  Review of patient's allergies indicates no known allergies.  Home Medications   Prior to Admission medications   Medication Sig Start Date End Date Taking? Authorizing Provider  Ascorbic Acid (VITAMIN C) 100 MG tablet Take 100 mg by mouth daily.   Yes Historical Provider, MD  Multiple Vitamin (MULTIVITAMIN) capsule Take 1 capsule by mouth daily.   Yes Historical Provider, MD  ciprofloxacin (CIPRO) 500 MG tablet Take 1 tablet (500 mg total) by mouth 2 (two) times daily. One po bid x 7 days 06/18/13  [name], MD  cyclobenzaprine (FLEXERIL) 10 MG tablet Take 1 tablet (10 mg total) by mouth 2 (two) times daily as needed for muscle spasms. 07/25/13   Hope Orlene Och, NP  glipiZIDE (GLUCOTROL) 5 MG tablet Take 1 tablet (5 mg total) by mouth 2 (two) times daily before a meal. 09/22/12   [name], MD  HYDROcodone-acetaminophen (NORCO/VICODIN) 5-325 MG per tablet Take 1 tablet by mouth every 4 (four) hours as needed. 07/25/13   Hope Orlene Och, NP  HYDROcodone-acetaminophen (VICODIN) 5-500 MG per tablet Take 1 tablet by  mouth every 6 (six) hours as needed. For pain.    Historical Provider, MD  metFORMIN (GLUCOPHAGE) 1000 MG tablet Take 1,000 mg by mouth 2 (two) times daily with a meal.      Historical Provider, MD  metFORMIN (GLUCOPHAGE) 1000 MG tablet Take 1 tablet (1,000 mg total) by mouth 2 (two) times daily. 09/22/12   [name], MD  Methenamine-Sodium Salicylate (CYSTEX) 162-162.5 MG TABS Take 2 tablets by mouth 3 (three) times daily as needed. For urinary tract infection    Historical Provider, MD  metroNIDAZOLE (FLAGYL) 500 MG tablet Take 1 tablet (500 mg total) by mouth 2 (two) times daily. One po bid x 7 days 03/13/14   Rolan Bucco, MD  omeprazole (PRILOSEC) 40 MG capsule Take 40 mg by mouth 2 (two) times daily.    Historical Provider, MD   Triage Vitals: BP 143/85 mmHg  Pulse 75  Temp(Src) 98.8 F (37.1 C) (Oral)  Resp 18  Ht 5\' 2"  (1.575 m)  Wt 187 lb (84.823 kg)  BMI 34.19 kg/m2  SpO2 100%  Physical Exam  Constitutional: She is oriented to person, place, and time. She appears well-developed and well-nourished.  HENT:  Head: Normocephalic and atraumatic.  Eyes: Pupils are equal, round, and reactive to light.  Neck: Normal range of motion. Neck supple.  Cardiovascular: Normal rate, regular rhythm and normal heart sounds.   Pulmonary/Chest: Effort normal and breath sounds normal. No respiratory distress. She has no wheezes. She has no rales. She exhibits no tenderness.  Abdominal: Soft. Bowel sounds are normal. There is no tenderness. There is no rebound and no guarding.  Mild tenderness to epigastrium. Mild tenderness to suprapubic area.  Genitourinary: Vaginal discharge found.  Thin white vaginal discharge, mild CMT, no adnexal tenderness  Musculoskeletal: Normal range of motion. She exhibits no edema.  Lymphadenopathy:    She has no cervical adenopathy.  Neurological: She is alert and oriented to person, place, and time.  Skin: Skin is warm and dry. No rash noted.  Psychiatric: She  has a normal mood and affect.  Nursing note and vitals reviewed.  ED Course  Procedures (including critical care time)  DIAGNOSTIC STUDIES: Oxygen Saturation is 100% on RA, normal by my interpretation.    COORDINATION OF CARE: 3:51 PM- Discussed plans to obtain diagnostic lab work and urinalysis. Pt advised of plan for treatment and pt agrees.  Labs Review Results for orders placed or performed during the hospital encounter of 03/13/14  Wet prep, genital  Result Value Ref Range   Yeast Wet Prep HPF POC NONE SEEN NONE SEEN   Trich, Wet Prep NONE SEEN NONE SEEN   Clue Cells Wet Prep HPF POC FEW (A) NONE SEEN   WBC, Wet Prep HPF POC FEW (A) NONE SEEN  Urinalysis, Routine w reflex microscopic  Result Value Ref Range   Color, Urine YELLOW YELLOW   APPearance CLEAR CLEAR   Specific Gravity, Urine  1.014 1.005 - 1.030   pH 5.5 5.0 - 8.0   Glucose, UA NEGATIVE NEGATIVE mg/dL   Hgb urine dipstick NEGATIVE NEGATIVE   Bilirubin Urine NEGATIVE NEGATIVE   Ketones, ur NEGATIVE NEGATIVE mg/dL   Protein, ur NEGATIVE NEGATIVE mg/dL   Urobilinogen, UA 0.2 0.0 - 1.0 mg/dL   Nitrite NEGATIVE NEGATIVE   Leukocytes, UA NEGATIVE NEGATIVE  Comprehensive metabolic panel  Result Value Ref Range   Sodium 141 137 - 147 mEq/L   Potassium 4.0 3.7 - 5.3 mEq/L   Chloride 105 96 - 112 mEq/L   CO2 22 19 - 32 mEq/L   Glucose, Bld 90 70 - 99 mg/dL   BUN 10 6 - 23 mg/dL   Creatinine, Ser 1.610.50 0.50 - 1.10 mg/dL   Calcium 9.3 8.4 - 09.610.5 mg/dL   Total Protein 7.6 6.0 - 8.3 g/dL   Albumin 3.7 3.5 - 5.2 g/dL   AST 15 0 - 37 U/L   ALT 16 0 - 35 U/L   Alkaline Phosphatase 106 39 - 117 U/L   Total Bilirubin <0.2 (L) 0.3 - 1.2 mg/dL   GFR calc non Af Amer >90 >90 mL/min   GFR calc Af Amer >90 >90 mL/min   Anion gap 14 5 - 15  CBC with Differential  Result Value Ref Range   WBC 7.3 4.0 - 10.5 K/uL   RBC 4.34 3.87 - 5.11 MIL/uL   Hemoglobin 12.3 12.0 - 15.0 g/dL   HCT 04.537.4 40.936.0 - 81.146.0 %   MCV 86.2 78.0 -  100.0 fL   MCH 28.3 26.0 - 34.0 pg   MCHC 32.9 30.0 - 36.0 g/dL   RDW 91.413.6 78.211.5 - 95.615.5 %   Platelets 229 150 - 400 K/uL   Neutrophils Relative % 47 43 - 77 %   Neutro Abs 3.3 1.7 - 7.7 K/uL   Lymphocytes Relative 45 12 - 46 %   Lymphs Abs 3.3 0.7 - 4.0 K/uL   Monocytes Relative 6 3 - 12 %   Monocytes Absolute 0.5 0.1 - 1.0 K/uL   Eosinophils Relative 2 0 - 5 %   Eosinophils Absolute 0.2 0.0 - 0.7 K/uL   Basophils Relative 0 0 - 1 %   Basophils Absolute 0.0 0.0 - 0.1 K/uL  Lipase, blood  Result Value Ref Range   Lipase 47 11 - 59 U/L  Pregnancy, urine  Result Value Ref Range   Preg Test, Ur NEGATIVE NEGATIVE   No results found.   Imaging Review No results found.   EKG Interpretation None     MDM   Final diagnoses:  Vaginitis    Pt with dysuria and vag discharge.  U/a neg for infection.  Will tx with flagyl for BV.  Also given rocephin and zithromax.  abd exam non-concerning.  Feel that her vomiting and diarrhea likely related to the start of her Victoza.  Advised her to contact her doctor on Monday regarding this.  Advised to return if symptoms worsen.  Pt refusing rocephin/zithromax.    I personally performed the services described in this documentation, which was scribed in my presence. The recorded information has been reviewed and is accurate.  Rolan BuccoMelanie Vanity Larsson, MD 03/13/14 [id number]  Rolan BuccoMelanie Onisha Cedeno, MD 03/13/14 [id number]

## 2014-03-13 NOTE — ED Notes (Signed)
Pt discharged to home with family. NAD.  

## 2014-03-13 NOTE — ED Notes (Signed)
Patient having lower abd pain and painful, burning urination.

## 2014-03-15 LAB — GC/CHLAMYDIA PROBE AMP
CT PROBE, AMP APTIMA: NEGATIVE
GC PROBE AMP APTIMA: NEGATIVE

## 2014-09-30 ENCOUNTER — Emergency Department (HOSPITAL_BASED_OUTPATIENT_CLINIC_OR_DEPARTMENT_OTHER): Payer: Commercial Managed Care - PPO

## 2014-09-30 ENCOUNTER — Encounter (HOSPITAL_BASED_OUTPATIENT_CLINIC_OR_DEPARTMENT_OTHER): Payer: Self-pay | Admitting: *Deleted

## 2014-09-30 ENCOUNTER — Emergency Department (HOSPITAL_BASED_OUTPATIENT_CLINIC_OR_DEPARTMENT_OTHER)
Admission: EM | Admit: 2014-09-30 | Discharge: 2014-09-30 | Disposition: A | Discharge status: Home / Self Care | Payer: Commercial Managed Care - PPO | Attending: Emergency Medicine | Admitting: Emergency Medicine

## 2014-09-30 DIAGNOSIS — S161XXA Strain of muscle, fascia and tendon at neck level, initial encounter: Secondary | ICD-10-CM

## 2014-09-30 DIAGNOSIS — S299XXA Unspecified injury of thorax, initial encounter: Secondary | ICD-10-CM | POA: Insufficient documentation

## 2014-09-30 DIAGNOSIS — Z79899 Other long term (current) drug therapy: Secondary | ICD-10-CM | POA: Insufficient documentation

## 2014-09-30 DIAGNOSIS — Z8781 Personal history of (healed) traumatic fracture: Secondary | ICD-10-CM | POA: Insufficient documentation

## 2014-09-30 DIAGNOSIS — M546 Pain in thoracic spine: Secondary | ICD-10-CM

## 2014-09-30 DIAGNOSIS — S0285XA Fracture of orbit, unspecified, initial encounter for closed fracture: Secondary | ICD-10-CM

## 2014-09-30 DIAGNOSIS — Y9289 Other specified places as the place of occurrence of the external cause: Secondary | ICD-10-CM | POA: Insufficient documentation

## 2014-09-30 DIAGNOSIS — Z720 Tobacco use: Secondary | ICD-10-CM | POA: Insufficient documentation

## 2014-09-30 DIAGNOSIS — S01111A Laceration without foreign body of right eyelid and periocular area, initial encounter: Secondary | ICD-10-CM

## 2014-09-30 DIAGNOSIS — S023XXA Fracture of orbital floor, initial encounter for closed fracture: Secondary | ICD-10-CM | POA: Insufficient documentation

## 2014-09-30 DIAGNOSIS — Y998 Other external cause status: Secondary | ICD-10-CM | POA: Insufficient documentation

## 2014-09-30 DIAGNOSIS — E119 Type 2 diabetes mellitus without complications: Secondary | ICD-10-CM | POA: Insufficient documentation

## 2014-09-30 DIAGNOSIS — Y9389 Activity, other specified: Secondary | ICD-10-CM | POA: Insufficient documentation

## 2014-09-30 DIAGNOSIS — J45909 Unspecified asthma, uncomplicated: Secondary | ICD-10-CM | POA: Insufficient documentation

## 2014-09-30 MED ORDER — LIDOCAINE-EPINEPHRINE 2 %-1:100000 IJ SOLN
20.0000 mL | Freq: Once | INTRAMUSCULAR | Status: DC
  Filled 2014-09-30: qty 1

## 2014-09-30 MED ORDER — HYDROCODONE-ACETAMINOPHEN 5-325 MG PO TABS
2.0000 | ORAL_TABLET | Freq: Once | ORAL | Status: AC
  Administered 2014-09-30: 2 via ORAL
  Filled 2014-09-30: qty 2

## 2014-09-30 MED ORDER — HYDROCODONE-ACETAMINOPHEN 5-325 MG PO TABS: 1.0000 | ORAL_TABLET | ORAL | Status: DC | PRN
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
## 2014-09-30 NOTE — Discharge Instructions (Signed)
Have your sutures removed in 5 days Facial Laceration  A facial laceration is a cut on the face. These injuries can be painful and cause bleeding. Lacerations usually heal quickly, but they need special care to reduce scarring. DIAGNOSIS  Your health care provider will take a medical history, ask for details about how the injury occurred, and examine the wound to determine how deep the cut is. TREATMENT  Some facial lacerations may not require closure. Others may not be able to be closed because of an increased risk of infection. The risk of infection and the chance for successful closure will depend on various factors, including the amount of time since the injury occurred. The wound may be cleaned to help prevent infection. If closure is appropriate, pain medicines may be given if needed. Your health care provider will use stitches (sutures), wound glue (adhesive), or skin adhesive strips to repair the laceration. These tools bring the skin edges together to allow for faster healing and a better cosmetic outcome. If needed, you may also be given a tetanus shot. HOME CARE INSTRUCTIONS  Only take over-the-counter or prescription medicines as directed by your health care provider.  Follow your health care provider's instructions for wound care. These instructions will vary depending on the technique used for closing the wound. For Sutures:  Keep the wound clean and dry.   If you were given a bandage (dressing), you should change it at least once a day. Also change the dressing if it becomes wet or dirty, or as directed by your health care provider.   Wash the wound with soap and water 2 times a day. Rinse the wound off with water to remove all soap. Pat the wound dry with a clean towel.   After cleaning, apply a thin layer of the antibiotic ointment recommended by your health care provider. This will help prevent infection and keep the dressing from sticking.   You may shower as usual after  the first 24 hours. Do not soak the wound in water until the sutures are removed.   Get your sutures removed as directed by your health care provider. With facial lacerations, sutures should usually be taken out after 4-5 days to avoid stitch marks.   Wait a few days after your sutures are removed before applying any makeup. For Skin Adhesive Strips:  Keep the wound clean and dry.   Do not get the skin adhesive strips wet. You may bathe carefully, using caution to keep the wound dry.   If the wound gets wet, pat it dry with a clean towel.   Skin adhesive strips will fall off on their own. You may trim the strips as the wound heals. Do not remove skin adhesive strips that are still stuck to the wound. They will fall off in time.  For Wound Adhesive:  You may briefly wet your wound in the shower or bath. Do not soak or scrub the wound. Do not swim. Avoid periods of heavy sweating until the skin adhesive has fallen off on its own. After showering or bathing, gently pat the wound dry with a clean towel.   Do not apply liquid medicine, cream medicine, ointment medicine, or makeup to your wound while the skin adhesive is in place. This may loosen the film before your wound is healed.   If a dressing is placed over the wound, be careful not to apply tape directly over the skin adhesive. This may cause the adhesive to be pulled off before  the wound is healed.   Avoid prolonged exposure to sunlight or tanning lamps while the skin adhesive is in place.  The skin adhesive will usually remain in place for 5-10 days, then naturally fall off the skin. Do not pick at the adhesive film.  After Healing: Once the wound has healed, cover the wound with sunscreen during the day for 1 full year. This can help minimize scarring. Exposure to ultraviolet light in the first year will darken the scar. It can take 1-2 years for the scar to lose its redness and to heal completely.  SEEK IMMEDIATE MEDICAL  CARE IF:  You have redness, pain, or swelling around the wound.   You see ayellowish-white fluid (pus) coming from the wound.   You have chills or a fever.  MAKE SURE YOU:  Understand these instructions.  Will watch your condition.  Will get help right away if you are not doing well or get worse. Document Released: 05/24/2004 Document Revised: 02/04/2013 Document Reviewed: 11/27/2012 [facility] Patient Information 2015 [place], [place]. This information is not intended to replace advice given to you by your health care provider. Make sure you discuss any questions you have with your health care provider.  Facial Fracture A facial fracture is a break in one of the bones of your face. HOME CARE INSTRUCTIONS   Protect the injured part of your face until it is healed.  Do not participate in activities which give chance for re-injury until your doctor approves.  Gently wash and dry your face.  Wear head and facial protection while riding a bicycle, motorcycle, or snowmobile. SEEK MEDICAL CARE IF:   An oral temperature above 102 F (38.9 C) develops.  You have severe headaches or notice changes in your vision.  You have new numbness or tingling in your face.  You develop nausea (feeling sick to your stomach), vomiting or a stiff neck. SEEK IMMEDIATE MEDICAL CARE IF:   You develop difficulty seeing or experience double vision.  You become dizzy, lightheaded, or faint.  You develop trouble speaking, breathing, or swallowing.  You have a watery discharge from your nose or ear. MAKE SURE YOU:   Understand these instructions.  Will watch your condition.  Will get help right away if you are not doing well or get worse. Document Released: 04/16/2005 Document Revised: 07/09/2011 Document Reviewed: 12/04/2007 [facility] Patient Information 2015 [place], [place]. This information is not intended to replace advice given to you by your health care provider. Make sure you discuss  any questions you have with your health care provider.  Sutured Wound Care Sutures are stitches that can be used to close wounds. Caring for your wound can help stop infection and lessen pain. HOME CARE   Rest and raise (elevate) the injured area until the pain and puffiness (swelling) go away.  Only take medicines as told by your doctor.  Clean the wound gently with mild soap and water once a day after the first 2 days. Rinse off the soap. Pat the area dry with a clean towel. Do not rub the wound.  Change the bandage (dressing) as told by your doctor. If the bandage sticks, soak it off with soapy water. Stop using a bandage after 2 days or after the wound stops leaking fluid.  Put cream on the wound as told by your doctor.  Do not stretch the wound.  Drink enough fluids to keep your pee (urine) clear or pale yellow.  See your doctor to have the sutures removed.  Use sunscreen or sunblock on the wound after it heals. GET HELP RIGHT AWAY IF:   Your wound gets red, puffy, hot, or tender.  You have more pain in the wound.  You have a red streak that goes away from the wound.  You see yellowish-white fluid (pus) coming out of the wound.  You have a fever.  You have chills and start to shake.  You notice a bad smell coming from the wound.  Your wound will not stop bleeding. MAKE SURE YOU:   Understand these instructions.  Will watch your condition.  Will get help right away if you are not doing well or get worse. Document Released: 10/03/2007 Document Revised: 07/09/2011 Document Reviewed: 08/20/2010 [facility] Patient Information 2015 [place], [place]. This information is not intended to replace advice given to you by your health care provider. Make sure you discuss any questions you have with your health care provider.
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
## 2014-10-07 ENCOUNTER — Emergency Department (HOSPITAL_BASED_OUTPATIENT_CLINIC_OR_DEPARTMENT_OTHER)
Admission: EM | Admit: 2014-10-07 | Discharge: 2014-10-07 | Disposition: A | Discharge status: Home / Self Care | Payer: Commercial Managed Care - PPO | Attending: Emergency Medicine | Admitting: Emergency Medicine

## 2014-10-07 ENCOUNTER — Encounter (HOSPITAL_BASED_OUTPATIENT_CLINIC_OR_DEPARTMENT_OTHER): Payer: Self-pay

## 2014-10-07 DIAGNOSIS — E119 Type 2 diabetes mellitus without complications: Secondary | ICD-10-CM | POA: Insufficient documentation

## 2014-10-07 DIAGNOSIS — Z720 Tobacco use: Secondary | ICD-10-CM | POA: Insufficient documentation

## 2014-10-07 DIAGNOSIS — Z8781 Personal history of (healed) traumatic fracture: Secondary | ICD-10-CM | POA: Insufficient documentation

## 2014-10-07 DIAGNOSIS — H1131 Conjunctival hemorrhage, right eye: Secondary | ICD-10-CM | POA: Insufficient documentation

## 2014-10-07 DIAGNOSIS — J45909 Unspecified asthma, uncomplicated: Secondary | ICD-10-CM | POA: Insufficient documentation

## 2014-10-07 DIAGNOSIS — Z79899 Other long term (current) drug therapy: Secondary | ICD-10-CM | POA: Insufficient documentation

## 2014-10-07 DIAGNOSIS — Z4802 Encounter for removal of sutures: Secondary | ICD-10-CM
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
## 2014-10-07 NOTE — ED Notes (Signed)
For suture removal to right eyebrow

## 2014-10-07 NOTE — Discharge Instructions (Signed)

## 2014-10-12 DIAGNOSIS — S0231XD Fracture of orbital floor, right side, subsequent encounter for fracture with routine healing: Secondary | ICD-10-CM

## 2014-10-12 HISTORY — DX: Fracture of orbital floor, right side, subsequent encounter for fracture with routine healing: S02.31XD

## 2014-12-12 ENCOUNTER — Encounter (HOSPITAL_BASED_OUTPATIENT_CLINIC_OR_DEPARTMENT_OTHER): Payer: Self-pay | Admitting: Emergency Medicine

## 2014-12-12 ENCOUNTER — Emergency Department (HOSPITAL_BASED_OUTPATIENT_CLINIC_OR_DEPARTMENT_OTHER)
Admission: EM | Admit: 2014-12-12 | Discharge: 2014-12-12 | Disposition: A | Discharge status: Home / Self Care | Payer: Commercial Managed Care - PPO | Attending: Emergency Medicine | Admitting: Emergency Medicine

## 2014-12-12 DIAGNOSIS — Z3202 Encounter for pregnancy test, result negative: Secondary | ICD-10-CM | POA: Insufficient documentation

## 2014-12-12 DIAGNOSIS — B373 Candidiasis of vulva and vagina: Secondary | ICD-10-CM | POA: Insufficient documentation

## 2014-12-12 DIAGNOSIS — Z720 Tobacco use: Secondary | ICD-10-CM | POA: Insufficient documentation

## 2014-12-12 DIAGNOSIS — R197 Diarrhea, unspecified: Secondary | ICD-10-CM | POA: Insufficient documentation

## 2014-12-12 DIAGNOSIS — J45909 Unspecified asthma, uncomplicated: Secondary | ICD-10-CM | POA: Insufficient documentation

## 2014-12-12 DIAGNOSIS — N390 Urinary tract infection, site not specified: Secondary | ICD-10-CM

## 2014-12-12 DIAGNOSIS — Z79899 Other long term (current) drug therapy: Secondary | ICD-10-CM | POA: Insufficient documentation

## 2014-12-12 DIAGNOSIS — E119 Type 2 diabetes mellitus without complications: Secondary | ICD-10-CM | POA: Insufficient documentation

## 2014-12-12 DIAGNOSIS — R1110 Vomiting, unspecified: Secondary | ICD-10-CM | POA: Insufficient documentation

## 2014-12-12 DIAGNOSIS — B9689 Other specified bacterial agents as the cause of diseases classified elsewhere: Secondary | ICD-10-CM

## 2014-12-12 DIAGNOSIS — N760 Acute vaginitis: Secondary | ICD-10-CM | POA: Insufficient documentation

## 2014-12-12 DIAGNOSIS — Z8781 Personal history of (healed) traumatic fracture: Secondary | ICD-10-CM | POA: Insufficient documentation

## 2014-12-12 DIAGNOSIS — B379 Candidiasis, unspecified: Secondary | ICD-10-CM

## 2014-12-12 LAB — URINALYSIS, ROUTINE W REFLEX MICROSCOPIC
BILIRUBIN URINE: NEGATIVE
GLUCOSE, UA: NEGATIVE mg/dL
Hgb urine dipstick: NEGATIVE
Ketones, ur: NEGATIVE mg/dL
Leukocytes, UA: NEGATIVE
Nitrite: POSITIVE — AB
PH: 5.5 (ref 5.0–8.0)
PROTEIN: NEGATIVE mg/dL
Specific Gravity, Urine: 1.027 (ref 1.005–1.030)
UROBILINOGEN UA: 1 mg/dL (ref 0.0–1.0)

## 2014-12-12 LAB — CBC WITH DIFFERENTIAL/PLATELET
BASOS ABS: 0 10*3/uL (ref 0.0–0.1)
BASOS PCT: 1 % (ref 0–1)
EOS PCT: 2 % (ref 0–5)
Eosinophils Absolute: 0.2 10*3/uL (ref 0.0–0.7)
HCT: 39 % (ref 36.0–46.0)
Hemoglobin: 13 g/dL (ref 12.0–15.0)
LYMPHS PCT: 44 % (ref 12–46)
Lymphs Abs: 3.6 10*3/uL (ref 0.7–4.0)
MCH: 29.8 pg (ref 26.0–34.0)
MCHC: 33.3 g/dL (ref 30.0–36.0)
MCV: 89.4 fL (ref 78.0–100.0)
MONO ABS: 0.5 10*3/uL (ref 0.1–1.0)
MONOS PCT: 6 % (ref 3–12)
Neutro Abs: 4 10*3/uL (ref 1.7–7.7)
Neutrophils Relative %: 47 % (ref 43–77)
Platelets: 243 10*3/uL (ref 150–400)
RBC: 4.36 MIL/uL (ref 3.87–5.11)
RDW: 13.1 % (ref 11.5–15.5)
WBC: 8.3 10*3/uL (ref 4.0–10.5)

## 2014-12-12 LAB — BASIC METABOLIC PANEL
Anion gap: 10 (ref 5–15)
BUN: 11 mg/dL (ref 6–20)
CHLORIDE: 106 mmol/L (ref 101–111)
CO2: 24 mmol/L (ref 22–32)
Calcium: 8.9 mg/dL (ref 8.9–10.3)
Creatinine, Ser: 0.52 mg/dL (ref 0.44–1.00)
GFR calc non Af Amer: >60 mL/min (ref >60)
Glucose, Bld: 155 mg/dL — ABNORMAL HIGH (ref 65–99)
POTASSIUM: 3.7 mmol/L (ref 3.5–5.1)
Sodium: 140 mmol/L (ref 135–145)

## 2014-12-12 LAB — WET PREP, GENITAL: Trich, Wet Prep: NONE SEEN

## 2014-12-12 LAB — PREGNANCY, URINE: PREG TEST UR: NEGATIVE

## 2014-12-12 MED ORDER — METRONIDAZOLE 500 MG PO TABS: 500.0000 mg | ORAL_TABLET | Freq: Two times a day (BID) | ORAL | Status: DC

## 2014-12-12 MED ORDER — FLUCONAZOLE 150 MG PO TABS: 150.0000 mg | ORAL_TABLET | Freq: Every day | ORAL | Status: DC

## 2014-12-12 MED ORDER — ONDANSETRON 8 MG PO TBDP
8.0000 mg | ORAL_TABLET | Freq: Once | ORAL | Status: AC
  Administered 2014-12-12: 8 mg via ORAL
  Filled 2014-12-12: qty 1

## 2014-12-12 MED ORDER — ONDANSETRON 4 MG PO TBDP: 4.0000 mg | ORAL_TABLET | Freq: Three times a day (TID) | ORAL | Status: DC | PRN

## 2014-12-12 MED ORDER — CEPHALEXIN 500 MG PO CAPS: 500.0000 mg | ORAL_CAPSULE | Freq: Two times a day (BID) | ORAL | Status: DC

## 2014-12-12 NOTE — ED Provider Notes (Signed)
CSN: [id number]     Arrival date & time 12/12/14  1645 History   First MD Initiated Contact with Patient 12/12/14 1704     Chief Complaint  Patient presents with  . Emesis  . Diarrhea  . Abdominal Pain  . Vaginal Discharge   HPI  Mrs. Karen Patrick is a 49yo female presenting today with nausea, vomiting, diarrhea, abdominal pain, and vaginal discharge. Has had nausea, vomiting, and diarrhea for approximately one week. These symptoms appear to be improving. Son had similar symptoms. Has appetite today for first time in one week. Notes fever x1 week.  Has had vaginal discharge x2 days and abdominal pain in suprapubic area. Unable to describe color of discharge. States it has a bad smell. Complains of increased frequency and burning with urination x3 days. Reports history of   Past Medical History  Diagnosis Date  . Diabetes mellitus   . Asthma   . Ankle fracture, right    Past Surgical History  Procedure Laterality Date  . Hammer toe surgery    . Lipoma excision    . Knee arthrocentesis    . Foot fasciotomy    . Endometrial ablation w/ novasure    . Back surgery    . Lipoma removed     Family History  Problem Relation Age of Onset  . Stroke Mother   . Diabetes Mother   . Hypertension Mother   . Stroke Father   . Sudden death Father   . Hypertension Father   . Hyperlipidemia Father   . Heart attack Father   . Diabetes Father   . Diabetes Maternal Grandmother   . Heart attack Maternal Grandmother   . Hypertension Maternal Grandmother   . Diabetes Paternal Grandmother   . Sudden death Paternal Grandmother    Social History  Substance Use Topics  . Smoking status: Current Every Day Smoker    Last Attempt to Quit: 04/29/2012  . Smokeless tobacco: Never Used  . Alcohol Use: No   OB History    No data available     Review of Systems    Allergies  Review of patient's allergies indicates no known allergies.  Home Medications   Prior to Admission medications    Medication Sig Start Date End Date Taking? Authorizing Provider  atorvastatin (LIPITOR) 10 MG tablet Take 10 mg by mouth daily.   Yes Historical Provider, MD  Ascorbic Acid (VITAMIN C) 100 MG tablet Take 100 mg by mouth daily.    Historical Provider, MD  cephALEXin (KEFLEX) 500 MG capsule Take 1 capsule (500 mg total) by mouth 2 (two) times daily. 12/12/14   Karen Gres N Karen Darden, DO  fluconazole (DIFLUCAN) 150 MG tablet Take 1 tablet (150 mg total) by mouth daily. 12/12/14   Karen N Karen Gillaspie, DO  HYDROcodone-acetaminophen (NORCO/VICODIN) 5-325 MG per tablet Take 1-2 tablets by mouth every 4 (four) hours as needed. 09/30/14   Karen Lower, NP  Liraglutide (VICTOZA [place]) Inject into the skin.    Historical Provider, MD  metFORMIN (GLUCOPHAGE) 1000 MG tablet Take 1,000 mg by mouth 2 (two) times daily with a meal.      Historical Provider, MD  metFORMIN (GLUCOPHAGE) 1000 MG tablet Take 1 tablet (1,000 mg total) by mouth 2 (two) times daily. 09/22/12   Karen Lyons, MD  metroNIDAZOLE (FLAGYL) 500 MG tablet Take 1 tablet (500 mg total) by mouth 2 (two) times daily. 12/12/14   Karen N Seann Genther, DO  Multiple Vitamin (MULTIVITAMIN) capsule Take 1 capsule by mouth  daily.    Historical Provider, MD   BP 127/80 mmHg  Pulse 82  Temp(Src) 98.3 F (36.8 C) (Oral)  Resp 20  Ht  (1.575 m)  Wt 186 lb (84.369 kg)  BMI 34.01 kg/m2  SpO2 99% Physical Exam  ED Course  Procedures (including critical care time) Labs Review Labs Reviewed  WET PREP, GENITAL - Abnormal; Notable for the following:    Yeast Wet Prep HPF POC MANY (*)    Clue Cells Wet Prep HPF POC MODERATE (*)    WBC, Wet Prep HPF POC TOO NUMEROUS TO COUNT (*)    All other components within normal limits  URINALYSIS, ROUTINE W REFLEX MICROSCOPIC (NOT AT [facility]) - Abnormal; Notable for the following:    APPearance CLOUDY (*)    Nitrite POSITIVE (*)    All other components within normal limits  BASIC METABOLIC PANEL - Abnormal; Notable for the  following:    Glucose, Bld 155 (*)    All other components within normal limits  URINE CULTURE  PREGNANCY, URINE  CBC WITH DIFFERENTIAL/PLATELET  HIV ANTIBODY (ROUTINE TESTING)  RPR  URINE MICROSCOPIC-ADD ON  GC/CHLAMYDIA PROBE AMP ([place]) NOT AT [facility]    Imaging Review No results found. I, Karen Patrick, personally reviewed and evaluated these images and lab results as part of my medical decision-making.   EKG Interpretation None      MDM   Final diagnoses:  Yeast infection  Bacterial vaginosis  UTI (Patrick urinary tract infection)  Will obtain BMP, CBC, and Urinalysis. Will obtain wet prep, GC/Chlamydia, RPR, and HIV. Wet prep with many yeast, moderate clue cells, and too numerous to count WBC. BMP with elevated glucose 155. CBC normal. Urinalysis with positive nitrite, but negative for leukocytes. Pregnancy test negative. Stable for discharge. HIV, RPR, GC/Chlamydia, and Urine Culture pending at discharge. Will give diflucan for yeast infection. Will give Metronidazole for bacterial vaginosis. Will give Keflex for UTI. Follow up with PCP.    [place], [place] 12/12/14 [address] [place], [place] 12/12/14 1821  Karen Nay, MD 12/12/14 [phone]

## 2014-12-12 NOTE — ED Notes (Signed)
Rx x 4 given for keflex, zofran, flagyl and diflucan

## 2014-12-12 NOTE — ED Notes (Signed)
Patient states that she is having pain and N/V/D x 1 week. Today started with vag discharge

## 2014-12-12 NOTE — Discharge Instructions (Signed)
Bacterial Vaginosis Bacterial vaginosis is a vaginal infection that occurs when the normal balance of bacteria in the vagina is disrupted. It results from an overgrowth of certain bacteria. This is the most common vaginal infection in women of childbearing age. Treatment is important to prevent complications, especially in pregnant women, as it can cause a premature delivery. CAUSES  Bacterial vaginosis is caused by an increase in harmful bacteria that are normally present in smaller amounts in the vagina. Several different kinds of bacteria can cause bacterial vaginosis. However, the reason that the condition develops is not fully understood. RISK FACTORS Certain activities or behaviors can put you at an increased risk of developing bacterial vaginosis, including:  Having a new sex partner or multiple sex partners.  Douching.  Using an intrauterine device (IUD) for contraception. Women do not get bacterial vaginosis from toilet seats, bedding, swimming pools, or contact with objects around them. SIGNS AND SYMPTOMS  Some women with bacterial vaginosis have no signs or symptoms. Common symptoms include:  Grey vaginal discharge.  A fishlike odor with discharge, especially after sexual intercourse.  Itching or burning of the vagina and vulva.  Burning or pain with urination. DIAGNOSIS  Your health care provider will take a medical history and examine the vagina for signs of bacterial vaginosis. A sample of vaginal fluid may be taken. Your health care provider will look at this sample under a microscope to check for bacteria and abnormal cells. A vaginal pH test may also be done.  TREATMENT  Bacterial vaginosis may be treated with antibiotic medicines. These may be given in the form of a pill or a vaginal cream. A second round of antibiotics may be prescribed if the condition comes back after treatment.  HOME CARE INSTRUCTIONS   Only take over-the-counter or prescription medicines as  directed by your health care provider.  If antibiotic medicine was prescribed, take it as directed. Make sure you finish it even if you start to feel better.  Do not have sex until treatment is completed.  Tell all sexual partners that you have a vaginal infection. They should see their health care provider and be treated if they have problems, such as a mild rash or itching.  Practice safe sex by using condoms and only having one sex partner. SEEK MEDICAL CARE IF:   Your symptoms are not improving after 3 days of treatment.  You have increased discharge or pain.  You have a fever. MAKE SURE YOU:   Understand these instructions.  Will watch your condition.  Will get help right away if you are not doing well or get worse. FOR MORE INFORMATION  Centers for Disease Control and Prevention, Division of STD Prevention: www.cdc.gov/std American Sexual Health Association (ASHA): www.ashastd.org  Document Released: 04/16/2005 Document Revised: 02/04/2013 Document Reviewed: 11/26/2012 ExitCare Patient Information 2015 [employer]. This information is not intended to replace advice given to you by your health care provider. Make sure you discuss any questions you have with your health care provider.  

## 2014-12-14 LAB — RPR: RPR Ser Ql: NONREACTIVE

## 2014-12-14 LAB — GC/CHLAMYDIA PROBE AMP (~~LOC~~) NOT AT ARMC
Chlamydia: NEGATIVE
Neisseria Gonorrhea: NEGATIVE

## 2014-12-14 LAB — HIV ANTIBODY (ROUTINE TESTING W REFLEX): HIV SCREEN 4TH GENERATION: NONREACTIVE

## 2014-12-15 LAB — URINE CULTURE: Culture: >=100000

## 2014-12-16 ENCOUNTER — Telehealth (HOSPITAL_BASED_OUTPATIENT_CLINIC_OR_DEPARTMENT_OTHER): Payer: Self-pay | Admitting: Emergency Medicine

## 2014-12-16 NOTE — Telephone Encounter (Addendum)
Post ED Visit - Positive Culture Follow-up  Culture report reviewed by antimicrobial stewardship pharmacist: []  Karen Patrick, Pharm.D., BCPS []  Karen Patrick, Pharm.D., BCPS []  Karen Patrick, Pharm.D., BCPS []  Karen Patrick, [address].D., BCPS, AAHIVP []  Karen Patrick, Pharm.D., BCPS, AAHIVP []  Karen Patrick, [address].D., BCPS T. Stone PharmD  Positive urine culture E. coli Treated with flucanazole, flagyl, cephalexin and no further patient follow-up is required at this time.  Berle Mull 12/16/2014, 9:37 AM

## 2015-03-14 ENCOUNTER — Emergency Department (HOSPITAL_COMMUNITY): Payer: BLUE CROSS/BLUE SHIELD

## 2015-03-14 ENCOUNTER — Emergency Department (HOSPITAL_COMMUNITY)
Admission: EM | Admit: 2015-03-14 | Discharge: 2015-03-14 | Disposition: A | Discharge status: Home / Self Care | Payer: BLUE CROSS/BLUE SHIELD | Attending: Emergency Medicine | Admitting: Emergency Medicine

## 2015-03-14 ENCOUNTER — Encounter (HOSPITAL_COMMUNITY): Payer: Self-pay | Admitting: Emergency Medicine

## 2015-03-14 DIAGNOSIS — E119 Type 2 diabetes mellitus without complications: Secondary | ICD-10-CM | POA: Diagnosis not present

## 2015-03-14 DIAGNOSIS — K529 Noninfective gastroenteritis and colitis, unspecified: Secondary | ICD-10-CM | POA: Diagnosis not present

## 2015-03-14 DIAGNOSIS — R110 Nausea: Secondary | ICD-10-CM | POA: Insufficient documentation

## 2015-03-14 DIAGNOSIS — J45909 Unspecified asthma, uncomplicated: Secondary | ICD-10-CM | POA: Insufficient documentation

## 2015-03-14 DIAGNOSIS — Z8781 Personal history of (healed) traumatic fracture: Secondary | ICD-10-CM | POA: Insufficient documentation

## 2015-03-14 DIAGNOSIS — Z79899 Other long term (current) drug therapy: Secondary | ICD-10-CM | POA: Diagnosis not present

## 2015-03-14 DIAGNOSIS — R531 Weakness: Secondary | ICD-10-CM | POA: Diagnosis present

## 2015-03-14 DIAGNOSIS — H538 Other visual disturbances: Secondary | ICD-10-CM

## 2015-03-14 DIAGNOSIS — M545 Low back pain, unspecified: Secondary | ICD-10-CM

## 2015-03-14 DIAGNOSIS — F17200 Nicotine dependence, unspecified, uncomplicated: Secondary | ICD-10-CM | POA: Diagnosis not present

## 2015-03-14 LAB — COMPREHENSIVE METABOLIC PANEL
ALBUMIN: 3.9 g/dL (ref 3.5–5.0)
ALT: 20 U/L (ref 14–54)
ANION GAP: 8 (ref 5–15)
AST: 18 U/L (ref 15–41)
Alkaline Phosphatase: 75 U/L (ref 38–126)
BUN: 10 mg/dL (ref 6–20)
CALCIUM: 9.1 mg/dL (ref 8.9–10.3)
CO2: 21 mmol/L — AB (ref 22–32)
Chloride: 109 mmol/L (ref 101–111)
Creatinine, Ser: 0.5 mg/dL (ref 0.44–1.00)
GFR calc Af Amer: >60 mL/min (ref >60)
GFR calc non Af Amer: >60 mL/min (ref >60)
GLUCOSE: 146 mg/dL — AB (ref 65–99)
Potassium: 4.3 mmol/L (ref 3.5–5.1)
SODIUM: 138 mmol/L (ref 135–145)
Total Bilirubin: 0.5 mg/dL (ref 0.3–1.2)
Total Protein: 6.9 g/dL (ref 6.5–8.1)

## 2015-03-14 LAB — CBC WITH DIFFERENTIAL/PLATELET
Basophils Absolute: 0 10*3/uL (ref 0.0–0.1)
Basophils Relative: 0 %
Eosinophils Absolute: 0.1 10*3/uL (ref 0.0–0.7)
Eosinophils Relative: 1 %
HEMATOCRIT: 41.8 % (ref 36.0–46.0)
HEMOGLOBIN: 13.8 g/dL (ref 12.0–15.0)
Lymphocytes Relative: 30 %
Lymphs Abs: 2.3 10*3/uL (ref 0.7–4.0)
MCH: 29.6 pg (ref 26.0–34.0)
MCHC: 33 g/dL (ref 30.0–36.0)
MCV: 89.7 fL (ref 78.0–100.0)
MONOS PCT: 5 %
Monocytes Absolute: 0.4 10*3/uL (ref 0.1–1.0)
NEUTROS ABS: 4.8 10*3/uL (ref 1.7–7.7)
NEUTROS PCT: 64 %
Platelets: 207 10*3/uL (ref 150–400)
RBC: 4.66 MIL/uL (ref 3.87–5.11)
RDW: 13.1 % (ref 11.5–15.5)
WBC: 7.5 10*3/uL (ref 4.0–10.5)

## 2015-03-14 LAB — URINALYSIS, ROUTINE W REFLEX MICROSCOPIC
Bilirubin Urine: NEGATIVE
GLUCOSE, UA: NEGATIVE mg/dL
HGB URINE DIPSTICK: NEGATIVE
Ketones, ur: NEGATIVE mg/dL
Leukocytes, UA: NEGATIVE
Nitrite: NEGATIVE
Protein, ur: NEGATIVE mg/dL
SPECIFIC GRAVITY, URINE: 1.016 (ref 1.005–1.030)
Urobilinogen, UA: 0.2 mg/dL (ref 0.0–1.0)
pH: 6 (ref 5.0–8.0)

## 2015-03-14 LAB — LIPASE, BLOOD: Lipase: 34 U/L (ref 11–51)

## 2015-03-14 MED ORDER — ONDANSETRON 8 MG PO TBDP: ORAL_TABLET | ORAL | Status: DC

## 2015-03-14 MED ORDER — KETOROLAC TROMETHAMINE 30 MG/ML IJ SOLN
30.0000 mg | Freq: Once | INTRAMUSCULAR | Status: AC
  Administered 2015-03-14: 30 mg via INTRAVENOUS
  Filled 2015-03-14: qty 1

## 2015-03-14 MED ORDER — SODIUM CHLORIDE 0.9 % IV BOLUS (SEPSIS)
1000.0000 mL | Freq: Once | INTRAVENOUS | Status: AC
  Administered 2015-03-14: 1000 mL via INTRAVENOUS

## 2015-03-14 MED ORDER — ONDANSETRON HCL 4 MG/2ML IJ SOLN
4.0000 mg | Freq: Once | INTRAMUSCULAR | Status: AC
  Administered 2015-03-14: 4 mg via INTRAVENOUS
  Filled 2015-03-14: qty 2

## 2015-03-14 NOTE — ED Notes (Signed)
Pt complaining of weakness for 2 weeks and vomiting since last night. Pt states she has had diarrhea and back pain for 4 days. Pt has taken ibuprofen, tylenol, naproxen and no change in her back pain. Pt has had blurry vision for the past hour and a half. No neuro deficits. Pt has appointment with MD today. BP 136/90, HR 84, 99% RA, CBG 152.

## 2015-03-14 NOTE — ED Notes (Signed)
Pts vital signs updated and IV removed. Pt awaiting discharge paperwork at bedside.

## 2015-03-14 NOTE — ED Notes (Signed)
Pt remains monitored by blood pressure and pulse ox.  

## 2015-03-14 NOTE — ED Notes (Signed)
Pt placed on monitor upon arrival to room. Pt monitored by blood pressure and pulse ox.  

## 2015-03-14 NOTE — Discharge Instructions (Signed)
Zofran as prescribed as needed for nausea.  Clear liquids as tolerated for the next 12 hours, then slowly advance your diet to normal.  Return to the emergency department if symptoms significantly worsen or change.   Blurred Vision Having blurred vision means that you cannot see things clearly. Your vision may seem fuzzy or out of focus. Blurred vision is a very common symptom of an eye or vision problem. Blurred vision is often a gradual blur that occurs in one eye or both eyes. There are many causes of blurred vision, including cataracts, macular degeneration, and diabetic retinopathy. Blurred vision can be diagnosed based on your symptoms and a physical exam. Tell your health care provider about any other health problems you have, any recent eye injury, and any prior surgeries. You may need to see a health care provider who specializes in eye problems (ophthalmologist). Your treatment depends on what is causing your blurred vision.  HOME CARE INSTRUCTIONS  Tell your health care provider about any changes in your blurred vision.  Do not drive or operate heavy machinery if your vision is blurry.  Keep all follow-up visits as directed by your health care provider. This is important. SEEK MEDICAL CARE IF:  Your symptoms get worse.  You have new symptoms.  You have trouble seeing at night.  You have trouble seeing up close or far away.  You have trouble noticing the difference between colors. SEEK IMMEDIATE MEDICAL CARE IF:  You have severe eye pain.  You have a severe headache.  You have flashing lights in your field of vision.  You have a sudden change in vision.  You have a sudden loss of vision.  You have vision change after an injury.  You notice drainage coming from your eyes.  You notice a rash around your eyes.   This information is not intended to replace advice given to you by your health care provider. Make sure you discuss any questions you have with your  health care provider.   Document Released: 04/19/2003 Document Revised: 08/31/2014 Document Reviewed: 03/10/2014 Elsevier Interactive Patient Education [employer]

## 2015-03-14 NOTE — ED Provider Notes (Signed)
CSN: [id number]     Arrival date & time 03/14/15  0815 History   First MD Initiated Contact with Patient 03/14/15 [phone]     Chief Complaint  Patient presents with  . Weakness  . Nausea     (Consider location/radiation/quality/duration/timing/severity/associated sxs/prior Treatment) HPI Comments: Patient is a 49 year old female with history of type 2 diabetes. She presents for evaluation of a two-week history of feeling weak. She started yesterday with nausea, vomiting, and diarrhea. She reports seeing a little blood in her vomit, but denies bloody stools. She reports generalized abdominal cramping but no specific localized painful area. She does report some discomfort in her low back and has had UTIs in the past that have felt this way. She tells me that her sugars have been running in the upper 100s, lower 200s. This morning she developed blurry vision in both eyes.  Patient is a 49 y.o. female presenting with weakness. The history is provided by the patient.  Weakness This is a new problem. Episode onset: 2 weeks ago. The problem occurs constantly. The problem has been gradually worsening. Pertinent negatives include no chest pain, no abdominal pain and no shortness of breath. Nothing aggravates the symptoms. Nothing relieves the symptoms. She has tried nothing for the symptoms. The treatment provided no relief.    Past Medical History  Diagnosis Date  . Diabetes mellitus   . Asthma   . Ankle fracture, right    Past Surgical History  Procedure Laterality Date  . Hammer toe surgery    . Lipoma excision    . Knee arthrocentesis    . Foot fasciotomy    . Endometrial ablation w/ novasure    . Back surgery    . Lipoma removed     Family History  Problem Relation Age of Onset  . Stroke Mother   . Diabetes Mother   . Hypertension Mother   . Stroke Father   . Sudden death Father   . Hypertension Father   . Hyperlipidemia Father   . Heart attack Father   . Diabetes Father   .  Diabetes Maternal Grandmother   . Heart attack Maternal Grandmother   . Hypertension Maternal Grandmother   . Diabetes Paternal Grandmother   . Sudden death Paternal Grandmother    Social History  Substance Use Topics  . Smoking status: Current Every Day Smoker  . Smokeless tobacco: Never Used  . Alcohol Use: No   OB History    No data available     Review of Systems  Respiratory: Negative for shortness of breath.   Cardiovascular: Negative for chest pain.  Gastrointestinal: Negative for abdominal pain.  Neurological: Positive for weakness.  All other systems reviewed and are negative.     Allergies  Review of patient's allergies indicates no known allergies.  Home Medications   Prior to Admission medications   Medication Sig Start Date End Date Taking? Authorizing Provider  Ascorbic Acid (VITAMIN C) 100 MG tablet Take 100 mg by mouth daily.   Yes Historical Provider, MD  atorvastatin (LIPITOR) 10 MG tablet Take 5 mg by mouth every other day.    Yes Historical Provider, MD  cholecalciferol (VITAMIN D) 1000 UNITS tablet Take 1,000 Units by mouth daily.   Yes Historical Provider, MD  Cranberry 400 MG TABS Take 400 mg by mouth daily.   Yes Historical Provider, MD  estrogen, conjugated,-medroxyprogesterone (PREMPRO) 0.3-1.5 MG tablet Take 1 tablet by mouth daily. 12/03/14  Yes Historical Provider, MD  [place]  1000 MG CAPS Take 1,000 mg by mouth daily.   Yes Historical Provider, MD  Liraglutide (VICTOZA [place]) Inject into the skin.   Yes Historical Provider, MD  metFORMIN (GLUCOPHAGE) 1000 MG tablet Take 1,000 mg by mouth daily with breakfast.    Yes Historical Provider, MD  metFORMIN (GLUCOPHAGE) 1000 MG tablet Take 1 tablet (1,000 mg total) by mouth 2 (two) times daily. Patient taking differently: Take 1,000 mg by mouth daily.  09/22/12  Yes [name], MD  metroNIDAZOLE (FLAGYL) 500 MG tablet Take 1 tablet (500 mg total) by mouth 2 (two) times daily. 12/12/14  Yes [place] N  Rumley, DO  Multiple Vitamin (MULTIVITAMIN) capsule Take 1 capsule by mouth daily.   Yes Historical Provider, MD   BP 145/89 mmHg  Pulse 74  Temp(Src) 98.2 F (36.8 C) (Oral)  Resp 18  Ht 5\' 2"  (1.575 m)  Wt 189 lb (85.73 kg)  BMI 34.56 kg/m2  SpO2 100% Physical Exam  Constitutional: She is oriented to person, place, and time. She appears well-developed and well-nourished. No distress.  HENT:  Head: Normocephalic and atraumatic.  Mouth/Throat: Oropharynx is clear and moist.  Eyes: EOM are normal. Pupils are equal, round, and reactive to light.  Neck: Normal range of motion. Neck supple.  Cardiovascular: Normal rate and regular rhythm.  Exam reveals no gallop and no friction rub.   No murmur heard. Pulmonary/Chest: Effort normal and breath sounds normal. No respiratory distress. She has no wheezes. She has no rales.  Abdominal: Soft. Bowel sounds are normal. She exhibits no distension. There is no tenderness.  Musculoskeletal: Normal range of motion. She exhibits no edema.  Lymphadenopathy:    She has no cervical adenopathy.  Neurological: She is alert and oriented to person, place, and time. No cranial nerve deficit. She exhibits normal muscle tone. Coordination normal.  Skin: Skin is warm and dry. She is not diaphoretic.  Nursing note and vitals reviewed.   ED Course  Procedures (including critical care time) Labs Review Labs Reviewed  COMPREHENSIVE METABOLIC PANEL  LIPASE, BLOOD  CBC WITH DIFFERENTIAL/PLATELET  URINALYSIS, ROUTINE W REFLEX MICROSCOPIC (NOT AT [facility])    Imaging Review No results found. I have personally reviewed and evaluated these images and lab results as part of my medical decision-making.   EKG Interpretation None      MDM   Final diagnoses:  None    Patient is a 49 year old female who comes in with complaints of low back pain, nausea, vomiting, diarrhea, and an episode of blurry vision that occurred this morning. Her laboratory studies and  CT scan are all unremarkable. Her urinalysis is clear of infection. I suspect she has some sort of a viral gastroenteritis as she is now feeling better with fluids, anti-medics, and Toradol. Her neurologic exam is nonfocal. She will be given Zofran and discharged to home with when necessary return.    [name], MD 03/14/15 [phone]

## 2015-06-14 ENCOUNTER — Emergency Department (HOSPITAL_BASED_OUTPATIENT_CLINIC_OR_DEPARTMENT_OTHER)
Admission: EM | Admit: 2015-06-14 | Discharge: 2015-06-14 | Disposition: A | Discharge status: Home / Self Care | Payer: BLUE CROSS/BLUE SHIELD | Attending: Emergency Medicine | Admitting: Emergency Medicine

## 2015-06-14 ENCOUNTER — Encounter (HOSPITAL_BASED_OUTPATIENT_CLINIC_OR_DEPARTMENT_OTHER): Payer: Self-pay | Admitting: *Deleted

## 2015-06-14 ENCOUNTER — Emergency Department (HOSPITAL_BASED_OUTPATIENT_CLINIC_OR_DEPARTMENT_OTHER): Payer: BLUE CROSS/BLUE SHIELD

## 2015-06-14 DIAGNOSIS — E119 Type 2 diabetes mellitus without complications: Secondary | ICD-10-CM | POA: Diagnosis not present

## 2015-06-14 DIAGNOSIS — J45901 Unspecified asthma with (acute) exacerbation: Secondary | ICD-10-CM | POA: Insufficient documentation

## 2015-06-14 DIAGNOSIS — Z7984 Long term (current) use of oral hypoglycemic drugs: Secondary | ICD-10-CM | POA: Insufficient documentation

## 2015-06-14 DIAGNOSIS — R509 Fever, unspecified: Secondary | ICD-10-CM | POA: Diagnosis present

## 2015-06-14 DIAGNOSIS — Z79899 Other long term (current) drug therapy: Secondary | ICD-10-CM | POA: Diagnosis not present

## 2015-06-14 DIAGNOSIS — B349 Viral infection, unspecified: Secondary | ICD-10-CM | POA: Diagnosis not present

## 2015-06-14 DIAGNOSIS — Z8781 Personal history of (healed) traumatic fracture: Secondary | ICD-10-CM | POA: Insufficient documentation

## 2015-06-14 DIAGNOSIS — F17200 Nicotine dependence, unspecified, uncomplicated: Secondary | ICD-10-CM | POA: Insufficient documentation

## 2015-06-14 DIAGNOSIS — R51 Headache: Secondary | ICD-10-CM | POA: Diagnosis not present

## 2015-06-14 MED ORDER — IPRATROPIUM-ALBUTEROL 0.5-2.5 (3) MG/3ML IN SOLN
3.0000 mL | RESPIRATORY_TRACT | Status: DC
  Administered 2015-06-14: 3 mL via RESPIRATORY_TRACT
  Filled 2015-06-14: qty 3

## 2015-06-14 MED ORDER — KETOROLAC TROMETHAMINE 30 MG/ML IJ SOLN
30.0000 mg | Freq: Once | INTRAMUSCULAR | Status: AC
  Administered 2015-06-14: 30 mg via INTRAMUSCULAR
  Filled 2015-06-14: qty 1

## 2015-06-14 MED ORDER — SODIUM CHLORIDE 0.9 % IV BOLUS (SEPSIS)
500.0000 mL | Freq: Once | INTRAVENOUS | Status: AC
  Administered 2015-06-14: 500 mL via INTRAVENOUS

## 2015-06-14 MED ORDER — HYDROCOD POLST-CPM POLST ER 10-8 MG/5ML PO SUER: 5.0000 mL | Freq: Every evening | ORAL | Status: DC | PRN

## 2015-06-14 MED ORDER — ACETAMINOPHEN 500 MG PO TABS
1000.0000 mg | ORAL_TABLET | Freq: Once | ORAL | Status: DC
  Filled 2015-06-14: qty 2

## 2015-06-14 MED ORDER — IBUPROFEN 800 MG PO TABS
800.0000 mg | ORAL_TABLET | Freq: Once | ORAL | Status: AC
  Administered 2015-06-14: 800 mg via ORAL
  Filled 2015-06-14: qty 1

## 2015-06-14 MED ORDER — BENZONATATE 100 MG PO CAPS: 100.0000 mg | ORAL_CAPSULE | Freq: Three times a day (TID) | ORAL | Status: DC

## 2015-06-14 MED ORDER — ALBUTEROL SULFATE HFA 108 (90 BASE) MCG/ACT IN AERS
2.0000 | INHALATION_SPRAY | Freq: Once | RESPIRATORY_TRACT | Status: AC
  Administered 2015-06-14: 2 via RESPIRATORY_TRACT
  Filled 2015-06-14: qty 6.7

## 2015-06-14 NOTE — ED Provider Notes (Signed)
CSN: [id number]     Arrival date & time 06/14/15  1215 History   First MD Initiated Contact with Patient 06/14/15 1446     Chief Complaint  Patient presents with  . Fever     (Consider location/radiation/quality/duration/timing/severity/associated sxs/prior Treatment) HPI Karen Patrick is a 50 y.o. female history of asthma, diabetes, comes in for evaluation of fever, chills, bodyaches, cough, nausea and vomiting, diarrhea, runny nose and headache since Friday. Patient reports her grandson is also here for similar symptoms. She has not tried anything to improve her symptoms. She has not been to her PCP for this problem. Reports 4 loose stools and "multiple episodes of vomiting", nonbloody and nonbilious. Denies any other overt abdominal pain, dark or bloody stool, urinary symptoms. No other alleviating or aggravating factors.  Past Medical History  Diagnosis Date  . Diabetes mellitus   . Asthma   . Ankle fracture, right    Past Surgical History  Procedure Laterality Date  . Hammer toe surgery    . Lipoma excision    . Knee arthrocentesis    . Foot fasciotomy    . Endometrial ablation w/ novasure    . Back surgery    . Lipoma removed     Family History  Problem Relation Age of Onset  . Stroke Mother   . Diabetes Mother   . Hypertension Mother   . Stroke Father   . Sudden death Father   . Hypertension Father   . Hyperlipidemia Father   . Heart attack Father   . Diabetes Father   . Diabetes Maternal Grandmother   . Heart attack Maternal Grandmother   . Hypertension Maternal Grandmother   . Diabetes Paternal Grandmother   . Sudden death Paternal Grandmother    Social History  Substance Use Topics  . Smoking status: Current Every Day Smoker  . Smokeless tobacco: Never Used  . Alcohol Use: No   OB History    No data available     Review of Systems A 10 point review of systems was completed and was negative except for pertinent positives and negatives as mentioned in the  history of present illness     Allergies  Review of patient's allergies indicates no known allergies.  Home Medications   Prior to Admission medications   Medication Sig Start Date End Date Taking? Authorizing Provider  Ascorbic Acid (VITAMIN C) 100 MG tablet Take 100 mg by mouth daily.    Historical Provider, MD  atorvastatin (LIPITOR) 10 MG tablet Take 5 mg by mouth every other day.     Historical Provider, MD  benzonatate (TESSALON) 100 MG capsule Take 1 capsule (100 mg total) by mouth every 8 (eight) hours. 06/14/15   [name], PA-C  chlorpheniramine-HYDROcodone (TUSSIONEX PENNKINETIC ER) 10-8 MG/5ML SUER Take 5 mLs by mouth at bedtime as needed for cough. 06/14/15   [name], PA-C  cholecalciferol (VITAMIN D) 1000 UNITS tablet Take 1,000 Units by mouth daily.    Historical Provider, MD  Cranberry 400 MG TABS Take 400 mg by mouth daily.    Historical Provider, MD  estrogen, conjugated,-medroxyprogesterone (PREMPRO) 0.3-1.5 MG tablet Take 1 tablet by mouth daily. 12/03/14   Historical Provider, MD  Boris Lown Oil 1000 MG CAPS Take 1,000 mg by mouth daily.    Historical Provider, MD  Liraglutide (VICTOZA [place]) Inject into the skin.    Historical Provider, MD  metFORMIN (GLUCOPHAGE) 1000 MG tablet Take 1,000 mg by mouth daily with breakfast.     Historical  Provider, MD  metFORMIN (GLUCOPHAGE) 1000 MG tablet Take 1 tablet (1,000 mg total) by mouth 2 (two) times daily. Patient taking differently: Take 1,000 mg by mouth daily.  09/22/12   [name], MD  metroNIDAZOLE (FLAGYL) 500 MG tablet Take 1 tablet (500 mg total) by mouth 2 (two) times daily. 12/12/14   [place] N Rumley, DO  Multiple Vitamin (MULTIVITAMIN) capsule Take 1 capsule by mouth daily.    Historical Provider, MD  ondansetron (ZOFRAN ODT) 8 MG disintegrating tablet  ODT q4 hours prn nausea 03/14/15   [name], MD   BP 124/84 mmHg  Pulse 103  Temp(Src) 101.3 F (38.5 C) (Oral)  Resp 18  Ht 5' 2.5" (1.588 m)  Wt  85.73 kg  BMI 34.00 kg/m2  SpO2 96% Physical Exam  Constitutional: She is oriented to person, place, and time. She appears well-developed and well-nourished.  HENT:  Head: Normocephalic and atraumatic.  Mouth/Throat: Oropharynx is clear and moist.  Eyes: Conjunctivae are normal. Pupils are equal, round, and reactive to light. Right eye exhibits no discharge. Left eye exhibits no discharge. No scleral icterus.  Neck: Neck supple.  Cardiovascular: Normal rate, regular rhythm and normal heart sounds.   Pulmonary/Chest: Effort normal. No respiratory distress. She has wheezes. She has no rales.  Mild diffuse wheezing, worse on right side and at lower right lung base. No other adventitious lung sounds appreciated. No evidence of respiratory distress. Effort is normal. Oxygen saturation is 100% on room air  Abdominal: Soft. There is no tenderness.  Musculoskeletal: Normal range of motion. She exhibits no edema or tenderness.  Neurological: She is alert and oriented to person, place, and time.  Cranial Nerves II-XII grossly intact  Skin: Skin is warm and dry. No rash noted.  Psychiatric: She has a normal mood and affect.  Nursing note and vitals reviewed.   ED Course  Procedures (including critical care time) Labs Review Labs Reviewed - No data to display  Imaging Review Dg Chest 2 View  06/14/2015  CLINICAL DATA:  Cough, fever, chills since yesterday EXAM: CHEST  2 VIEW COMPARISON:  Patient's prior chest x-ray of January 25, 2012 is not available on PACs for comparison. FINDINGS: The heart size and mediastinal contours are within normal limits. The aorta is tortuous. There is no focal infiltrate, pulmonary edema, or pleural effusion. The visualized skeletal structures are unremarkable. IMPRESSION: No active cardiopulmonary disease. Electronically Signed   By: Sherian Rein M.D.   On: 06/14/2015 15:25   I have personally reviewed and evaluated these images and lab results as part of my  medical decision-making.   EKG Interpretation None     Meds given in ED:  Medications  ipratropium-albuterol (DUONEB) 0.5-2.5 (3) MG/3ML nebulizer solution 3 mL (3 mLs Nebulization Given 06/14/15 1518)  ibuprofen (ADVIL,MOTRIN) tablet 800 mg (800 mg Oral Given 06/14/15 1458)  ketorolac (TORADOL) 30 MG/ML injection 30 mg (30 mg Intramuscular Given 06/14/15 1541)  sodium chloride 0.9 % bolus 500 mL (500 mLs Intravenous New Bag/Given 06/14/15 1549)    New Prescriptions   BENZONATATE (TESSALON) 100 MG CAPSULE    Take 1 capsule (100 mg total) by mouth every 8 (eight) hours.   CHLORPHENIRAMINE-HYDROCODONE (TUSSIONEX PENNKINETIC ER) 10-8 MG/5ML SUER    Take 5 mLs by mouth at bedtime as needed for cough.   Filed Vitals:   06/14/15 1237 06/14/15 1449 06/14/15 1520 06/14/15 1610  BP: 134/86 153/92  124/84  Pulse: 107 103  103  Temp: 98.9 F (37.2 C)  102.9 F (39.4 C)  101.3 F (38.5 C)  TempSrc: Oral Oral  Oral  Resp: 20 20  18   Height: 5' 2.5" (1.588 m)     Weight: 85.73 kg     SpO2: 100% 100% 100% 96%    MDM  Patient with symptoms consistent with influenza.  Vitals are stable, low-grade fever.  No signs of dehydration, tolerating PO's. No diarrhea or vomiting in the ED. Mild wheezing on right side improved after DuoNeb. Due to patient's presentation and physical exam a chest x-ray was ordered to rule out pneumonia.  Discussed the cost versus benefit of Tamiflu treatment with the patient.  The patient understands that symptoms are greater than the recommended 24-48 hour window of treatment. Does state she feels much better after administration of Toradol and IV fluids in the ED. Patient will be discharged with instructions to orally hydrate, rest, and use over-the-counter medications such as anti-inflammatories ibuprofen and Aleve for muscle aches and Tylenol for fever.  Patient will also be given albuterol inhaler and a cough suppressant. Discussed follow-up with PCP this week for  reevaluation. Discussed strict return precautions. She verbalizes understanding and agrees with this plan.  Final diagnoses:  Viral illness        [name], PA-C 06/14/15 1618  Nelva Nay, MD 06/22/15 [phone]

## 2015-06-14 NOTE — ED Notes (Signed)
Pt given d/c instructions as per chart. Verbalizes understanding. No questions. Rx x 2. 

## 2015-06-14 NOTE — ED Notes (Signed)
MDI given to patient to take home. Patient has taken in the past and had no questions.

## 2015-06-14 NOTE — ED Notes (Signed)
Fever, chills, body aches, cough, diarrhea, runny nose and headache x 2 days.

## 2015-06-14 NOTE — Discharge Instructions (Signed)
Please take your medications as prescribed. Please be sure to drink plenty of clear fluids such as water, Gatorade or other electrolyte balanced solution. You will need to drink at least 8, 8 ounce glasses per day. Follow-up with your doctor in the next 3 days for reevaluation. Return to ED for new or worsening symptoms as we discussed.

## 2015-09-01 ENCOUNTER — Encounter (HOSPITAL_BASED_OUTPATIENT_CLINIC_OR_DEPARTMENT_OTHER): Payer: Self-pay | Admitting: *Deleted

## 2015-09-01 ENCOUNTER — Emergency Department (HOSPITAL_BASED_OUTPATIENT_CLINIC_OR_DEPARTMENT_OTHER)
Admission: EM | Admit: 2015-09-01 | Discharge: 2015-09-01 | Disposition: A | Discharge status: Home / Self Care | Payer: BLUE CROSS/BLUE SHIELD | Attending: Emergency Medicine | Admitting: Emergency Medicine

## 2015-09-01 DIAGNOSIS — F17200 Nicotine dependence, unspecified, uncomplicated: Secondary | ICD-10-CM | POA: Insufficient documentation

## 2015-09-01 DIAGNOSIS — Z7984 Long term (current) use of oral hypoglycemic drugs: Secondary | ICD-10-CM | POA: Insufficient documentation

## 2015-09-01 DIAGNOSIS — J45909 Unspecified asthma, uncomplicated: Secondary | ICD-10-CM | POA: Diagnosis not present

## 2015-09-01 DIAGNOSIS — E119 Type 2 diabetes mellitus without complications: Secondary | ICD-10-CM | POA: Insufficient documentation

## 2015-09-01 DIAGNOSIS — R197 Diarrhea, unspecified: Secondary | ICD-10-CM | POA: Insufficient documentation

## 2015-09-01 DIAGNOSIS — N12 Tubulo-interstitial nephritis, not specified as acute or chronic: Secondary | ICD-10-CM | POA: Diagnosis not present

## 2015-09-01 DIAGNOSIS — R509 Fever, unspecified: Secondary | ICD-10-CM | POA: Diagnosis present

## 2015-09-01 LAB — CBC WITH DIFFERENTIAL/PLATELET
Basophils Absolute: 0 10*3/uL (ref 0.0–0.1)
Basophils Relative: 0 %
Eosinophils Absolute: 0 10*3/uL (ref 0.0–0.7)
Eosinophils Relative: 0 %
HEMATOCRIT: 40.9 % (ref 36.0–46.0)
Hemoglobin: 13.5 g/dL (ref 12.0–15.0)
LYMPHS PCT: 14 %
Lymphs Abs: 1.3 10*3/uL (ref 0.7–4.0)
MCH: 29.9 pg (ref 26.0–34.0)
MCHC: 33 g/dL (ref 30.0–36.0)
MCV: 90.5 fL (ref 78.0–100.0)
MONO ABS: 0.2 10*3/uL (ref 0.1–1.0)
MONOS PCT: 2 %
NEUTROS ABS: 7.4 10*3/uL (ref 1.7–7.7)
Neutrophils Relative %: 84 %
Platelets: 180 10*3/uL (ref 150–400)
RBC: 4.52 MIL/uL (ref 3.87–5.11)
RDW: 13 % (ref 11.5–15.5)
WBC: 8.9 10*3/uL (ref 4.0–10.5)

## 2015-09-01 LAB — URINE MICROSCOPIC-ADD ON: RBC / HPF: NONE SEEN RBC/hpf (ref 0–5)

## 2015-09-01 LAB — COMPREHENSIVE METABOLIC PANEL
ALT: 17 U/L (ref 14–54)
ANION GAP: 6 (ref 5–15)
AST: 18 U/L (ref 15–41)
Albumin: 4.1 g/dL (ref 3.5–5.0)
Alkaline Phosphatase: 81 U/L (ref 38–126)
BILIRUBIN TOTAL: 0.6 mg/dL (ref 0.3–1.2)
BUN: 16 mg/dL (ref 6–20)
CALCIUM: 8.7 mg/dL — AB (ref 8.9–10.3)
CO2: 22 mmol/L (ref 22–32)
CREATININE: 0.68 mg/dL (ref 0.44–1.00)
Chloride: 110 mmol/L (ref 101–111)
GFR calc Af Amer: >60 mL/min (ref >60)
GLUCOSE: 114 mg/dL — AB (ref 65–99)
POTASSIUM: 3.7 mmol/L (ref 3.5–5.1)
Sodium: 138 mmol/L (ref 135–145)
TOTAL PROTEIN: 7.4 g/dL (ref 6.5–8.1)

## 2015-09-01 LAB — URINALYSIS, ROUTINE W REFLEX MICROSCOPIC
Bilirubin Urine: NEGATIVE
GLUCOSE, UA: NEGATIVE mg/dL
Hgb urine dipstick: NEGATIVE
Ketones, ur: NEGATIVE mg/dL
LEUKOCYTES UA: NEGATIVE
Nitrite: POSITIVE — AB
PH: 5.5 (ref 5.0–8.0)
Protein, ur: NEGATIVE mg/dL
Specific Gravity, Urine: 1.014 (ref 1.005–1.030)

## 2015-09-01 LAB — I-STAT CG4 LACTIC ACID, ED: Lactic Acid, Venous: 0.96 mmol/L (ref 0.5–2.0)

## 2015-09-01 MED ORDER — KETOROLAC TROMETHAMINE 30 MG/ML IJ SOLN
30.0000 mg | Freq: Once | INTRAMUSCULAR | Status: AC
Start: 2015-09-01 — End: 2015-09-01
  Administered 2015-09-01: 30 mg via INTRAVENOUS
  Filled 2015-09-01: qty 1

## 2015-09-01 MED ORDER — CEPHALEXIN 250 MG PO CAPS
500.0000 mg | ORAL_CAPSULE | Freq: Once | ORAL | Status: AC
  Administered 2015-09-01: 500 mg via ORAL
  Filled 2015-09-01: qty 2

## 2015-09-01 MED ORDER — IBUPROFEN 100 MG/5ML PO SUSP: 600.0000 mg | Freq: Once | ORAL | Status: DC

## 2015-09-01 MED ORDER — SODIUM CHLORIDE 0.9 % IV BOLUS (SEPSIS)
1000.0000 mL | Freq: Once | INTRAVENOUS | Status: AC
  Administered 2015-09-01: 1000 mL via INTRAVENOUS

## 2015-09-01 MED ORDER — CEPHALEXIN 500 MG PO CAPS: 500.0000 mg | ORAL_CAPSULE | Freq: Three times a day (TID) | ORAL | Status: DC

## 2015-09-01 NOTE — ED Notes (Signed)
Fever onset at 1700 this am, w diarrhea, lower back pain, denies ua sx

## 2015-09-01 NOTE — ED Provider Notes (Signed)
CSN: [id number]     Arrival date & time 09/01/15  1910 History  By signing my name below, I, Karen Patrick, attest that this documentation has been prepared under the direction and in the presence of [name], MD. Electronically Signed: Octavia Patrick, ED Scribe. 09/01/2015. 7:28 PM.     Chief Complaint  Patient presents with  . Fever      The history is provided by the patient. No language interpreter was used.   HPI Comments: Karen Patrick is a 50 y.o. female who has a PMHx of DM and asthma presents to the Emergency Department complaining of sudden onset, gradual worsening, moderate, cold-like symptoms onset yesterday. She reports having associated fever, chills, loss of appetite, generalized body aches, diarrhea, and left flank pain. Pt notes she was out walking her dog when both of her arms began to feel tingling and shakey. She notes she shortly began to feel very hot and started having diarrhea. She states she took some tylenol to alleviate her symptoms with relief. Pt has been drinking electrolyte water and notes that she has not been eating much solids. Pt notes her urine is more cloudy than normal with dysuria.  Denies nausea, vomiting, abdominal pain, recent travel or antibiotics, cough, congestion, sore throat, or rhinorrhea.  Past Medical History  Diagnosis Date  . Diabetes mellitus   . Asthma   . Ankle fracture, right    Past Surgical History  Procedure Laterality Date  . Hammer toe surgery    . Lipoma excision    . Knee arthrocentesis    . Foot fasciotomy    . Endometrial ablation w/ novasure    . Back surgery    . Lipoma removed     Family History  Problem Relation Age of Onset  . Stroke Mother   . Diabetes Mother   . Hypertension Mother   . Stroke Father   . Sudden death Father   . Hypertension Father   . Hyperlipidemia Father   . Heart attack Father   . Diabetes Father   . Diabetes Maternal Grandmother   . Heart attack Maternal Grandmother   .  Hypertension Maternal Grandmother   . Diabetes Paternal Grandmother   . Sudden death Paternal Grandmother    Social History  Substance Use Topics  . Smoking status: Current Every Day Smoker  . Smokeless tobacco: Never Used  . Alcohol Use: No   OB History    No data available     Review of Systems  Constitutional: Positive for fever, chills and appetite change.  HENT: Negative for congestion, sinus pressure and sore throat.   Gastrointestinal: Positive for diarrhea. Negative for nausea, vomiting and abdominal pain.  Genitourinary: Positive for flank pain (left).  All other systems reviewed and are negative.     Allergies  Review of patient's allergies indicates no known allergies.  Home Medications   Prior to Admission medications   Medication Sig Start Date End Date Taking? Authorizing Provider  Ascorbic Acid (VITAMIN C) 100 MG tablet Take 100 mg by mouth daily.    Historical Provider, MD  atorvastatin (LIPITOR) 10 MG tablet Take 5 mg by mouth every other day.     Historical Provider, MD  benzonatate (TESSALON) 100 MG capsule Take 1 capsule (100 mg total) by mouth every 8 (eight) hours. 06/14/15   [name], PA-C  cephALEXin (KEFLEX) 500 MG capsule Take 1 capsule (500 mg total) by mouth 3 (three) times daily. 09/01/15   [name],  MD  chlorpheniramine-HYDROcodone (TUSSIONEX PENNKINETIC ER) 10-8 MG/5ML SUER Take 5 mLs by mouth at bedtime as needed for cough. 06/14/15   [name], PA-C  cholecalciferol (VITAMIN D) 1000 UNITS tablet Take 1,000 Units by mouth daily.    Historical Provider, MD  Cranberry 400 MG TABS Take 400 mg by mouth daily.    Historical Provider, MD  estrogen, conjugated,-medroxyprogesterone (PREMPRO) 0.3-1.5 MG tablet Take 1 tablet by mouth daily. 12/03/14   Historical Provider, MD  Boris Lown Oil 1000 MG CAPS Take 1,000 mg by mouth daily.    Historical Provider, MD  Liraglutide (VICTOZA [place]) Inject into the skin.    Historical Provider, MD  metFORMIN  (GLUCOPHAGE) 1000 MG tablet Take 1,000 mg by mouth daily with breakfast.     Historical Provider, MD  metFORMIN (GLUCOPHAGE) 1000 MG tablet Take 1 tablet (1,000 mg total) by mouth 2 (two) times daily. Patient taking differently: Take 1,000 mg by mouth daily.  09/22/12   [name], MD  metroNIDAZOLE (FLAGYL) 500 MG tablet Take 1 tablet (500 mg total) by mouth 2 (two) times daily. 12/12/14   [name], DO  Multiple Vitamin (MULTIVITAMIN) capsule Take 1 capsule by mouth daily.    Historical Provider, MD  ondansetron (ZOFRAN ODT) 8 MG disintegrating tablet 8mg  ODT q4 hours prn nausea 03/14/15   [name], MD   Triage vitals: BP 162/94 mmHg  Pulse 99  Temp(Src) 100.2 F (37.9 C) (Oral)  Resp 18  Ht 5\' 3"  (1.6 m)  Wt 193 lb (87.544 kg)  BMI 34.20 kg/m2  SpO2 100% Physical Exam Physical Exam  Nursing note and vitals reviewed. Constitutional: Well developed, well nourished, non-toxic, and in no acute distress Head: Normocephalic and atraumatic.  Mouth/Throat: Oropharynx is clear and moist.  Neck: Normal range of motion. Neck supple.  Cardiovascular: Normal rate and regular rhythm.   Pulmonary/Chest: Effort normal and breath sounds normal.  Abdominal: Soft. There is left CVA tenderness. There is no rebound and no guarding.  Musculoskeletal: Normal range of motion.  Neurological: Alert, no facial droop, fluent speech, moves all extremities symmetrically Skin: Skin is warm and dry.  Psychiatric: Cooperative  ED Course  Procedures  DIAGNOSTIC STUDIES: Oxygen Saturation is 100% on RA, normal by my interpretation.  COORDINATION OF CARE:  7:26 PM Discussed treatment plan which includes lab work with pt at bedside and pt agreed to plan.  Labs Review Labs Reviewed  URINALYSIS, ROUTINE W REFLEX MICROSCOPIC (NOT AT [facility]) - Abnormal; Notable for the following:    Nitrite POSITIVE (*)    All other components within normal limits  COMPREHENSIVE METABOLIC PANEL - Abnormal; Notable for  the following:    Glucose, Bld 114 (*)    Calcium 8.7 (*)    All other components within normal limits  URINE MICROSCOPIC-ADD ON - Abnormal; Notable for the following:    Squamous Epithelial / LPF 0-5 (*)    Bacteria, UA MANY (*)    All other components within normal limits  URINE CULTURE  CBC WITH DIFFERENTIAL/PLATELET  I-STAT CG4 LACTIC ACID, ED  I-STAT CG4 LACTIC ACID, ED    Imaging Review No results found. I have personally reviewed and evaluated these images and lab results as part of my medical decision-making.   EKG Interpretation None      MDM   Final diagnoses:  Pyelonephritis    50 year old female who presents with left flank pain, fever, diarrhea and urinary complaints. She on presentation is nontoxic and in no acute distress. She  does have fever here to 101 Fahrenheit initial tachycardia. Is normotensive in no respiratory distress. Her abdomen is soft and benign. She does have left CVA tenderness, and I suspect pyelonephritis in the setting of her symptoms. Cardiopulmonary exam is unremarkable. She has a normal lactate, no leukocytosis, or any other signs of severe electrolyte or metabolic derangements. Normal renal function. UA is nitrite positive with many bacteria and is sent for urine culture. We'll treat for pyelonephritis with a course of Keflex. Given Toradol and IV fluids for symptomatic control, to good effect. Her fever resolved and her tachycardia resolved after treatment. At this time, she is also tolerating by mouth. She is appropriate for outpatient management of her pyelonephritis. Strict return and follow-up instructions are reviewed. She expressed understanding of all discharge instructions, and felt comfortable to plan of care  I personally performed the services described in this documentation, which was scribed in my presence. The recorded information has been reviewed and is accurate.     [name], MD 09/01/15 [phone]

## 2015-09-01 NOTE — Discharge Instructions (Signed)
Please take antibiotics as prescribed. Return without fail for worsening symptoms, including persistent fevers despite antibiotics, vomiting unable to keep down food or fluids, worsening pain, or any other symptoms concerning to you.  Pyelonephritis, Adult Pyelonephritis is a kidney infection. The kidneys are the organs that filter a person's blood and move waste out of the bloodstream and into the urine. Urine passes from the kidneys, through the ureters, and into the bladder. There are two main types of pyelonephritis:  Infections that come on quickly without any warning (acute pyelonephritis).  Infections that last for a long period of time (chronic pyelonephritis). In most cases, the infection clears up with treatment and does not cause further problems. More severe infections or chronic infections can sometimes spread to the bloodstream or lead to other problems with the kidneys. CAUSES This condition is usually caused by:  Bacteria traveling from the bladder to the kidney through infected urine. The urine in the bladder can become infected with bacteria from:  Bladder infection (cystitis).  Inflammation of the prostate gland (prostatitis).  Sexual intercourse, in females.  Bacteria traveling from the bloodstream to the kidney. RISK FACTORS This condition is more likely to develop in:  Pregnant women.  Older people.  People who have diabetes.  People who have kidney stones or bladder stones.  People who have other abnormalities of the kidney or ureter.  People who have a catheter placed in the bladder.  People who have cancer.  People who are sexually active.  Women who use spermicides.  People who have had a prior urinary tract infection. SYMPTOMS Symptoms of this condition include:  Frequent urination.  Strong or persistent urge to urinate.  Burning or stinging when urinating.  Abdominal pain.  Back pain.  Pain in the side or flank  area.  Fever.  Chills.  Blood in the urine, or dark urine.  Nausea.  Vomiting. DIAGNOSIS This condition may be diagnosed based on:  Medical history and physical exam.  Urine tests.  Blood tests. You may also have imaging tests of the kidneys, such as an ultrasound or CT scan. TREATMENT Treatment for this condition may depend on the severity of the infection.  If the infection is mild and is found early, you may be treated with antibiotic medicines taken by mouth. You will need to drink fluids to remain hydrated.  If the infection is more severe, you may need to stay in the hospital and receive antibiotics given directly into a vein through an IV tube. You may also need to receive fluids through an IV tube if you are not able to remain hydrated. After your hospital stay, you may need to take oral antibiotics for a period of time. Other treatments may be required, depending on the cause of the infection. HOME CARE INSTRUCTIONS Medicines  Take over-the-counter and prescription medicines only as told by your health care provider.  If you were prescribed an antibiotic medicine, take it as told by your health care provider. Do not stop taking the antibiotic even if you start to feel better. General Instructions  Drink enough fluid to keep your urine clear or pale yellow.  Avoid caffeine, tea, and carbonated beverages. They tend to irritate the bladder.  Urinate often. Avoid holding in urine for long periods of time.  Urinate before and after sex.  After a bowel movement, women should cleanse from front to back. Use each tissue only once.  Keep all follow-up visits as told by your health care provider. This is  important. SEEK MEDICAL CARE IF:  Your symptoms do not get better after 2 days of treatment.  Your symptoms get worse.  You have a fever. SEEK IMMEDIATE MEDICAL CARE IF:  You are unable to take your antibiotics or fluids.  You have shaking chills.  You  vomit.  You have severe flank or back pain.  You have extreme weakness or fainting.   This information is not intended to replace advice given to you by your health care provider. Make sure you discuss any questions you have with your health care provider.   Document Released: 04/16/2005 Document Revised: 01/05/2015 Document Reviewed: 08/09/2014 Elsevier Interactive Patient Education [employer]

## 2015-09-01 NOTE — ED Notes (Signed)
Diarrhea yesterday. Chills, fever. Back pain.

## 2015-09-04 LAB — URINE CULTURE: Culture: >=100000 — AB

## 2015-09-05 ENCOUNTER — Telehealth (HOSPITAL_BASED_OUTPATIENT_CLINIC_OR_DEPARTMENT_OTHER): Payer: Self-pay | Admitting: Emergency Medicine

## 2015-09-05 NOTE — Telephone Encounter (Signed)
Post ED Visit - Positive Culture Follow-up  Culture report reviewed by antimicrobial stewardship pharmacist:  []  Enzo Bi, Pharm.D. []  Celedonio Miyamoto, Pharm.D., BCPS []  Garvin Fila, Pharm.D. []  Georgina Pillion, Pharm.D., BCPS []  [place], [place].D., BCPS, AAHIVP []  Estella Husk, Pharm.D., BCPS, AAHIVP [x]  Tennis Must, Pharm.D. []  Sherle Poe, [place].D.  Positive urine culture Treated with cephalexin,  organism sensitive to the same and no further patient follow-up is required at this time.  Berle Mull 09/05/2015, 10:55 AM

## 2015-09-14 ENCOUNTER — Encounter (HOSPITAL_BASED_OUTPATIENT_CLINIC_OR_DEPARTMENT_OTHER): Payer: Self-pay | Admitting: Emergency Medicine

## 2015-09-14 ENCOUNTER — Emergency Department (HOSPITAL_BASED_OUTPATIENT_CLINIC_OR_DEPARTMENT_OTHER)
Admission: EM | Admit: 2015-09-14 | Discharge: 2015-09-14 | Disposition: A | Discharge status: Home / Self Care | Payer: BLUE CROSS/BLUE SHIELD | Attending: Emergency Medicine | Admitting: Emergency Medicine

## 2015-09-14 ENCOUNTER — Emergency Department (HOSPITAL_BASED_OUTPATIENT_CLINIC_OR_DEPARTMENT_OTHER): Payer: BLUE CROSS/BLUE SHIELD

## 2015-09-14 DIAGNOSIS — R109 Unspecified abdominal pain: Secondary | ICD-10-CM | POA: Diagnosis present

## 2015-09-14 DIAGNOSIS — E119 Type 2 diabetes mellitus without complications: Secondary | ICD-10-CM | POA: Diagnosis not present

## 2015-09-14 DIAGNOSIS — J45909 Unspecified asthma, uncomplicated: Secondary | ICD-10-CM | POA: Insufficient documentation

## 2015-09-14 DIAGNOSIS — R300 Dysuria: Secondary | ICD-10-CM | POA: Insufficient documentation

## 2015-09-14 DIAGNOSIS — F17200 Nicotine dependence, unspecified, uncomplicated: Secondary | ICD-10-CM | POA: Diagnosis not present

## 2015-09-14 LAB — URINALYSIS, ROUTINE W REFLEX MICROSCOPIC
Bilirubin Urine: NEGATIVE
GLUCOSE, UA: NEGATIVE mg/dL
HGB URINE DIPSTICK: NEGATIVE
KETONES UR: NEGATIVE mg/dL
Leukocytes, UA: NEGATIVE
Nitrite: NEGATIVE
PROTEIN: NEGATIVE mg/dL
Specific Gravity, Urine: 1.013 (ref 1.005–1.030)
pH: 6 (ref 5.0–8.0)

## 2015-09-14 NOTE — Discharge Instructions (Signed)
Ibuprofen 600 mg every 6 hours as needed for pain.  Return to the ER if symptoms significantly worsen or change.   Flank Pain Flank pain refers to pain that is located on the side of the body between the upper abdomen and the back. The pain may occur over a short period of time (acute) or may be long-term or reoccurring (chronic). It may be mild or severe. Flank pain can be caused by many things. CAUSES  Some of the more common causes of flank pain include:  Muscle strains.   Muscle spasms.   A disease of your spine (vertebral disk disease).   A lung infection (pneumonia).   Fluid around your lungs (pulmonary edema).   A kidney infection.   Kidney stones.   A very painful skin rash caused by the chickenpox virus (shingles).   Gallbladder disease.  HOME CARE INSTRUCTIONS  Home care will depend on the cause of your pain. In general,  Rest as directed by your caregiver.  Drink enough fluids to keep your urine clear or pale yellow.  Only take over-the-counter or prescription medicines as directed by your caregiver. Some medicines may help relieve the pain.  Tell your caregiver about any changes in your pain.  Follow up with your caregiver as directed. SEEK IMMEDIATE MEDICAL CARE IF:   Your pain is not controlled with medicine.   You have new or worsening symptoms.  Your pain increases.   You have abdominal pain.   You have shortness of breath.   You have persistent nausea or vomiting.   You have swelling in your abdomen.   You feel faint or pass out.   You have blood in your urine.  You have a fever or persistent symptoms for more than 2-3 days.  You have a fever and your symptoms suddenly get worse. MAKE SURE YOU:   Understand these instructions.  Will watch your condition.  Will get help right away if you are not doing well or get worse.   This information is not intended to replace advice given to you by your health care provider.  Make sure you discuss any questions you have with your health care provider.   Document Released: 06/07/2005 Document Revised: 01/09/2012 Document Reviewed: 11/29/2011 Elsevier Interactive Patient Education [employer]

## 2015-09-14 NOTE — ED Notes (Signed)
Pt in c/o returning flank pain and fever after being dx with pyelo last week. States has not finished abx yet, sx had gone away but returned today. Follow up appt next week. Ambulatory in NAD.

## 2015-09-14 NOTE — ED Provider Notes (Signed)
CSN: [id number]     Arrival date & time 09/14/15  1829 History  By signing my name below, I, [facility], attest that this documentation has been prepared under the direction and in the presence of [name], MD. Electronically Signed: Randell Patient, ED Scribe. 09/14/2015. 8:25 PM.   Chief Complaint  Patient presents with  . Flank Pain   HPI Comments: HPI Comments: Karen Patrick is a 50 y.o. female with an hx pf DM and asthma who presents to the Emergency Department complaining of constant, gradually worsening left flank pain onset 2 weeks ago and that reoccurred earlier today. Pt states that she was dx with a pyelonephritis 2 weeks ago and was prescribed Keflex. She notes that she has been taking Keflex which resolved her symptoms before they returned earlier today. She reports fever and dysuria currently. Pain is worse with movement and long periods of sitting still. She notes similar symptoms two weeks ago when her flank pain first occurred. Denies any other symptoms currently.   Patient is a 50 y.o. female presenting with flank pain. The history is provided by the patient. No language interpreter was used.  Flank Pain This is a recurrent problem. The current episode started 6 to 12 hours ago. The problem occurs constantly. The problem has been gradually worsening. The symptoms are aggravated by twisting and bending.    Past Medical History  Diagnosis Date  . Diabetes mellitus   . Asthma   . Ankle fracture, right    Past Surgical History  Procedure Laterality Date  . Hammer toe surgery    . Lipoma excision    . Knee arthrocentesis    . Foot fasciotomy    . Endometrial ablation w/ novasure    . Back surgery    . Lipoma removed     Family History  Problem Relation Age of Onset  . Stroke Mother   . Diabetes Mother   . Hypertension Mother   . Stroke Father   . Sudden death Father   . Hypertension Father   . Hyperlipidemia Father   . Heart attack Father   .  Diabetes Father   . Diabetes Maternal Grandmother   . Heart attack Maternal Grandmother   . Hypertension Maternal Grandmother   . Diabetes Paternal Grandmother   . Sudden death Paternal Grandmother    Social History  Substance Use Topics  . Smoking status: Current Every Day Smoker  . Smokeless tobacco: Never Used  . Alcohol Use: No   OB History    No data available     Review of Systems  Constitutional: Positive for fever.  Genitourinary: Positive for dysuria and flank pain.  All other systems reviewed and are negative.   Allergies  Review of patient's allergies indicates no known allergies.  Home Medications   Prior to Admission medications   Medication Sig Start Date End Date Taking? Authorizing Provider  Ascorbic Acid (VITAMIN C) 100 MG tablet Take 100 mg by mouth daily.    Historical Provider, MD  atorvastatin (LIPITOR) 10 MG tablet Take 5 mg by mouth every other day.     Historical Provider, MD  benzonatate (TESSALON) 100 MG capsule Take 1 capsule (100 mg total) by mouth every 8 (eight) hours. 06/14/15   [name], PA-C  cephALEXin (KEFLEX) 500 MG capsule Take 1 capsule (500 mg total) by mouth 3 (three) times daily. 09/01/15   [name], MD  chlorpheniramine-HYDROcodone [facility]) 10-8 MG/5ML SUER Take 5 mLs by mouth  at bedtime as needed for cough. 06/14/15   [name], PA-C  cholecalciferol (VITAMIN D) 1000 UNITS tablet Take 1,000 Units by mouth daily.    Historical Provider, MD  Cranberry 400 MG TABS Take 400 mg by mouth daily.    Historical Provider, MD  estrogen, conjugated,-medroxyprogesterone (PREMPRO) 0.3-1.5 MG tablet Take 1 tablet by mouth daily. 12/03/14   Historical Provider, MD  Boris Lown Oil 1000 MG CAPS Take 1,000 mg by mouth daily.    Historical Provider, MD  Liraglutide (VICTOZA [place]) Inject into the skin.    Historical Provider, MD  metFORMIN (GLUCOPHAGE) 1000 MG tablet Take 1,000 mg by mouth daily with breakfast.     Historical  Provider, MD  metFORMIN (GLUCOPHAGE) 1000 MG tablet Take 1 tablet (1,000 mg total) by mouth 2 (two) times daily. Patient taking differently: Take 1,000 mg by mouth daily.  09/22/12   [name], MD  metroNIDAZOLE (FLAGYL) 500 MG tablet Take 1 tablet (500 mg total) by mouth 2 (two) times daily. 12/12/14   McKean N Rumley, DO  Multiple Vitamin (MULTIVITAMIN) capsule Take 1 capsule by mouth daily.    Historical Provider, MD  ondansetron (ZOFRAN ODT) 8 MG disintegrating tablet 8mg  ODT q4 hours prn nausea 03/14/15   [name], MD   BP 153/97 mmHg  Pulse 80  Temp(Src) 98.6 F (37 C) (Oral)  Resp 18  Ht 5\' 3"  (1.6 m)  Wt 189 lb (85.73 kg)  BMI 33.49 kg/m2  SpO2 99% Physical Exam  Constitutional: She is oriented to person, place, and time. She appears well-developed and well-nourished. No distress.  HENT:  Head: Normocephalic and atraumatic.  Eyes: EOM are normal.  Neck: Normal range of motion.  Cardiovascular: Normal rate, regular rhythm and normal heart sounds.   Pulmonary/Chest: Effort normal and breath sounds normal.  Abdominal: Soft. Bowel sounds are normal. She exhibits no distension. There is tenderness.  TTP in left flank  Musculoskeletal: Normal range of motion.  Neurological: She is alert and oriented to person, place, and time.  Skin: Skin is warm and dry.  Psychiatric: She has a normal mood and affect. Judgment normal.  Nursing note and vitals reviewed.   ED Course  Procedures   DIAGNOSTIC STUDIES: Oxygen Saturation is 99% on RA, normal by my interpretation.    COORDINATION OF CARE: 7:25 PM Will order renal stone study CT. Discussed treatment plan with pt at bedside and pt agreed to plan.   Labs Review Labs Reviewed  URINALYSIS, ROUTINE W REFLEX MICROSCOPIC (NOT AT [facility])    Imaging Review Ct Renal Stone Study  09/14/2015  CLINICAL DATA:  LEFT flank pain since yesterday, diagnosed with pyelonephritis 2 weeks ago, history diabetes mellitus, asthma, smoking  EXAM: CT ABDOMEN AND PELVIS WITHOUT CONTRAST TECHNIQUE: Multidetector CT imaging of the abdomen and pelvis was performed following the standard protocol without IV contrast. Sagittal and coronal MPR images reconstructed from axial data set. Oral contrast not given for this indication. COMPARISON:  08/15/2008 FINDINGS: Lung bases clear. No renal calcification, hydronephrosis or ureteral dilatation. Normal appearing ureters without calcification or dilatation. Tiny wall calcification at superior aspect of urinary bladder unchanged since 2010. No distal ureteral or vesicular calculi. Liver, spleen, gallbladder, pancreas and adrenal glands unremarkable for technique. Normal appendix, uterus, and adnexa. Stomach and bowel loops unremarkable for technique. Scattered normal size mesenteric lymph nodes. Small BILATERAL inguinal hernias containing fat. No mass, adenopathy, free air, free fluid or inflammatory process. Osseous structures unremarkable. IMPRESSION: Small BILATERAL inguinal hernias containing fat. Otherwise  negative exam. Electronically Signed   By: Ulyses Southward M.D.   On: 09/14/2015 20:14   I have personally reviewed and evaluated these images and lab results as part of my medical decision-making.    MDM   Final diagnoses:  None    CT is negative for renal calculus and urine is clear. Her symptoms must be musculoskeletal in nature as I found no other explanation. She does have a low-grade fever, the significance of which I am uncertain. She will be discharged with Tylenol and Motrin, and when necessary return. Her abdomen is otherwise benign and I see no other source for fever. She is nontoxic appearing.  I personally performed the services described in this documentation, which was scribed in my presence. The recorded information has been reviewed and is accurate.       [name], MD 09/14/15 2055

## 2015-11-27 ENCOUNTER — Encounter (HOSPITAL_BASED_OUTPATIENT_CLINIC_OR_DEPARTMENT_OTHER): Payer: Self-pay | Admitting: Respiratory Therapy

## 2015-11-27 ENCOUNTER — Emergency Department (HOSPITAL_BASED_OUTPATIENT_CLINIC_OR_DEPARTMENT_OTHER)
Admission: EM | Admit: 2015-11-27 | Discharge: 2015-11-27 | Disposition: A | Discharge status: Home / Self Care | Payer: BLUE CROSS/BLUE SHIELD | Attending: Emergency Medicine | Admitting: Emergency Medicine

## 2015-11-27 DIAGNOSIS — Z7984 Long term (current) use of oral hypoglycemic drugs: Secondary | ICD-10-CM | POA: Diagnosis not present

## 2015-11-27 DIAGNOSIS — E119 Type 2 diabetes mellitus without complications: Secondary | ICD-10-CM | POA: Diagnosis not present

## 2015-11-27 DIAGNOSIS — F17200 Nicotine dependence, unspecified, uncomplicated: Secondary | ICD-10-CM | POA: Insufficient documentation

## 2015-11-27 DIAGNOSIS — R05 Cough: Secondary | ICD-10-CM | POA: Diagnosis present

## 2015-11-27 DIAGNOSIS — J45901 Unspecified asthma with (acute) exacerbation: Secondary | ICD-10-CM

## 2015-11-27 MED ORDER — ALBUTEROL SULFATE (2.5 MG/3ML) 0.083% IN NEBU
2.5000 mg | INHALATION_SOLUTION | Freq: Once | RESPIRATORY_TRACT | Status: AC
  Administered 2015-11-27: 2.5 mg via RESPIRATORY_TRACT
  Filled 2015-11-27: qty 3

## 2015-11-27 MED ORDER — FLUTICASONE PROPIONATE HFA 44 MCG/ACT IN AERO
2.0000 | INHALATION_SPRAY | Freq: Two times a day (BID) | RESPIRATORY_TRACT | Status: DC
  Administered 2015-11-27: 2 via RESPIRATORY_TRACT
  Filled 2015-11-27: qty 10.6

## 2015-11-27 MED ORDER — IPRATROPIUM-ALBUTEROL 0.5-2.5 (3) MG/3ML IN SOLN
3.0000 mL | Freq: Once | RESPIRATORY_TRACT | Status: AC
  Administered 2015-11-27: 3 mL via RESPIRATORY_TRACT
  Filled 2015-11-27: qty 3

## 2015-11-27 MED ORDER — ALBUTEROL SULFATE HFA 108 (90 BASE) MCG/ACT IN AERS
2.0000 | INHALATION_SPRAY | RESPIRATORY_TRACT | Status: DC | PRN
  Filled 2015-11-27: qty 6.7

## 2015-11-27 NOTE — Patient Instructions (Signed)
RT instructed patient in the correct use of using a MDI with a spacer.  Patient demonstrated the correct technique using a MDI with a spacer.

## 2015-11-27 NOTE — ED Provider Notes (Signed)
[facility] Provider Note   CSN: [id number] Arrival date & time: 11/27/15  0425  First provider contact: 4:39 AM     History   Chief Complaint Chief Complaint  Patient presents with  . Cough    HPI Karen Patrick is a 50 y.o. female with a history of asthma. She is here with about a one-month history of respiratory symptoms including cough, wheezing and shortness of breath. She has been using her inhaler but ran out. She is here this morning with cough sometimes productive of yellow sputum, chest tightness, shortness of breath and wheezing. Symptoms are worse when lying supine or exerting herself. She describes symptoms as moderate to severe. She denies fever. She also has a hoarse voice.  HPI  Past Medical History:  Diagnosis Date  . Ankle fracture, right   . Asthma   . Diabetes mellitus     Patient Active Problem List   Diagnosis Date Noted  . Right ankle injury 11/02/2010    Past Surgical History:  Procedure Laterality Date  . BACK SURGERY    . ENDOMETRIAL ABLATION W/ NOVASURE    . FOOT FASCIOTOMY    . HAMMER TOE SURGERY    . KNEE ARTHROCENTESIS    . LIPOMA EXCISION    . lipoma removed      OB History    No data available       Home Medications    Prior to Admission medications   Medication Sig Start Date End Date Taking? Authorizing Provider  Ascorbic Acid (VITAMIN C) 100 MG tablet Take 100 mg by mouth daily.    Historical Provider, MD  atorvastatin (LIPITOR) 10 MG tablet Take 5 mg by mouth every other day.     Historical Provider, MD  benzonatate (TESSALON) 100 MG capsule Take 1 capsule (100 mg total) by mouth every 8 (eight) hours. 06/14/15   [name], PA-C  cephALEXin (KEFLEX) 500 MG capsule Take 1 capsule (500 mg total) by mouth 3 (three) times daily. 09/01/15   [name], MD  chlorpheniramine-HYDROcodone [facility]) 10-8 MG/5ML SUER Take 5 mLs by mouth at bedtime as needed for cough. 06/14/15   [name], PA-C    cholecalciferol (VITAMIN D) 1000 UNITS tablet Take 1,000 Units by mouth daily.    Historical Provider, MD  Cranberry 400 MG TABS Take 400 mg by mouth daily.    Historical Provider, MD  estrogen, conjugated,-medroxyprogesterone (PREMPRO) 0.3-1.5 MG tablet Take 1 tablet by mouth daily. 12/03/14   Historical Provider, MD  Boris Lown Oil 1000 MG CAPS Take 1,000 mg by mouth daily.    Historical Provider, MD  Liraglutide (VICTOZA [place]) Inject into the skin.    Historical Provider, MD  metFORMIN (GLUCOPHAGE) 1000 MG tablet Take 1,000 mg by mouth daily with breakfast.     Historical Provider, MD  metFORMIN (GLUCOPHAGE) 1000 MG tablet Take 1 tablet (1,000 mg total) by mouth 2 (two) times daily. Patient taking differently: Take 1,000 mg by mouth daily.  09/22/12   [name], MD  metroNIDAZOLE (FLAGYL) 500 MG tablet Take 1 tablet (500 mg total) by mouth 2 (two) times daily. 12/12/14   [place] N Rumley, DO  Multiple Vitamin (MULTIVITAMIN) capsule Take 1 capsule by mouth daily.    Historical Provider, MD  ondansetron (ZOFRAN ODT) 8 MG disintegrating tablet 8mg  ODT q4 hours prn nausea 03/14/15   [name], MD    Family History Family History  Problem Relation Age of Onset  . Stroke Mother   .  Diabetes Mother   . Hypertension Mother   . Stroke Father   . Sudden death Father   . Hypertension Father   . Hyperlipidemia Father   . Heart attack Father   . Diabetes Father   . Diabetes Maternal Grandmother   . Heart attack Maternal Grandmother   . Hypertension Maternal Grandmother   . Diabetes Paternal Grandmother   . Sudden death Paternal Grandmother     Social History Social History  Substance Use Topics  . Smoking status: Current Every Day Smoker  . Smokeless tobacco: Never Used  . Alcohol use No     Allergies   Review of patient's allergies indicates no known allergies.   Review of Systems Review of Systems  All other systems reviewed and are negative.   Physical Exam Updated Vital  Signs BP 144/93 (BP Location: Right Arm)   Pulse 88   Temp 98.3 F (36.8 C) (Oral)   Resp 20   Ht 5' 2.5" (1.588 m)   Wt 195 lb (88.5 kg)   SpO2 100%   BMI 35.10 kg/m   Physical Exam General: Well-developed, well-nourished female in no acute distress; appearance consistent with age of record HENT: normocephalic; atraumatic; dysphonia Eyes: pupils equal, round and reactive to light; extraocular muscles intact Neck: supple Heart: regular rate and rhythm Lungs: Decreased air movement bilaterally Abdomen: soft; nondistended; nontender; bowel sounds present Extremities: No deformity; full range of motion; pulses normal Neurologic: Awake, alert and oriented; motor function intact in all extremities and symmetric; no facial droop Skin: Warm and dry Psychiatric: Anxious    ED Treatments / Results    EKG Interpretation  Date/Time:  Sunday November 27 2015 04:39:24 EDT Ventricular Rate:  80 PR Interval:    QRS Duration: 89 QT Interval:  381 QTC Calculation: 440 R Axis:   49 Text Interpretation:  Sinus rhythm Normal ECG No significant change was found Confirmed by Kaziah Krizek  MD, Jonny Ruiz ([id number]) on 11/27/2015 4:43:43 AM        Procedures Procedures (including critical care time)   Final Clinical Impressions(s) / ED Diagnoses  5:32 AM Air movement significantly improved after neb treatment 2. Patient feels able to go home at this time. We will refill her albuterol inhaler and also providing Flovent inhaler. We'll avoid systemic steroids due to her diabetes.  Final diagnoses:  Asthma exacerbation      Paula Libra, MD 11/27/15 [phone]

## 2015-11-27 NOTE — ED Notes (Signed)
Pt made aware to return if symptoms worsen or if any life threatening symptoms occur.   

## 2015-11-27 NOTE — ED Notes (Signed)
MD at bedside. 

## 2015-11-27 NOTE — ED Triage Notes (Signed)
Pt reports cold symptoms with productive cough, yellow sputum.  Pt reports that she gets sob and sometimes feels like there is something sitting on her chest. Pt has chest pressure at this time, pt sounds hoarse.  Pt has hx of asthma, has been taking proventil but ran out.

## 2015-12-03 ENCOUNTER — Encounter (HOSPITAL_BASED_OUTPATIENT_CLINIC_OR_DEPARTMENT_OTHER): Payer: Self-pay | Admitting: *Deleted

## 2015-12-03 DIAGNOSIS — R030 Elevated blood-pressure reading, without diagnosis of hypertension: Secondary | ICD-10-CM | POA: Insufficient documentation

## 2015-12-03 DIAGNOSIS — J45909 Unspecified asthma, uncomplicated: Secondary | ICD-10-CM | POA: Insufficient documentation

## 2015-12-03 DIAGNOSIS — E119 Type 2 diabetes mellitus without complications: Secondary | ICD-10-CM | POA: Insufficient documentation

## 2015-12-03 DIAGNOSIS — R51 Headache: Secondary | ICD-10-CM | POA: Insufficient documentation

## 2015-12-03 DIAGNOSIS — F17200 Nicotine dependence, unspecified, uncomplicated: Secondary | ICD-10-CM | POA: Diagnosis not present

## 2015-12-03 DIAGNOSIS — Z7984 Long term (current) use of oral hypoglycemic drugs: Secondary | ICD-10-CM | POA: Insufficient documentation

## 2015-12-03 NOTE — ED Triage Notes (Signed)
Pt states she has had blurry vision and nausea for several days. She took her blood pressure tonight and states it was elevated 150-160/90-110's

## 2015-12-04 ENCOUNTER — Encounter (HOSPITAL_BASED_OUTPATIENT_CLINIC_OR_DEPARTMENT_OTHER): Payer: Self-pay | Admitting: Emergency Medicine

## 2015-12-04 ENCOUNTER — Emergency Department (HOSPITAL_COMMUNITY): Payer: BLUE CROSS/BLUE SHIELD

## 2015-12-04 ENCOUNTER — Emergency Department (HOSPITAL_BASED_OUTPATIENT_CLINIC_OR_DEPARTMENT_OTHER): Payer: BLUE CROSS/BLUE SHIELD

## 2015-12-04 ENCOUNTER — Emergency Department (HOSPITAL_BASED_OUTPATIENT_CLINIC_OR_DEPARTMENT_OTHER)
Admission: EM | Admit: 2015-12-04 | Discharge: 2015-12-04 | Disposition: A | Discharge status: Home / Self Care | Payer: BLUE CROSS/BLUE SHIELD | Attending: Emergency Medicine | Admitting: Emergency Medicine

## 2015-12-04 DIAGNOSIS — IMO0001 Reserved for inherently not codable concepts without codable children: Secondary | ICD-10-CM

## 2015-12-04 DIAGNOSIS — R519 Headache, unspecified: Secondary | ICD-10-CM

## 2015-12-04 DIAGNOSIS — R030 Elevated blood-pressure reading, without diagnosis of hypertension: Secondary | ICD-10-CM

## 2015-12-04 DIAGNOSIS — R51 Headache: Secondary | ICD-10-CM

## 2015-12-04 LAB — BASIC METABOLIC PANEL
Anion gap: 9 (ref 5–15)
BUN: 13 mg/dL (ref 6–20)
CHLORIDE: 101 mmol/L (ref 101–111)
CO2: 27 mmol/L (ref 22–32)
CREATININE: 0.63 mg/dL (ref 0.44–1.00)
Calcium: 9.8 mg/dL (ref 8.9–10.3)
Glucose, Bld: 195 mg/dL — ABNORMAL HIGH (ref 65–99)
POTASSIUM: 3.8 mmol/L (ref 3.5–5.1)
SODIUM: 137 mmol/L (ref 135–145)

## 2015-12-04 LAB — URINE MICROSCOPIC-ADD ON

## 2015-12-04 LAB — URINALYSIS, ROUTINE W REFLEX MICROSCOPIC
BILIRUBIN URINE: NEGATIVE
Glucose, UA: NEGATIVE mg/dL
Hgb urine dipstick: NEGATIVE
Ketones, ur: NEGATIVE mg/dL
NITRITE: NEGATIVE
PROTEIN: NEGATIVE mg/dL
SPECIFIC GRAVITY, URINE: 1.023 (ref 1.005–1.030)
pH: 6 (ref 5.0–8.0)

## 2015-12-04 MED ORDER — TRAMADOL HCL 50 MG PO TABS: 50.0000 mg | ORAL_TABLET | Freq: Four times a day (QID) | ORAL | 0 refills | Status: DC | PRN

## 2015-12-04 MED ORDER — TRAMADOL HCL 50 MG PO TABS
100.0000 mg | ORAL_TABLET | Freq: Once | ORAL | Status: AC
  Administered 2015-12-04: 100 mg via ORAL
  Filled 2015-12-04: qty 2

## 2015-12-04 MED ORDER — ACETAMINOPHEN 500 MG PO TABS
1000.0000 mg | ORAL_TABLET | Freq: Once | ORAL | Status: AC
  Administered 2015-12-04: 1000 mg via ORAL
  Filled 2015-12-04: qty 2

## 2015-12-04 MED ORDER — ACETAMINOPHEN 500 MG PO TABS: 1000.0000 mg | ORAL_TABLET | Freq: Four times a day (QID) | ORAL | 0 refills | Status: DC | PRN

## 2015-12-04 MED ORDER — DIPHENHYDRAMINE HCL 50 MG/ML IJ SOLN
12.5000 mg | Freq: Once | INTRAMUSCULAR | Status: AC
  Administered 2015-12-04: 12.5 mg via INTRAMUSCULAR
  Filled 2015-12-04: qty 1

## 2015-12-04 MED ORDER — ONDANSETRON 4 MG PO TBDP
4.0000 mg | ORAL_TABLET | Freq: Once | ORAL | Status: AC
  Administered 2015-12-04: 4 mg via ORAL
  Filled 2015-12-04: qty 1

## 2015-12-04 MED ORDER — METOCLOPRAMIDE HCL 5 MG/ML IJ SOLN
10.0000 mg | Freq: Once | INTRAMUSCULAR | Status: AC
  Administered 2015-12-04: 10 mg via INTRAMUSCULAR
  Filled 2015-12-04: qty 2

## 2015-12-04 NOTE — ED Notes (Signed)
Pt transported to CT at this time.

## 2015-12-04 NOTE — ED Provider Notes (Signed)
08:50 patient reassessed and MRI results reviewed. Patient's blood pressures are within normal range. She is alert and appropriate. Patient reports slight frontal headache between her eyes. She reports she feels much better. At this time, MRI does not suggest PRES. There is no acute infarct present and no other acute structural anomaly identified. At this time with the patient's blood pressure normalized, I do not plan to empirically start antihypertensives. Patient is counseled for follow-up with her PCP this week for ongoing evaluation and possible neurology referral if headaches persist. For pain, patient is provided acetaminophen to take and to add tramadol if needed.   Arby Barrette, MD 12/04/15 [phone]

## 2015-12-04 NOTE — ED Notes (Signed)
MD at bedside discussing results with patient and family at this time.

## 2015-12-04 NOTE — ED Provider Notes (Signed)
[facility] Provider Note   CSN: [id number] Arrival date & time: 12/03/15  2318  First Provider Contact:  First MD Initiated Contact with Patient 12/04/15 0123        History   Chief Complaint Chief Complaint  Patient presents with  . Hypertension    HPI Sera Hitsman is a 50 y.o. female.  The history is provided by the patient.  Migraine  This is a new problem. The current episode started more than 1 week ago (8 days). The problem occurs constantly. The problem has not changed since onset.Associated symptoms include headaches. Pertinent negatives include no chest pain, no abdominal pain and no shortness of breath. Exacerbated by: leaning forward. Nothing relieves the symptoms. She has tried acetaminophen for the symptoms. The treatment provided no relief.  Patient seen here 8 days ago for asthma exacerbation and has been having a frontal HA since.  States he BP has been in the 160s.  No speech abnormalities.  Pain is constant. She has had emesis.  No LOC.  No focal weakness.  No f/c/r.  No trauma.    Past Medical History:  Diagnosis Date  . Ankle fracture, right   . Asthma   . Diabetes mellitus     Patient Active Problem List   Diagnosis Date Noted  . Right ankle injury 11/02/2010    Past Surgical History:  Procedure Laterality Date  . BACK SURGERY    . ENDOMETRIAL ABLATION W/ NOVASURE    . FOOT FASCIOTOMY    . HAMMER TOE SURGERY    . KNEE ARTHROCENTESIS    . LIPOMA EXCISION    . lipoma removed      OB History    No data available       Home Medications    Prior to Admission medications   Medication Sig Start Date End Date Taking? Authorizing Provider  Ascorbic Acid (VITAMIN C) 100 MG tablet Take 100 mg by mouth daily.    Historical Provider, MD  atorvastatin (LIPITOR) 10 MG tablet Take 5 mg by mouth every other day.     Historical Provider, MD  benzonatate (TESSALON) 100 MG capsule Take 1 capsule (100 mg total) by mouth every 8 (eight) hours.  06/14/15   [name], PA-C  cephALEXin (KEFLEX) 500 MG capsule Take 1 capsule (500 mg total) by mouth 3 (three) times daily. 09/01/15   [name], MD  chlorpheniramine-HYDROcodone [facility]) 10-8 MG/5ML SUER Take 5 mLs by mouth at bedtime as needed for cough. 06/14/15   [name], PA-C  cholecalciferol (VITAMIN D) 1000 UNITS tablet Take 1,000 Units by mouth daily.    Historical Provider, MD  Cranberry 400 MG TABS Take 400 mg by mouth daily.    Historical Provider, MD  estrogen, conjugated,-medroxyprogesterone (PREMPRO) 0.3-1.5 MG tablet Take 1 tablet by mouth daily. 12/03/14   Historical Provider, MD  Boris Lown Oil 1000 MG CAPS Take 1,000 mg by mouth daily.    Historical Provider, MD  Liraglutide (VICTOZA [place]) Inject into the skin.    Historical Provider, MD  metFORMIN (GLUCOPHAGE) 1000 MG tablet Take 1,000 mg by mouth daily with breakfast.     Historical Provider, MD  metFORMIN (GLUCOPHAGE) 1000 MG tablet Take 1 tablet (1,000 mg total) by mouth 2 (two) times daily. Patient taking differently: Take 1,000 mg by mouth daily.  09/22/12   [name], MD  metroNIDAZOLE (FLAGYL) 500 MG tablet Take 1 tablet (500 mg total) by mouth 2 (two) times daily. 12/12/14  Araceli Bouche, DO  Multiple Vitamin (MULTIVITAMIN) capsule Take 1 capsule by mouth daily.    Historical Provider, MD  ondansetron (ZOFRAN ODT) 8 MG disintegrating tablet  ODT q4 hours prn nausea 03/14/15   [name], MD    Family History Family History  Problem Relation Age of Onset  . Stroke Mother   . Diabetes Mother   . Hypertension Mother   . Stroke Father   . Sudden death Father   . Hypertension Father   . Hyperlipidemia Father   . Heart attack Father   . Diabetes Father   . Diabetes Maternal Grandmother   . Heart attack Maternal Grandmother   . Hypertension Maternal Grandmother   . Diabetes Paternal Grandmother   . Sudden death Paternal Grandmother     Social History Social History  Substance  Use Topics  . Smoking status: Current Every Day Smoker  . Smokeless tobacco: Never Used  . Alcohol use No     Allergies   Review of patient's allergies indicates no known allergies.   Review of Systems Review of Systems  Constitutional: Negative for fever.  Respiratory: Negative for shortness of breath.   Cardiovascular: Negative for chest pain.  Gastrointestinal: Negative for abdominal pain.  Musculoskeletal: Negative for neck pain and neck stiffness.  Skin: Negative for rash.  Neurological: Positive for headaches. Negative for dizziness, seizures, syncope, facial asymmetry, speech difficulty, weakness, light-headedness and numbness.     Physical Exam Updated Vital Signs BP 144/90 (BP Location: Right Arm)   Pulse 82   Temp 98.3 F (36.8 C) (Oral)   Resp 16   Ht  (1.575 m)   Wt 193 lb (87.5 kg)   SpO2 99%   BMI 35.30 kg/m   Physical Exam  Constitutional: She is oriented to person, place, and time. She appears well-developed and well-nourished. No distress.  HENT:  Head: Normocephalic and atraumatic.  Nose: Nose normal.  Mouth/Throat: No oropharyngeal exudate.  Eyes: Conjunctivae and EOM are normal. Pupils are equal, round, and reactive to light.  Neck: Normal range of motion. Neck supple.  Cardiovascular: Normal rate, regular rhythm and intact distal pulses.   Pulmonary/Chest: Effort normal and breath sounds normal. No respiratory distress. She has no wheezes. She has no rales.  Abdominal: Soft. Bowel sounds are normal. There is no tenderness. There is no guarding.  Musculoskeletal: Normal range of motion.  Neurological: She is alert and oriented to person, place, and time. She has normal reflexes. She displays normal reflexes. No cranial nerve deficit. She exhibits normal muscle tone.  Skin: Skin is warm and dry. Capillary refill takes less than 2 seconds. No rash noted.  Psychiatric: She has a normal mood and affect.     ED Treatments / Results   Labs (all labs ordered are listed, but only abnormal results are displayed) Labs Reviewed  URINALYSIS, ROUTINE W REFLEX MICROSCOPIC (NOT AT [facility]) - Abnormal; Notable for the following:       Result Value   APPearance CLOUDY (*)    Leukocytes, UA TRACE (*)    All other components within normal limits  BASIC METABOLIC PANEL - Abnormal; Notable for the following:    Glucose, Bld 195 (*)    All other components within normal limits  URINE MICROSCOPIC-ADD ON - Abnormal; Notable for the following:    Squamous Epithelial / LPF 6-30 (*)    Bacteria, UA MANY (*)    All other components within normal limits    EKG  EKG Interpretation  Date/Time:  Sunday December 04 2015 00:40:03 EDT Ventricular Rate:  86 PR Interval:  152 QRS Duration: 86 QT Interval:  368 QTC Calculation: 440 R Axis:   50 Text Interpretation:  Normal sinus rhythm Confirmed by [facility]  MD, Yarisa Lynam ([id number]) on 12/04/2015 1:23:06 AM       Radiology Dg Chest 2 View  Result Date: 12/04/2015 CLINICAL DATA:  50 year old female with blurry vision and hypertension EXAM: CHEST  2 VIEW COMPARISON:  Chest radiograph dated 06/14/2015 FINDINGS: The heart size and mediastinal contours are within normal limits. Both lungs are clear. The visualized skeletal structures are unremarkable. IMPRESSION: No active cardiopulmonary disease. Electronically Signed   By: Elgie Collard M.D.   On: 12/04/2015 02:09    Procedures Procedures (including critical care time)  Medications Ordered in ED Medications  metoCLOPramide (REGLAN) injection 10 mg (not administered)  diphenhydrAMINE (BENADRYL) injection 12.5 mg (not administered)  ondansetron (ZOFRAN-ODT) disintegrating tablet 4 mg (4 mg Oral Given 12/04/15 0130)     Initial Impression / Assessment and Plan / ED Course  I have reviewed the triage vital signs and the nursing notes.  Pertinent labs & imaging results that were available during my care of the patient were reviewed by me and  considered in my medical decision making (see chart for details).  Clinical Course    Vitals:   12/04/15 0119 12/04/15 0138  BP: 144/90 144/90  Pulse: 82 82  Resp:  16  Temp:     Results for orders placed or performed during the hospital encounter of 12/04/15  Urinalysis, Routine w reflex microscopic (not at [facility])  Result Value Ref Range   Color, Urine YELLOW YELLOW   APPearance CLOUDY (A) CLEAR   Specific Gravity, Urine 1.023 1.005 - 1.030   pH 6.0 5.0 - 8.0   Glucose, UA NEGATIVE NEGATIVE mg/dL   Hgb urine dipstick NEGATIVE NEGATIVE   Bilirubin Urine NEGATIVE NEGATIVE   Ketones, ur NEGATIVE NEGATIVE mg/dL   Protein, ur NEGATIVE NEGATIVE mg/dL   Nitrite NEGATIVE NEGATIVE   Leukocytes, UA TRACE (A) NEGATIVE  Basic metabolic panel  Result Value Ref Range   Sodium 137 135 - 145 mmol/L   Potassium 3.8 3.5 - 5.1 mmol/L   Chloride 101 101 - 111 mmol/L   CO2 27 22 - 32 mmol/L   Glucose, Bld 195 (H) 65 - 99 mg/dL   BUN 13 6 - 20 mg/dL   Creatinine, Ser 6.04 0.44 - 1.00 mg/dL   Calcium 9.8 8.9 - 54.0 mg/dL   GFR calc non Af Amer >60 >60 mL/min   GFR calc Af Amer >60 >60 mL/min   Anion gap 9 5 - 15  Urine microscopic-add on  Result Value Ref Range   Squamous Epithelial / LPF 6-30 (A) NONE SEEN   WBC, UA 0-5 0 - 5 WBC/hpf   RBC / HPF 0-5 0 - 5 RBC/hpf   Bacteria, UA MANY (A) NONE SEEN   Dg Chest 2 View  Result Date: 12/04/2015 CLINICAL DATA:  50 year old female with blurry vision and hypertension EXAM: CHEST  2 VIEW COMPARISON:  Chest radiograph dated 06/14/2015 FINDINGS: The heart size and mediastinal contours are within normal limits. Both lungs are clear. The visualized skeletal structures are unremarkable. IMPRESSION: No active cardiopulmonary disease. Electronically Signed   By: Elgie Collard M.D.   On: 12/04/2015 02:09   Ct Head Wo Contrast  Result Date: 12/04/2015 CLINICAL DATA:  Pain, blurry vision and nausea for several days. EXAM: CT HEAD WITHOUT  CONTRAST  TECHNIQUE: Contiguous axial images were obtained from the base of the skull through the vertex without intravenous contrast. COMPARISON:  None. FINDINGS: Brain: Equivocal diminished attenuation in the posterior cortical and subcortical occipital lobes.No intracranial hemorrhage or midline shift. No hydrocephalus. The basilar cisterns are patent. No evidence of territorial infarct. No intracranial fluid collection. Vascular: No hyperdense vessel or abnormal calcification. Skull:  Calvarium is intact. Sinuses/Orbits: Remote right orbital fracture. Included paranasal sinuses and mastoid air cells are well aerated. Other: Soft tissue prominence in the posterior nasopharynx is likely a prominent portal soft tissue, unchanged from head CT 09/30/2014 IMPRESSION: Equivocal diminished attenuation in the posterior occipital lobes, may be artifact, however can be seen in the setting of PRES (posterior reversible encephalopathy syndrome). MRI can be considered for further evaluation. There is otherwise no acute abnormality. Electronically Signed   By: Rubye Oaks M.D.   On: 12/04/2015 03:14   Medications  ondansetron (ZOFRAN-ODT) disintegrating tablet 4 mg (4 mg Oral Given 12/04/15 0130)  metoCLOPramide (REGLAN) injection 10 mg (10 mg Intramuscular Given 12/04/15 0246)  diphenhydrAMINE (BENADRYL) injection 12.5 mg (12.5 mg Intramuscular Given 12/04/15 0246)     Final Clinical Impressions(s) / ED Diagnoses   Final diagnoses:  None  case d/w Dr. Amada Jupiter, transfer to the ED for MRI of the brain.  If normal can be d/c if positive call back.  Can go POV as long as she is not driving.    Case d/w Dr. Elesa Massed who is aware of transfer.    All questions answered to patient's satisfaction. Based on history and exam patient has been appropriately medically screened and emergency conditions excluded. Patient is stable for discharge at this time. Follow up with your PMDfor recheck in 2 daysand strict return precautions  given.   New Prescriptions New Prescriptions   No medications on file     Gwenlyn Hottinger, MD 12/04/15 [phone]

## 2015-12-04 NOTE — ED Notes (Signed)
Report called to Tresa Endo, Consulting civil engineer at [employer].

## 2015-12-04 NOTE — ED Provider Notes (Signed)
5:15 AM  Pt transferred from [facility].  Pt is a 50 y.o. female with history of asthma, diabetes who presented to the emergency department with 8 days of a headache. Mildly hypertensive in the emergency department. No focal neurologic deficits. CT of the head showed no bleed possible PRES.  MRI recommended. Dr. Amada Jupiter with neurology was consult and who agreed with MRI. Patient reports feeling much better currently, no current headache. She states she is not on anything for blood pressure at home.  BP 146/91.  Will obtain MRI brain for dispo.  Plan was if the MRI was negative, patient can be discharged home.   Layla Maw Seymone Forlenza, DO 12/04/15 [phone]

## 2015-12-04 NOTE — ED Notes (Signed)
Pt had an episode of vomiting, zofran ODT given.

## 2015-12-04 NOTE — ED Notes (Signed)
Pt presents with numerous complaints. Pt sts having headache, nausea, and nosebleeds the past few days.

## 2015-12-04 NOTE — ED Notes (Signed)
Pt has left at this time to transport by private vehicle to [facility].

## 2015-12-04 NOTE — ED Notes (Signed)
Pt aware we need a urine sample, unable at this time

## 2015-12-04 NOTE — ED Notes (Signed)
MD at bedside. 

## 2016-05-02 ENCOUNTER — Encounter (HOSPITAL_BASED_OUTPATIENT_CLINIC_OR_DEPARTMENT_OTHER): Payer: Self-pay

## 2016-05-02 ENCOUNTER — Emergency Department (HOSPITAL_BASED_OUTPATIENT_CLINIC_OR_DEPARTMENT_OTHER)
Admission: EM | Admit: 2016-05-02 | Discharge: 2016-05-02 | Disposition: A | Discharge status: Home / Self Care | Payer: BLUE CROSS/BLUE SHIELD | Attending: Emergency Medicine | Admitting: Emergency Medicine

## 2016-05-02 DIAGNOSIS — Z79899 Other long term (current) drug therapy: Secondary | ICD-10-CM | POA: Insufficient documentation

## 2016-05-02 DIAGNOSIS — K029 Dental caries, unspecified: Secondary | ICD-10-CM | POA: Insufficient documentation

## 2016-05-02 DIAGNOSIS — E119 Type 2 diabetes mellitus without complications: Secondary | ICD-10-CM | POA: Diagnosis not present

## 2016-05-02 DIAGNOSIS — F17200 Nicotine dependence, unspecified, uncomplicated: Secondary | ICD-10-CM | POA: Diagnosis not present

## 2016-05-02 DIAGNOSIS — J45909 Unspecified asthma, uncomplicated: Secondary | ICD-10-CM | POA: Insufficient documentation

## 2016-05-02 DIAGNOSIS — K0889 Other specified disorders of teeth and supporting structures: Secondary | ICD-10-CM | POA: Diagnosis present

## 2016-05-02 HISTORY — DX: Depression, unspecified: F32.A

## 2016-05-02 HISTORY — DX: Gastro-esophageal reflux disease without esophagitis: K21.9

## 2016-05-02 HISTORY — DX: Schizoaffective disorder, unspecified: F25.9

## 2016-05-02 HISTORY — DX: Major depressive disorder, single episode, unspecified: F32.9

## 2016-05-02 MED ORDER — OXYCODONE-ACETAMINOPHEN 5-325 MG PO TABS: 1.0000 | ORAL_TABLET | Freq: Four times a day (QID) | ORAL | 0 refills | Status: DC | PRN

## 2016-05-02 MED ORDER — PENICILLIN V POTASSIUM 500 MG PO TABS: 500.0000 mg | ORAL_TABLET | Freq: Three times a day (TID) | ORAL | 0 refills | Status: DC

## 2016-05-02 NOTE — Discharge Instructions (Signed)
Penicillin as prescribed.  Percocet as prescribed as needed for pain.  Follow-up with a dentist in the next 2-3 days, and return to the ER if your symptoms significantly worsen or change.

## 2016-05-02 NOTE — ED Triage Notes (Signed)
Pt c/o broken tooth on lower left jaw that started hurting yesterday

## 2016-05-02 NOTE — ED Provider Notes (Signed)
[facility]   CSN: [id number] Arrival date & time: 05/02/16  2002  By signing my name below, I, Linna Darner, attest that this documentation has been prepared under the direction and in the presence of physician practitioner, [name], MD. Electronically Signed: Linna Darner, Scribe. 05/02/2016. 8:52 PM.  History   Chief Complaint Chief Complaint  Patient presents with  . Dental Pain    The history is provided by the patient. No language interpreter was used.     HPI Comments: Karen Patrick is a 51 y.o. female who presents to the Emergency Department complaining of constant, worsening, left lower dental pain for about one month. She states she has had a chipped tooth in her left lower jaw for an extended period of time but did not start experiencing pain until about a month ago. She states her dental pain worsened severely a few days ago. She notes some occasional bleeding from her left lower gums. She states ibuprofen improved her pain initially but has provided no relief over the last few days. She is not followed by a dentist. She denies facial swelling, fever, chills, trouble swallowing, or any other associated symptoms.  Past Medical History:  Diagnosis Date  . Ankle fracture, right   . Asthma   . Depression   . Diabetes mellitus   . GERD (gastroesophageal reflux disease)   . Schizoaffective disorder [facility])     Patient Active Problem List   Diagnosis Date Noted  . Right ankle injury 11/02/2010    Past Surgical History:  Procedure Laterality Date  . BACK SURGERY    . ENDOMETRIAL ABLATION W/ NOVASURE    . FOOT FASCIOTOMY    . HAMMER TOE SURGERY    . KNEE ARTHROCENTESIS    . LIPOMA EXCISION    . lipoma removed      OB History    No data available       Home Medications    Prior to Admission medications   Medication Sig Start Date End Date Taking? Authorizing Provider  acetaminophen (TYLENOL) 500 MG tablet Take 2 tablets (1,000 mg  total) by mouth every 6 (six) hours as needed. 12/04/15   Arby Barrette, MD  Ascorbic Acid (VITAMIN C) 100 MG tablet Take 100 mg by mouth daily.    Historical Provider, MD  atorvastatin (LIPITOR) 10 MG tablet Take 5 mg by mouth every other day.     Historical Provider, MD  cholecalciferol (VITAMIN D) 1000 UNITS tablet Take 1,000 Units by mouth daily.    Historical Provider, MD  Cranberry 400 MG TABS Take 400 mg by mouth daily.    Historical Provider, MD  estrogen, conjugated,-medroxyprogesterone (PREMPRO) 0.3-1.5 MG tablet Take 1 tablet by mouth daily. 12/03/14   Historical Provider, MD  fluticasone (FLOVENT HFA) 220 MCG/ACT inhaler Inhale 2 puffs into the lungs daily.    Historical Provider, MD  Boris Lown Oil 1000 MG CAPS Take 1,000 mg by mouth daily.    Historical Provider, MD  Liraglutide (VICTOZA) 18 MG/3ML SOPN Inject 1.8 mg into the skin daily.    Historical Provider, MD  metFORMIN (GLUCOPHAGE) 1000 MG tablet Take 1 tablet (1,000 mg total) by mouth 2 (two) times daily. Patient taking differently: Take 1,000 mg by mouth daily.  09/22/12   [name], MD  Multiple Vitamin (MULTIVITAMIN) capsule Take 1 capsule by mouth daily.    Historical Provider, MD  ondansetron (ZOFRAN ODT) 8 MG disintegrating tablet 8mg  ODT q4 hours prn nausea 03/14/15  [name], MD  traMADol (ULTRAM) 50 MG tablet Take 1 tablet (50 mg total) by mouth every 6 (six) hours as needed (1-2 tablets every 6 hours with acetaminophen if needed for pain). 12/04/15   Arby Barrette, MD    Family History Family History  Problem Relation Age of Onset  . Stroke Mother   . Diabetes Mother   . Hypertension Mother   . Stroke Father   . Sudden death Father   . Hypertension Father   . Hyperlipidemia Father   . Heart attack Father   . Diabetes Father   . Diabetes Maternal Grandmother   . Heart attack Maternal Grandmother   . Hypertension Maternal Grandmother   . Diabetes Paternal Grandmother   . Sudden death Paternal Grandmother      Social History Social History  Substance Use Topics  . Smoking status: Current Every Day Smoker  . Smokeless tobacco: Never Used  . Alcohol use No     Allergies   Patient has no known allergies.   Review of Systems Review of Systems  Constitutional: Negative for chills and fever.  HENT: Positive for dental problem (left lower). Negative for facial swelling and trouble swallowing.   All other systems reviewed and are negative.    Physical Exam Updated Vital Signs BP 154/95 (BP Location: Left Arm)   Pulse 84   Temp 98.4 F (36.9 C) (Oral)   Resp 20   Ht 5\' 3"  (1.6 m)   Wt 187 lb (84.8 kg)   SpO2 100%   BMI 33.13 kg/m   Physical Exam  Constitutional: She is oriented to person, place, and time. She appears well-developed and well-nourished.  HENT:  Head: Normocephalic.  The left lower molars are heavily decayed with gingival inflammation, however no obvious abscess. No swelling in the neck or floor of the mouth.  Eyes: EOM are normal.  Neck: Normal range of motion.  Pulmonary/Chest: Effort normal.  Abdominal: She exhibits no distension.  Musculoskeletal: Normal range of motion.  Neurological: She is alert and oriented to person, place, and time.  Psychiatric: She has a normal mood and affect.  Nursing note and vitals reviewed.    ED Treatments / Results  Labs (all labs ordered are listed, but only abnormal results are displayed) Labs Reviewed - No data to display  EKG  EKG Interpretation None       Radiology No results found.  Procedures Procedures (including critical care time)  DIAGNOSTIC STUDIES: Oxygen Saturation is 100% on RA, normal by my interpretation.    COORDINATION OF CARE: 8:56 PM Discussed treatment plan with pt at bedside and pt agreed to plan.  Medications Ordered in ED Medications - No data to display   Initial Impression / Assessment and Plan / ED Course  I have reviewed the triage vital signs and the nursing  notes.  Pertinent labs & imaging results that were available during my care of the patient were reviewed by me and considered in my medical decision making (see chart for details).  Clinical Course     Dental pain. Will be treated with penicillin, pain medication, and follow-up with dentistry.  Final Clinical Impressions(s) / ED Diagnoses   Final diagnoses:  None    New Prescriptions New Prescriptions   No medications on file   I personally performed the services described in this documentation, which was scribed in my presence. The recorded information has been reviewed and is accurate.       [name], MD 05/02/16  2213  

## 2016-06-02 ENCOUNTER — Encounter (HOSPITAL_BASED_OUTPATIENT_CLINIC_OR_DEPARTMENT_OTHER): Payer: Self-pay | Admitting: Emergency Medicine

## 2016-06-02 ENCOUNTER — Emergency Department (HOSPITAL_BASED_OUTPATIENT_CLINIC_OR_DEPARTMENT_OTHER)
Admission: EM | Admit: 2016-06-02 | Discharge: 2016-06-03 | Disposition: A | Discharge status: Home / Self Care | Payer: BLUE CROSS/BLUE SHIELD | Attending: Psychiatry | Admitting: Psychiatry

## 2016-06-02 DIAGNOSIS — R45851 Suicidal ideations: Secondary | ICD-10-CM

## 2016-06-02 DIAGNOSIS — F17210 Nicotine dependence, cigarettes, uncomplicated: Secondary | ICD-10-CM | POA: Diagnosis not present

## 2016-06-02 DIAGNOSIS — J45909 Unspecified asthma, uncomplicated: Secondary | ICD-10-CM | POA: Diagnosis not present

## 2016-06-02 DIAGNOSIS — T1491XA Suicide attempt, initial encounter: Secondary | ICD-10-CM | POA: Diagnosis not present

## 2016-06-02 DIAGNOSIS — F4321 Adjustment disorder with depressed mood: Secondary | ICD-10-CM | POA: Diagnosis not present

## 2016-06-02 DIAGNOSIS — T50904A Poisoning by unspecified drugs, medicaments and biological substances, undetermined, initial encounter: Secondary | ICD-10-CM | POA: Diagnosis not present

## 2016-06-02 DIAGNOSIS — F259 Schizoaffective disorder, unspecified: Secondary | ICD-10-CM | POA: Diagnosis not present

## 2016-06-02 DIAGNOSIS — Z79899 Other long term (current) drug therapy: Secondary | ICD-10-CM | POA: Insufficient documentation

## 2016-06-02 DIAGNOSIS — E119 Type 2 diabetes mellitus without complications: Secondary | ICD-10-CM | POA: Diagnosis not present

## 2016-06-02 DIAGNOSIS — T402X1A Poisoning by other opioids, accidental (unintentional), initial encounter: Secondary | ICD-10-CM | POA: Diagnosis present

## 2016-06-02 DIAGNOSIS — Z823 Family history of stroke: Secondary | ICD-10-CM | POA: Diagnosis not present

## 2016-06-02 LAB — RAPID URINE DRUG SCREEN, HOSP PERFORMED
AMPHETAMINES: NOT DETECTED
BARBITURATES: NOT DETECTED
Benzodiazepines: NOT DETECTED
COCAINE: NOT DETECTED
OPIATES: NOT DETECTED
TETRAHYDROCANNABINOL: NOT DETECTED

## 2016-06-02 LAB — SALICYLATE LEVEL

## 2016-06-02 LAB — CBC
HEMATOCRIT: 38.6 % (ref 36.0–46.0)
HEMOGLOBIN: 12.6 g/dL (ref 12.0–15.0)
MCH: 29.6 pg (ref 26.0–34.0)
MCHC: 32.6 g/dL (ref 30.0–36.0)
MCV: 90.6 fL (ref 78.0–100.0)
Platelets: 209 10*3/uL (ref 150–400)
RBC: 4.26 MIL/uL (ref 3.87–5.11)
RDW: 12.9 % (ref 11.5–15.5)
WBC: 7.7 10*3/uL (ref 4.0–10.5)

## 2016-06-02 LAB — COMPREHENSIVE METABOLIC PANEL
ALBUMIN: 4 g/dL (ref 3.5–5.0)
ALK PHOS: 76 U/L (ref 38–126)
ALT: 19 U/L (ref 14–54)
AST: 18 U/L (ref 15–41)
Anion gap: 8 (ref 5–15)
BUN: 16 mg/dL (ref 6–20)
CALCIUM: 8.7 mg/dL — AB (ref 8.9–10.3)
CHLORIDE: 106 mmol/L (ref 101–111)
CO2: 23 mmol/L (ref 22–32)
CREATININE: 0.58 mg/dL (ref 0.44–1.00)
GFR calc non Af Amer: >60 mL/min (ref >60)
GLUCOSE: 141 mg/dL — AB (ref 65–99)
Potassium: 3.5 mmol/L (ref 3.5–5.1)
SODIUM: 137 mmol/L (ref 135–145)
Total Bilirubin: 0.4 mg/dL (ref 0.3–1.2)
Total Protein: 7.2 g/dL (ref 6.5–8.1)

## 2016-06-02 LAB — ETHANOL: Alcohol, Ethyl (B): <5 mg/dL (ref <5)

## 2016-06-02 LAB — ACETAMINOPHEN LEVEL
ACETAMINOPHEN (TYLENOL), SERUM: 25 ug/mL (ref 10–30)
ACETAMINOPHEN (TYLENOL), SERUM: 16 ug/mL (ref 10–30)

## 2016-06-02 LAB — CBG MONITORING, ED: GLUCOSE-CAPILLARY: 127 mg/dL — AB (ref 65–99)

## 2016-06-02 MED ORDER — DIPHENHYDRAMINE HCL 50 MG/ML IJ SOLN
INTRAMUSCULAR | Status: AC
  Filled 2016-06-02: qty 1

## 2016-06-02 MED ORDER — DIPHENHYDRAMINE HCL 50 MG/ML IJ SOLN
25.0000 mg | Freq: Once | INTRAMUSCULAR | Status: AC
  Administered 2016-06-02: 25 mg via INTRAVENOUS

## 2016-06-02 MED ORDER — DIPHENHYDRAMINE HCL 50 MG/ML IJ SOLN
25.0000 mg | Freq: Once | INTRAMUSCULAR | Status: AC
  Administered 2016-06-02: 25 mg via INTRAVENOUS
  Filled 2016-06-02: qty 1

## 2016-06-02 NOTE — ED Provider Notes (Signed)
[facility] Provider Note   CSN: [id number] Arrival date & time: 06/02/16  1627  By signing my name below, I, Talbert Nan, attest that this documentation has been prepared under the direction and in the presence of Josh Ramello Cordial PA-C. Electronically Signed: Talbert Nan, Scribe. 06/02/16. 5:00 PM.   History   Chief Complaint Chief Complaint  Patient presents with  . Ingestion    HPI Karen Patrick is a 51 y.o. female with h/o Schizoaffective disorder who presents to the Emergency Department complaining ingesting between 9 and 12 percocet 3 hrs ago. Pt reports that she vomited. Pt took 2 Excedrin yesterday at 12 am last night. Pt drank 1 beer 3.5 hours ago. States that she has been manic and been having bad thoughts. Pt states that she has occasional hallucinations. Pt describes voices in her head telling her to kill herself and to hurt self. Pt drank EtOH last night, which is not normal. Pt reports that she has support from friends and husband. Pt has hx of abusing drugs and cutting her wrist.   The history is provided by the patient. No language interpreter was used.    Past Medical History:  Diagnosis Date  . Ankle fracture, right   . Asthma   . Depression   . Diabetes mellitus   . GERD (gastroesophageal reflux disease)   . Schizoaffective disorder [facility])     Patient Active Problem List   Diagnosis Date Noted  . Right ankle injury 11/02/2010    Past Surgical History:  Procedure Laterality Date  . BACK SURGERY    . ENDOMETRIAL ABLATION W/ NOVASURE    . FOOT FASCIOTOMY    . HAMMER TOE SURGERY    . KNEE ARTHROCENTESIS    . LIPOMA EXCISION    . lipoma removed      OB History    No data available       Home Medications    Prior to Admission medications   Medication Sig Start Date End Date Taking? Authorizing Provider  acetaminophen (TYLENOL) 500 MG tablet Take 2 tablets (1,000 mg total) by mouth every 6 (six) hours as needed. 12/04/15   Arby Barrette, MD    Ascorbic Acid (VITAMIN C) 100 MG tablet Take 100 mg by mouth daily.    Historical Provider, MD  atorvastatin (LIPITOR) 10 MG tablet Take 5 mg by mouth every other day.     Historical Provider, MD  cholecalciferol (VITAMIN D) 1000 UNITS tablet Take 1,000 Units by mouth daily.    Historical Provider, MD  Cranberry 400 MG TABS Take 400 mg by mouth daily.    Historical Provider, MD  estrogen, conjugated,-medroxyprogesterone (PREMPRO) 0.3-1.5 MG tablet Take 1 tablet by mouth daily. 12/03/14   Historical Provider, MD  fluticasone (FLOVENT HFA) 220 MCG/ACT inhaler Inhale 2 puffs into the lungs daily.    Historical Provider, MD  Boris Lown Oil 1000 MG CAPS Take 1,000 mg by mouth daily.    Historical Provider, MD  Liraglutide (VICTOZA) 18 MG/3ML SOPN Inject 1.8 mg into the skin daily.    Historical Provider, MD  metFORMIN (GLUCOPHAGE) 1000 MG tablet Take 1 tablet (1,000 mg total) by mouth 2 (two) times daily. Patient taking differently: Take 1,000 mg by mouth daily.  09/22/12   [name], MD  Multiple Vitamin (MULTIVITAMIN) capsule Take 1 capsule by mouth daily.    Historical Provider, MD  ondansetron (ZOFRAN ODT) 8 MG disintegrating tablet 8mg  ODT q4 hours prn nausea 03/14/15   [name], MD  oxyCODONE-acetaminophen (PERCOCET) 5-325 MG tablet Take 1-2 tablets by mouth every 6 (six) hours as needed. 05/02/16   [name], MD  penicillin v potassium (VEETID) 500 MG tablet Take 1 tablet (500 mg total) by mouth 3 (three) times daily. 05/02/16   [name], MD  traMADol (ULTRAM) 50 MG tablet Take 1 tablet (50 mg total) by mouth every 6 (six) hours as needed (1-2 tablets every 6 hours with acetaminophen if needed for pain). 12/04/15   Arby Barrette, MD    Family History Family History  Problem Relation Age of Onset  . Stroke Mother   . Diabetes Mother   . Hypertension Mother   . Stroke Father   . Sudden death Father   . Hypertension Father   . Hyperlipidemia Father   . Heart attack Father   . Diabetes  Father   . Diabetes Maternal Grandmother   . Heart attack Maternal Grandmother   . Hypertension Maternal Grandmother   . Diabetes Paternal Grandmother   . Sudden death Paternal Grandmother     Social History Social History  Substance Use Topics  . Smoking status: Current Every Day Smoker  . Smokeless tobacco: Never Used  . Alcohol use No     Allergies   Patient has no known allergies.   Review of Systems Review of Systems  Constitutional: Negative for fever.  HENT: Negative for rhinorrhea and sore throat.   Eyes: Negative for redness.  Respiratory: Negative for cough.   Cardiovascular: Negative for chest pain.  Gastrointestinal: Negative for abdominal pain, diarrhea, nausea and vomiting.  Genitourinary: Negative for dysuria.  Musculoskeletal: Negative for myalgias.  Skin: Negative for rash.  Neurological: Negative for headaches.  Psychiatric/Behavioral: Positive for hallucinations, self-injury and suicidal ideas. The patient is nervous/anxious.      Physical Exam Updated Vital Signs BP 116/82 (BP Location: Right Arm)   Pulse 76   Temp 97.8 F (36.6 C) (Oral)   Resp 16   Ht 5\' 3"  (1.6 m)   Wt 189 lb (85.7 kg)   SpO2 100%   BMI 33.48 kg/m   Physical Exam  Constitutional: She is oriented to person, place, and time. She appears well-developed and well-nourished.  HENT:  Head: Normocephalic and atraumatic.  Mouth/Throat: Oropharynx is clear and moist.  Eyes: Conjunctivae are normal. Right eye exhibits no discharge. Left eye exhibits no discharge.  Neck: Normal range of motion. Neck supple.  Cardiovascular: Normal rate, regular rhythm and normal heart sounds.   No murmur heard. Pulmonary/Chest: Effort normal and breath sounds normal. No respiratory distress. She has no wheezes. She has no rales.  Abdominal: Soft. There is no tenderness. There is no rebound and no guarding.  Neurological: She is alert and oriented to person, place, and time.  Skin: Skin is warm  and dry.  Psychiatric: Her speech is normal and behavior is normal. Her affect is labile. She exhibits a depressed mood. She expresses suicidal ideation. She expresses no homicidal ideation.  Nursing note and vitals reviewed.    ED Treatments / Results   DIAGNOSTIC STUDIES: Oxygen Saturation is 100% on room air, normal by my interpretation.    COORDINATION OF CARE: 5:00 PM Discussed treatment plan with pt at bedside and pt agreed to plan, which includes checking that patient stays awake, talking to psych services.  5:20 PM Reevaluated treatment plan with pt at bedside and pt agreed to plan.   Labs (all labs ordered are listed, but only abnormal results are displayed) Labs Reviewed  COMPREHENSIVE  METABOLIC PANEL - Abnormal; Notable for the following:       Result Value   Glucose, Bld 141 (*)    Calcium 8.7 (*)    All other components within normal limits  CBG MONITORING, ED - Abnormal; Notable for the following:    Glucose-Capillary 127 (*)    All other components within normal limits  ETHANOL  SALICYLATE LEVEL  ACETAMINOPHEN LEVEL  CBC  RAPID URINE DRUG SCREEN, HOSP PERFORMED  ACETAMINOPHEN LEVEL    EKG  EKG Interpretation  Date/Time:  Saturday June 02 2016 16:45:05 EST Ventricular Rate:  74 PR Interval:    QRS Duration: 91 QT Interval:  411 QTC Calculation: 456 R Axis:   19 Text Interpretation:  Sinus rhythm LVH by voltage Baseline wander in lead(s) V6 No significant change since last tracing Confirmed by Ethelda Chick  MD, SAM [phone]) on 06/02/2016 4:49:13 PM       Radiology No results found.  Procedures Procedures (including critical care time)  Medications Ordered in ED Medications  diphenhydrAMINE (BENADRYL) injection 25 mg (25 mg Intravenous Given 06/02/16 1735)  diphenhydrAMINE (BENADRYL) injection 25 mg (25 mg Intravenous Given 06/02/16 2034)     Initial Impression / Assessment and Plan / ED Course  I have reviewed the triage vital signs and the  nursing notes.  Pertinent labs & imaging results that were available during my care of the patient were reviewed by me and considered in my medical decision making (see chart for details).     Vital signs reviewed and are as follows: Vitals:   06/02/16 1900 06/02/16 2042  BP: 118/67 138/86  Pulse: 83 85  Resp: 13 18  Temp:     Pt medically cleared. 4hr Tylenol decreasing and is not toxic.   TTS eval complete. Pt meets inpatient criteria. Will transfer to [facility] for holding.  IVC by Dr. Ethelda Chick.  Dr. Jacqulyn Bath aware of patient transfer.     Final Clinical Impressions(s) / ED Diagnoses   Final diagnoses:  Suicidal ideation  Drug overdose, undetermined intent, initial encounter   Meets inpt criteria.   New Prescriptions New Prescriptions   No medications on file   I personally performed the services described in this documentation, which was scribed in my presence. The recorded information has been reviewed and is accurate.    Renne Crigler, PA-C 06/02/16 2252    Renne Crigler, PA-C 06/02/16 2253    Doug Sou, MD 06/02/16 [phone]

## 2016-06-02 NOTE — ED Notes (Signed)
Purse given to husband by Gaynelle Cage, RN

## 2016-06-02 NOTE — ED Notes (Signed)
ED Provider at bedside. 

## 2016-06-02 NOTE — ED Notes (Addendum)
Upon entering to assess pt. Pt was noted to be walking out to the lobby. Pt was asked to stop and pt was obviously anxious and tearful. Pt stating she was afraid, the lights were bright, and her thoughts were racing. Pt states she was just coming out to talk to her husband. Pt was able to be verbally deescalated and taken back to her room.   Pt states her mental illness has been well controlled for the past 16 years and that she has a strong support system. Pt has a support group of others with mental illness. Pt states she has recently ran out of her hormone replacement medicines, and that she has not had a routine lately. Pt thinks these have caused her to have a really bad episode. Pt states she only tried to take the medication to sleep. Pt endorses lack of sleep for past week.   Lights turned down, pt placed back on monitoring equipment, and pt's husband at bedside and appears supportive.

## 2016-06-02 NOTE — BH Assessment (Signed)
Tele Assessment Note   Karen Patrick is an 51 y.o. female who presents to the ED voluntarily. Pt reports she took about 9 or 12 Percocet. When pt was asked what triggered or led her to taking the medication she stated "I just want to go home." Pt refused to engage with the assessor. Pt presented to be in an irritable mood and continued fidgeting and making aggressive statements to the assessor including "are you going to send me home, if you're not going to let me go home then I don't want to talk to you." Pt was asked if she has any current SI and she stated "i'm okay now, I just want to go home." According to the chart, the pt has been hearing voices telling her to hang herself. Pt appears anxious in the ED constantly asking if she can go home. Pt's husband is also with her in the ED and reported to the RN that he wanted to take her home. Pt stated "I can't stay here. I have to work and take care of my grandson."  Per Nira Conn, NP pt meets criteria for inpt treatment.    Diagnosis: h/o Schizoaffective disorder  Past Medical History:  Past Medical History:  Diagnosis Date   Ankle fracture, right    Asthma    Depression    Diabetes mellitus    GERD (gastroesophageal reflux disease)    Schizoaffective disorder (HCC)     Past Surgical History:  Procedure Laterality Date   BACK SURGERY     ENDOMETRIAL ABLATION W/ NOVASURE     FOOT FASCIOTOMY     HAMMER TOE SURGERY     KNEE ARTHROCENTESIS     LIPOMA EXCISION     lipoma removed      Family History:  Family History  Problem Relation Age of Onset   Stroke Mother    Diabetes Mother    Hypertension Mother    Stroke Father    Sudden death Father    Hypertension Father    Hyperlipidemia Father    Heart attack Father    Diabetes Father    Diabetes Maternal Grandmother    Heart attack Maternal Grandmother    Hypertension Maternal Grandmother    Diabetes Paternal Grandmother    Sudden death Paternal  Grandmother     Social History:  reports that she has been smoking.  She has never used smokeless tobacco. She reports that she does not drink alcohol or use drugs.  Additional Social History:  Alcohol / Drug Use Pain Medications: See PTA meds  Prescriptions: See PTA meds  Over the Counter: See PTA meds  History of alcohol / drug use?:  (UTA, pt refused to engage with this assessor )  CIWA: CIWA-Ar BP: 138/86 Pulse Rate: 85 COWS:    PATIENT STRENGTHS: (choose at least two) Capable of independent living Financial means  Allergies: No Known Allergies  Home Medications:  (Not in a hospital admission)  OB/GYN Status:  No LMP recorded. Patient has had an ablation.  General Assessment Data Location of Assessment: [facility]) TTS Assessment: In system Is this a Tele or Face-to-Face Assessment?: Tele Assessment Is this an Initial Assessment or a Re-assessment for this encounter?: Initial Assessment Marital status: Married Is patient pregnant?: No Pregnancy Status: No Living Arrangements: Spouse/significant other Can pt return to current living arrangement?: Yes Admission Status: Voluntary (pt may be IVC'd if she refuses to sign in voluntarily ) Is patient capable of signing voluntary admission?: Yes Referral  Source: Self/Family/Friend Insurance type: BCBS     Crisis Care Plan Living Arrangements: Spouse/significant other Name of Psychiatrist: UTA, pt refused to engage with this assessor Name of Therapist: UTA, pt refused to engage with this assessor  Education Status Is patient currently in school?: No Highest grade of school patient has completed: UTA, pt refused to engage with this assessor  Risk to self with the past 6 months Suicidal Ideation: Yes-Currently Present Has patient been a risk to self within the past 6 months prior to admission? : Yes Suicidal Intent: No Has patient had any suicidal intent within the past 6 months prior to admission?  : No Is patient at risk for suicide?: Yes Suicidal Plan?: No Has patient had any suicidal plan within the past 6 months prior to admission? : No Access to Means: No What has been your use of drugs/alcohol within the last 12 months?: UTA, pt refused to engage with this assessor Previous Attempts/Gestures:  (UTA, pt refused to engage with this assessor) Triggers for Past Attempts: Unknown Intentional Self Injurious Behavior: None Family Suicide History: Unknown Recent stressful life event(s): Other (Comment) (UTA, pt refused to engage with this assessor) Persecutory voices/beliefs?: No Depression:  (UTA, pt refused to engage with this assessor) Depression Symptoms:  (UTA, pt refused to engage with this assessor) Substance abuse history and/or treatment for substance abuse?:  (UTA, pt refused to engage with this assessor) Suicide prevention information given to non-admitted patients: Not applicable  Risk to Others within the past 6 months Homicidal Ideation: No Does patient have any lifetime risk of violence toward others beyond the six months prior to admission? : No Thoughts of Harm to Others: No Current Homicidal Intent: No Current Homicidal Plan: No Access to Homicidal Means: No History of harm to others?: No Assessment of Violence: None Noted Does patient have access to weapons?:  (UTA, pt refused to engage with this assessor) Criminal Charges Pending?:  (UTA, pt refused to engage with this assessor) Does patient have a court date:  (UTA, pt refused to engage with this assessor) Is patient on probation?: Unknown (UTA, pt refused to engage with this assessor)  Psychosis Hallucinations:  (UTA, pt refused to engage with this assessor) Delusions: None noted  Mental Status Report Appearance/Hygiene: Disheveled Eye Contact: Poor Motor Activity: Freedom of movement Speech: Aggressive Level of Consciousness: Alert, Irritable Mood: Angry, Irritable, Anxious Affect: Angry,  Anxious Anxiety Level: Moderate Thought Processes: Coherent, Relevant Judgement: Impaired Orientation: Unable to assess Obsessive Compulsive Thoughts/Behaviors: None  Cognitive Functioning Concentration: Normal Memory: Unable to Assess IQ: Average Insight: Poor Impulse Control: Poor Appetite:  (UTA, pt refused to engage with this assessor) Sleep: Unable to Assess Vegetative Symptoms: Unable to Assess  ADLScreening [facility]) Patient's cognitive ability adequate to safely complete daily activities?: Yes Patient able to express need for assistance with ADLs?: Yes Independently performs ADLs?: Yes (appropriate for developmental age)  Prior Inpatient Therapy Prior Inpatient Therapy: No  Prior Outpatient Therapy Prior Outpatient Therapy: No Does patient have an ACCT team?: Unknown Does patient have Intensive In-House Services?  : Unknown Does patient have [facility] services? : Unknown Does patient have P4CC services?: Unknown  ADL Screening (condition at time of admission) Patient's cognitive ability adequate to safely complete daily activities?: Yes Is the patient deaf or have difficulty hearing?: No Does the patient have difficulty seeing, even when wearing glasses/contacts?: No Does the patient have difficulty concentrating, remembering, or making decisions?: No Patient able to express need for assistance with ADLs?: Yes  Does the patient have difficulty dressing or bathing?: No Independently performs ADLs?: Yes (appropriate for developmental age) Does the patient have difficulty walking or climbing stairs?: No Weakness of Legs: None Weakness of Arms/Hands: None  Home Assistive Devices/Equipment Home Assistive Devices/Equipment: None    Abuse/Neglect Assessment (Assessment to be complete while patient is alone) Physical Abuse:  (UTA, pt refused to engage with this assessor) Verbal Abuse:  (UTA, pt refused to engage with this assessor) Sexual Abuse:  (UTA,  pt refused to engage with this assessor) Exploitation of patient/patient's resources:  (UTA, pt refused to engage with this assessor) Self-Neglect:  (UTA, pt refused to engage with this assessor)     Advance Directives (For Healthcare) Does Patient Have a Medical Advance Directive?: No Would patient like information on creating a medical advance directive?: No - Patient declined    Additional Information 1:1 In Past 12 Months?: No CIRT Risk: No Elopement Risk: No Does patient have medical clearance?:  (pending)     Disposition:  Disposition Initial Assessment Completed for this Encounter: Yes Disposition of Patient: Inpatient treatment program Type of inpatient treatment program: Adult (per Nira Conn, NP )  Karolee Ohs 06/02/2016 10:02 PM

## 2016-06-02 NOTE — ED Notes (Signed)
This RN called Poison Control who advised at least a 5 hour observation. Reported to administer narcan if respiratory rate drops below 12. Advised to obtain a 4 hour tylenol level from the time of ingestion. Also, see if patient has taken any more tylenol in the past 24 hours. If the patient is stable, requires no narcan and has normal tylenol levels, pt can be dispositioned 6 hours after ingestion. If the patient is given narcan, the time of 6 hours must be started over.

## 2016-06-02 NOTE — ED Notes (Signed)
Pt prone, HOB 20 degrees, NAD, calm, rise and fall of chest noted, pt repositions self, lights dim, door open, sitter at [facility].

## 2016-06-02 NOTE — ED Notes (Signed)
ED Provider at bedside. Per Dr. Ethelda Chick pt reported to him that the voices she is hearing are telling her to hang herself. Pt now stating she wants to leave. Pt's husband also stating he wants to take her home. Dr. Ethelda Chick wanting to initiate IVC.

## 2016-06-02 NOTE — ED Notes (Signed)
Pt talking with TTS at this time.  

## 2016-06-02 NOTE — ED Triage Notes (Signed)
Patient states that she was "getting messages" in her sleep and taking 9 - 12 percocet was to try to not kill herself. The patient is tearful and states that she is scared and does not feel well at all.

## 2016-06-02 NOTE — ED Notes (Signed)
Pt in custody of [employer] and is departing at this time.

## 2016-06-02 NOTE — ED Notes (Signed)
Pt also c/o itching all over.

## 2016-06-02 NOTE — ED Notes (Signed)
Removed two soda cans from room per [facility]

## 2016-06-02 NOTE — ED Provider Notes (Addendum)
Patient took several Percocet tablets's stating "it was a lesser 2 evils." She states she didn't because she is hearing voices telling her to hang herself. Patient refusing to stay in the hospital. I feel that she is a strong suicide risk. Therefore will fall IVC affidavit for and first exam    Doug Sou, MD 06/02/16 2015 [facility] emergency department. Spoke with Dr. Jacqulyn Bath who accepts patient in transfer    Doug Sou, MD 06/02/16 2259

## 2016-06-02 NOTE — ED Notes (Signed)
Pt done talking with TTS. Pt angry and is says she does not want to talk. Pt states they asked her about where she works, and she is paranoid that they are going to call up her work and get her fired. Pt states "they know I just got my degree and they want to get me fired." Unable to deescalate pt at this time.   Pt's husband also came into the room and is now resistant to the idea of admitting the pt. Pt's husband stating that "you people do what you want to do. You don't know what she needs."

## 2016-06-02 NOTE — ED Notes (Signed)
[facility] here to transport pt to [facility]

## 2016-06-02 NOTE — [facility] Counselor (Signed)
No appropriate beds at [facility] per [facility]. Pt to be transferred to [facility] to await placement and bed availability. Case discussed with Doug Sou, MD and Murvin Donning, RN. Pt will need to be IVC'd and sherrif transport arranged for the pt to be transported to [facility]. Charge nurse of [facility] to notify charge nurse of [facility] that the pt will need to be transferred.   Princess Bruins, MSW, Theresia Majors

## 2016-06-02 NOTE — ED Notes (Signed)
Pt requesting something to eat. Pa advised and orders given.

## 2016-06-02 NOTE — ED Notes (Signed)
Pt and husband are agreeable to plan of care at this point. IVC paperwork in process and awaiting for TSS to be consulted.

## 2016-06-02 NOTE — ED Notes (Signed)
Pt's husband brought pt's purse and cell phone charger to add to pt's belongings

## 2016-06-03 ENCOUNTER — Encounter (HOSPITAL_COMMUNITY): Payer: Self-pay | Admitting: Registered Nurse

## 2016-06-03 DIAGNOSIS — R45851 Suicidal ideations: Secondary | ICD-10-CM | POA: Insufficient documentation

## 2016-06-03 DIAGNOSIS — F17210 Nicotine dependence, cigarettes, uncomplicated: Secondary | ICD-10-CM | POA: Diagnosis not present

## 2016-06-03 DIAGNOSIS — Z79891 Long term (current) use of opiate analgesic: Secondary | ICD-10-CM

## 2016-06-03 DIAGNOSIS — T50901A Poisoning by unspecified drugs, medicaments and biological substances, accidental (unintentional), initial encounter: Secondary | ICD-10-CM | POA: Insufficient documentation

## 2016-06-03 DIAGNOSIS — Z79899 Other long term (current) drug therapy: Secondary | ICD-10-CM | POA: Diagnosis not present

## 2016-06-03 DIAGNOSIS — T1491XA Suicide attempt, initial encounter: Secondary | ICD-10-CM | POA: Diagnosis not present

## 2016-06-03 DIAGNOSIS — T50904A Poisoning by unspecified drugs, medicaments and biological substances, undetermined, initial encounter: Secondary | ICD-10-CM | POA: Diagnosis not present

## 2016-06-03 DIAGNOSIS — F4321 Adjustment disorder with depressed mood: Secondary | ICD-10-CM

## 2016-06-03 DIAGNOSIS — Z833 Family history of diabetes mellitus: Secondary | ICD-10-CM

## 2016-06-03 DIAGNOSIS — Z8249 Family history of ischemic heart disease and other diseases of the circulatory system: Secondary | ICD-10-CM

## 2016-06-03 DIAGNOSIS — Z823 Family history of stroke: Secondary | ICD-10-CM

## 2016-06-03 HISTORY — DX: Adjustment disorder with depressed mood: F43.21

## 2016-06-03 LAB — CBG MONITORING, ED
GLUCOSE-CAPILLARY: 187 mg/dL — AB (ref 65–99)
Glucose-Capillary: 156 mg/dL — ABNORMAL HIGH (ref 65–99)

## 2016-06-03 MED ORDER — HYDROXYZINE HCL 25 MG PO TABS: 25.0000 mg | ORAL_TABLET | Freq: Three times a day (TID) | ORAL | Status: DC | PRN

## 2016-06-03 MED ORDER — HYDROXYZINE HCL 25 MG PO TABS: 25.0000 mg | ORAL_TABLET | Freq: Three times a day (TID) | ORAL | 0 refills | Status: DC | PRN

## 2016-06-03 MED ORDER — TRAZODONE HCL 50 MG PO TABS: 50.0000 mg | ORAL_TABLET | Freq: Every evening | ORAL | 0 refills | Status: DC | PRN

## 2016-06-03 MED ORDER — TRAZODONE HCL 50 MG PO TABS: 50.0000 mg | ORAL_TABLET | Freq: Every evening | ORAL | Status: DC | PRN

## 2016-06-03 NOTE — Progress Notes (Signed)
CSW filed patient's Notice of Commitment Change into IVC logbook.  

## 2016-06-03 NOTE — Consult Note (Addendum)
[facility] Consult   Reason for Consult:  Accidental overdose pain medication Referring Physician:  EDP Patient Identification: Karen Patrick MRN:  [id number] Principal Diagnosis: Adjustment disorder with depressed mood Diagnosis:   Patient Active Problem List   Diagnosis Date Noted  . Adjustment disorder with depressed mood [F43.21] 06/03/2016  . Right ankle injury [S99.911A] 11/02/2010    Total Time spent with patient: 45 minutes  Subjective:   Karen Patrick is a 51 y.o. female patient presented to [facility] after taking to much of her pain medication.  HPI:  [name] 51 y.o. female patient seen by Dr. Darleene Cleaver and this provider.  Chart reviewed 06/03/16.   On evaluation:  Karen Patrick reports that she took her pain medication yesterday but it was not an attempt of suicide "If anything it was an accidental overdose.  I didn't take all of them at one time.  I took 3 and later then 3 more; then later 3 more.  The only reason I came to the hospital is because my husband was concerned and wanted to make sure the medicine wasn't having any kind of affect on me."  Patient states that she has a history of schizoaffective disorder and "I hear voices form time to time but nothing bad.  I don't take the medication because I have more side effects from the medication and I have good coping skills.  My psychiatrist is aware and works with me."  Patient denies suicidal/homicidal ideation and paranoia.   Social work spoke with the husband of patient for collaboration.  He was in agreement with his wife and stated that he was comfortable with wife coming home.   Past Psychiatric History: Schizoaffective disorder  Risk to Self: Suicidal Ideation: denies Suicidal Intent: No Is patient at risk for suicide?: No Suicidal Plan?: No Access to Means: No What has been your use of drugs/alcohol within the last 12 months?: UTA, pt refused to engage with this assessor Triggers for Past Attempts:  Unknown Intentional Self Injurious Behavior: None Risk to Others: Homicidal Ideation: No Thoughts of Harm to Others: No Current Homicidal Intent: No Current Homicidal Plan: No Access to Homicidal Means: No History of harm to others?: No Assessment of Violence: None Noted Does patient have access to weapons?:  (UTA, pt refused to engage with this assessor) Criminal Charges Pending?:  (UTA, pt refused to engage with this assessor) Does patient have a court date:  (UTA, pt refused to engage with this assessor) Prior Inpatient Therapy: Prior Inpatient Therapy: No Prior Outpatient Therapy: Prior Outpatient Therapy: No Does patient have an ACCT team?: Unknown Does patient have Intensive In-House Services?  : Unknown Does patient have [facility] services? : Unknown Does patient have [facility] services?: Unknown  Past Medical History:  Past Medical History:  Diagnosis Date  . Ankle fracture, right   . Asthma   . Depression   . Diabetes mellitus   . GERD (gastroesophageal reflux disease)   . Schizoaffective disorder [facility])     Past Surgical History:  Procedure Laterality Date  . BACK SURGERY    . ENDOMETRIAL ABLATION W/ NOVASURE    . FOOT FASCIOTOMY    . HAMMER TOE SURGERY    . KNEE ARTHROCENTESIS    . LIPOMA EXCISION    . lipoma removed     Family History:  Family History  Problem Relation Age of Onset  . Stroke Mother   . Diabetes Mother   . Hypertension Mother   . Stroke Father   .  Sudden death Father   . Hypertension Father   . Hyperlipidemia Father   . Heart attack Father   . Diabetes Father   . Diabetes Maternal Grandmother   . Heart attack Maternal Grandmother   . Hypertension Maternal Grandmother   . Diabetes Paternal Grandmother   . Sudden death Paternal Grandmother    Family Psychiatric  History: denies Social History:  History  Alcohol Use No     History  Drug Use No    Social History   Social History  . Marital status: Married    Spouse name: N/A  .  Number of children: N/A  . Years of education: N/A   Social History Main Topics  . Smoking status: Current Every Day Smoker  . Smokeless tobacco: Never Used  . Alcohol use No  . Drug use: No  . Sexual activity: Yes   Other Topics Concern  . None   Social History Narrative  . None   Additional Social History:    Allergies:  No Known Allergies  Labs:  Results for orders placed or performed during the hospital encounter of 06/02/16 (from the past 48 hour(s))  Comprehensive metabolic panel     Status: Abnormal   Collection Time: 06/02/16  4:41 PM  Result Value Ref Range   Sodium 137 135 - 145 mmol/L   Potassium 3.5 3.5 - 5.1 mmol/L   Chloride 106 101 - 111 mmol/L   CO2 23 22 - 32 mmol/L   Glucose, Bld 141 (H) 65 - 99 mg/dL   BUN 16 6 - 20 mg/dL   Creatinine, Ser 0.58 0.44 - 1.00 mg/dL   Calcium 8.7 (L) 8.9 - 10.3 mg/dL   Total Protein 7.2 6.5 - 8.1 g/dL   Albumin 4.0 3.5 - 5.0 g/dL   AST 18 15 - 41 U/L   ALT 19 14 - 54 U/L   Alkaline Phosphatase 76 38 - 126 U/L   Total Bilirubin 0.4 0.3 - 1.2 mg/dL   GFR calc non Af Amer >60 >60 mL/min   GFR calc Af Amer >60 >60 mL/min    Comment: (NOTE) The eGFR has been calculated using the CKD EPI equation. This calculation has not been validated in all clinical situations. eGFR's persistently <60 mL/min signify possible Chronic Kidney Disease.    Anion gap 8 5 - 15  Ethanol     Status: None   Collection Time: 06/02/16  4:41 PM  Result Value Ref Range   Alcohol, Ethyl (B) <5 <5 mg/dL    Comment:        LOWEST DETECTABLE LIMIT FOR SERUM ALCOHOL IS 5 mg/dL FOR MEDICAL PURPOSES ONLY   Salicylate level     Status: None   Collection Time: 06/02/16  4:41 PM  Result Value Ref Range   Salicylate Lvl <4.3 2.8 - 30.0 mg/dL  Acetaminophen level     Status: None   Collection Time: 06/02/16  4:41 PM  Result Value Ref Range   Acetaminophen (Tylenol), Serum 25 10 - 30 ug/mL    Comment:        THERAPEUTIC CONCENTRATIONS  VARY SIGNIFICANTLY. A RANGE OF 10-30 ug/mL MAY BE AN EFFECTIVE CONCENTRATION FOR MANY PATIENTS. HOWEVER, SOME ARE BEST TREATED AT CONCENTRATIONS OUTSIDE THIS RANGE. ACETAMINOPHEN CONCENTRATIONS >150 ug/mL AT 4 HOURS AFTER INGESTION AND >50 ug/mL AT 12 HOURS AFTER INGESTION ARE OFTEN ASSOCIATED WITH TOXIC REACTIONS.   cbc     Status: None   Collection Time: 06/02/16  4:41 PM  Result  Value Ref Range   WBC 7.7 4.0 - 10.5 K/uL   RBC 4.26 3.87 - 5.11 MIL/uL   Hemoglobin 12.6 12.0 - 15.0 g/dL   HCT 38.6 36.0 - 46.0 %   MCV 90.6 78.0 - 100.0 fL   MCH 29.6 26.0 - 34.0 pg   MCHC 32.6 30.0 - 36.0 g/dL   RDW 12.9 11.5 - 15.5 %   Platelets 209 150 - 400 K/uL  CBG monitoring, ED     Status: Abnormal   Collection Time: 06/02/16  4:54 PM  Result Value Ref Range   Glucose-Capillary 127 (H) 65 - 99 mg/dL   Comment 1 Notify RN   Rapid urine drug screen (hospital performed)     Status: None   Collection Time: 06/02/16  5:31 PM  Result Value Ref Range   Opiates NONE DETECTED NONE DETECTED   Cocaine NONE DETECTED NONE DETECTED   Benzodiazepines NONE DETECTED NONE DETECTED   Amphetamines NONE DETECTED NONE DETECTED   Tetrahydrocannabinol NONE DETECTED NONE DETECTED   Barbiturates NONE DETECTED NONE DETECTED    Comment:        DRUG SCREEN FOR MEDICAL PURPOSES ONLY.  IF CONFIRMATION IS NEEDED FOR ANY PURPOSE, NOTIFY LAB WITHIN 5 DAYS.        LOWEST DETECTABLE LIMITS FOR URINE DRUG SCREEN Drug Class       Cutoff (ng/mL) Amphetamine      1000 Barbiturate      200 Benzodiazepine   503 Tricyclics       546 Opiates          300 Cocaine          300 THC              50   Acetaminophen level     Status: None   Collection Time: 06/02/16  7:55 PM  Result Value Ref Range   Acetaminophen (Tylenol), Serum 16 10 - 30 ug/mL    Comment:        THERAPEUTIC CONCENTRATIONS VARY SIGNIFICANTLY. A RANGE OF 10-30 ug/mL MAY BE AN EFFECTIVE CONCENTRATION FOR MANY PATIENTS. HOWEVER, SOME ARE BEST  TREATED AT CONCENTRATIONS OUTSIDE THIS RANGE. ACETAMINOPHEN CONCENTRATIONS >150 ug/mL AT 4 HOURS AFTER INGESTION AND >50 ug/mL AT 12 HOURS AFTER INGESTION ARE OFTEN ASSOCIATED WITH TOXIC REACTIONS.   CBG monitoring, ED     Status: Abnormal   Collection Time: 06/03/16  1:16 AM  Result Value Ref Range   Glucose-Capillary 156 (H) 65 - 99 mg/dL  CBG monitoring, ED     Status: Abnormal   Collection Time: 06/03/16  9:26 AM  Result Value Ref Range   Glucose-Capillary 187 (H) 65 - 99 mg/dL    Current Facility-Administered Medications  Medication Dose Route Frequency Provider Last Rate Last Dose  . hydrOXYzine (ATARAX/VISTARIL) tablet 25 mg  25 mg Oral TID PRN Rozetta Nunnery, NP      . traZODone (DESYREL) tablet 50 mg  50 mg Oral QHS,MR X 1 Rozetta Nunnery, NP       Current Outpatient Prescriptions  Medication Sig Dispense Refill  . Ascorbic Acid (VITAMIN C) 100 MG tablet Take 100 mg by mouth daily.    Marland Kitchen atorvastatin (LIPITOR) 10 MG tablet Take 5 mg by mouth every other day.     . Biotin 5000 MCG TABS Take 5,000 mcg by mouth daily.    Marland Kitchen buPROPion (WELLBUTRIN XL) 150 MG 24 hr tablet Take 150 mg by mouth every morning.  1  . cholecalciferol (VITAMIN  D) 1000 UNITS tablet Take 1,000 Units by mouth every other day.     . Cranberry 400 MG TABS Take 400 mg by mouth daily.    Marland Kitchen estrogen, conjugated,-medroxyprogesterone (PREMPRO) 0.3-1.5 MG tablet Take 1 tablet by mouth daily.    . fluticasone (FLOVENT HFA) 220 MCG/ACT inhaler Inhale 2 puffs into the lungs daily.    Javier Docker Oil 1000 MG CAPS Take 1,000 mg by mouth daily.    . Liraglutide (VICTOZA) 18 MG/3ML SOPN Inject 1.8 mg into the skin daily.    Marland Kitchen lisinopril (PRINIVIL,ZESTRIL) 10 MG tablet Take 10 mg by mouth daily.    Marland Kitchen MAGNESIUM PO Take 1 tablet by mouth daily.    . metFORMIN (GLUCOPHAGE) 1000 MG tablet Take 1 tablet (1,000 mg total) by mouth 2 (two) times daily. (Patient taking differently: Take 1,000 mg by mouth daily. ) 60 tablet 1  .  Multiple Vitamin (MULTIVITAMIN) capsule Take 1 capsule by mouth daily.    Marland Kitchen acetaminophen (TYLENOL) 500 MG tablet Take 2 tablets (1,000 mg total) by mouth every 6 (six) hours as needed. (Patient not taking: Reported on 06/03/2016) 30 tablet 0  . ondansetron (ZOFRAN ODT) 8 MG disintegrating tablet 56m ODT q4 hours prn nausea (Patient not taking: Reported on 06/03/2016) 5 tablet 0  . oxyCODONE-acetaminophen (PERCOCET) 5-325 MG tablet Take 1-2 tablets by mouth every 6 (six) hours as needed. (Patient not taking: Reported on 06/03/2016) 20 tablet 0  . penicillin v potassium (VEETID) 500 MG tablet Take 1 tablet (500 mg total) by mouth 3 (three) times daily. (Patient not taking: Reported on 06/03/2016) 30 tablet 0  . traMADol (ULTRAM) 50 MG tablet Take 1 tablet (50 mg total) by mouth every 6 (six) hours as needed (1-2 tablets every 6 hours with acetaminophen if needed for pain). (Patient not taking: Reported on 06/03/2016) 20 tablet 0    Musculoskeletal: Strength & Muscle Tone: within normal limits Gait & Station: normal Patient leans: N/A  Psychiatric Specialty Exam: Physical Exam  Neck: Normal range of motion.  Respiratory: Effort normal.  Musculoskeletal: Normal range of motion.    Review of Systems  Psychiatric/Behavioral: Positive for substance abuse and suicidal ideas. Depression: Stable. Hallucinations: Baseline.  All other systems reviewed and are negative.   Blood pressure 122/76, pulse 81, temperature 97.8 F (36.6 C), temperature source Oral, resp. rate 16, height _0  (1.6 m), weight 85.7 kg (189 lb), SpO2 100 %.Body mass index is 33.48 kg/m.  General Appearance: Fairly Groomed  Eye Contact:  Good  Speech:  Clear and Coherent and Normal Rate  Volume:  Normal  Mood:  Anxious  Affect:  Appropriate and Congruent  Thought Process:  Coherent and Goal Directed  Orientation:  Full (Time, Place, and Person)  Thought Content:  Logical  Suicidal Thoughts:  No  Homicidal Thoughts:  No  Memory:   Immediate;   Good Recent;   Good Remote;   Good  Judgement:  Intact  Insight:  Present  Psychomotor Activity:  Normal  Concentration:  Concentration: Good and Attention Span: Good  Recall:  Good  Fund of Knowledge:  Good  Language:  Good  Akathisia:  No  Handed:  Right  AIMS (if indicated):     Assets:  Communication Skills Desire for Improvement Financial Resources/Insurance Housing Intimacy Resilience Social Support Transportation  ADL's:  Intact  Cognition:  WNL  Sleep:        Treatment Plan Summary: Plan Discharge home to follow up with primary psych provider  Disposition: No evidence of imminent risk to self or others at present.   Patient does not meet criteria for psychiatric inpatient admission.   Discharge home Follow-up Information    [facility]. Go on 06/04/2016.   [facility]ty[facility]  [facility] information: [place] [address] [phone]            Earleen Newport, NP 06/03/2016 11:45 AM  Patient seen face-to-face for psychiatric evaluation, chart reviewed and case discussed with the physician extender and developed treatment plan. Reviewed the information documented and agree with the treatment plan. Corena Pilgrim, MD

## 2016-06-03 NOTE — Discharge Instructions (Signed)
For your ongoing mental health needs, you are advised to follow up with [facility].  New and returning patients are seen at their walk-in clinic.  Walk-in hours are Monday - Friday from 8:00 am - 3:00 pm.  Walk-in patients are seen on a first come, first served basis.  Try to arrive as early as possible for he best chance of being seen the same day: ° °     [facility] °     [address] °     [place], [place] [address] °     [phone] °

## 2016-06-03 NOTE — ED Notes (Addendum)
Pt received with PD for transport. Pt was not informed that she is under IVC, and would need to be seen by the MD in the morning. Once pt found out that she would be staying, she stated that she would not be complying with staff. Pt came out asking to use the phone and when she was informed that the phones are off till (AM pt went to the rest room, sat on the floor and became tearful and attempted to berricade self in restroom.

## 2016-06-03 NOTE — ED Notes (Signed)
Bed: [facility] Expected date:  Expected time:  Means of arrival:  Comments: marsh

## 2016-06-03 NOTE — [facility] Suicide Risk Assessment (Cosign Needed)
Suicide Risk Assessment  Discharge Assessment   [facility] Discharge Suicide Risk Assessment   Principal Problem: Adjustment disorder with depressed mood Discharge Diagnoses:  Patient Active Problem List   Diagnosis Date Noted  . Adjustment disorder with depressed mood [F43.21] 06/03/2016  . Drug overdose [T50.901A]   . Suicidal ideation [R45.851]   . Right ankle injury [S99.911A] 11/02/2010    Total Time spent with patient: 30 minutes  Musculoskeletal: Strength & Muscle Tone: within normal limits Gait & Station: normal Patient leans: N/A  Psychiatric Specialty Exam: Physical Exam  Neck: Normal range of motion.  Respiratory: Effort normal.  Musculoskeletal: Normal range of motion.    Review of Systems  Psychiatric/Behavioral: Positive for substance abuse and suicidal ideas. Depression: Stable. Hallucinations: Baseline.  All other systems reviewed and are negative.   Blood pressure 122/76, pulse 81, temperature 97.8 F (36.6 C), temperature source Oral, resp. rate 16, height 5\' 3"  (1.6 m), weight 85.7 kg (189 lb), SpO2 100 %.Body mass index is 33.48 kg/m.  General Appearance: Fairly Groomed  Eye Contact:  Good  Speech:  Clear and Coherent and Normal Rate  Volume:  Normal  Mood:  Anxious  Affect:  Appropriate and Congruent  Thought Process:  Coherent and Goal Directed  Orientation:  Full (Time, Place, and Person)  Thought Content:  Logical  Suicidal Thoughts:  No  Homicidal Thoughts:  No  Memory:  Immediate;   Good Recent;   Good Remote;   Good  Judgement:  Intact  Insight:  Present  Psychomotor Activity:  Normal  Concentration:  Concentration: Good and Attention Span: Good  Recall:  Good  Fund of Knowledge:  Good  Language:  Good  Akathisia:  No  Handed:  Right  AIMS (if indicated):     Assets:  Communication Skills Desire for Improvement Financial Resources/Insurance Housing Intimacy Resilience Social Support Transportation  ADL's:  Intact  Cognition:   WNL  Sleep:        Mental Status Per Nursing Assessment::   On Admission:     Demographic Factors:  Female  Loss Factors: None  Historical Factors: Impulsivity  Risk Reduction Factors:   Sense of responsibility to family, Employed, Living with another person, especially a relative, Positive social support, Positive therapeutic relationship and Positive coping skills or problem solving skills  Continued Clinical Symptoms:  Previous Psychiatric Diagnoses and Treatments  Cognitive Features That Contribute To Risk:  None    Suicide Risk:  Minimal: No identifiable suicidal ideation.  Patients presenting with no risk factors but with morbid ruminations; may be classified as minimal risk based on the severity of the depressive symptoms  Follow-up Information    [facility]. Go on 06/04/2016.   Specialty:  [facility] information: [address] [place] [address] [phone]           Plan Of Care/Follow-up recommendations:  Activity:  As tolerated Diet:  As tolerated   [place]uma Straus, NP 06/03/2016, 12:23 PM

## 2016-06-03 NOTE — Progress Notes (Signed)
Per psychiatrist request CSW contacted patient's husband Robley Fries [phone]) to inquire about any concerns regarding the patient. Patient's husband reported no concerns. Patient's husband reports that he feels comfortable with patient returning home and states "I don't feel like she will hurt herself or anyone else". Patient's husband reports that he will be home with the patient and that he is in route to the hospital. CSW thanked patient's husband for the information provided.

## 2016-06-03 NOTE — ED Notes (Signed)
Pt was able to deescilate self after short while and was able to have tearful conversation with Clinical research associate. Pt was made assurances by previous facility that are contradictory with current milieu. Pt expressed to write that she has had many extremely bad experiences in other inpatient facilities and requested to leave the door open. Pt educated as to rules of the milieu and how events may transpire in the AM but that she would be spending the night to see the specialist in the AM. Pt was able to comply with nursing assessment and denies depression, pain, HI / SI, Pt endorses that she has been dealing with the voices for "years" and that she is terrified of being in-patient again. Pt endorses that she is sad to be here but denies depression. Pt stated that she took the pills to sleep because the voices were becoming great, but that they are quiet now. Pt provided snack and drink and coloring pages because pt reports that she will not sleep here and has difficulty sleeping in strange places. Pt calm now and coloring. Will continue to assess.

## 2016-07-25 ENCOUNTER — Encounter (HOSPITAL_BASED_OUTPATIENT_CLINIC_OR_DEPARTMENT_OTHER): Payer: Self-pay

## 2016-07-25 ENCOUNTER — Emergency Department (HOSPITAL_BASED_OUTPATIENT_CLINIC_OR_DEPARTMENT_OTHER): Payer: BLUE CROSS/BLUE SHIELD

## 2016-07-25 DIAGNOSIS — Z87891 Personal history of nicotine dependence: Secondary | ICD-10-CM | POA: Insufficient documentation

## 2016-07-25 DIAGNOSIS — E119 Type 2 diabetes mellitus without complications: Secondary | ICD-10-CM | POA: Diagnosis not present

## 2016-07-25 DIAGNOSIS — J4521 Mild intermittent asthma with (acute) exacerbation: Secondary | ICD-10-CM | POA: Diagnosis not present

## 2016-07-25 DIAGNOSIS — R05 Cough: Secondary | ICD-10-CM | POA: Diagnosis present

## 2016-07-25 DIAGNOSIS — Z7984 Long term (current) use of oral hypoglycemic drugs: Secondary | ICD-10-CM | POA: Diagnosis not present

## 2016-07-25 MED ORDER — IPRATROPIUM BROMIDE 0.02 % IN SOLN
RESPIRATORY_TRACT | Status: AC
  Administered 2016-07-25: 0.5 mg via RESPIRATORY_TRACT
  Filled 2016-07-25: qty 2.5

## 2016-07-25 MED ORDER — ALBUTEROL SULFATE (2.5 MG/3ML) 0.083% IN NEBU
5.0000 mg | INHALATION_SOLUTION | RESPIRATORY_TRACT | Status: AC
  Administered 2016-07-25: 5 mg via RESPIRATORY_TRACT

## 2016-07-25 MED ORDER — ALBUTEROL SULFATE (2.5 MG/3ML) 0.083% IN NEBU
INHALATION_SOLUTION | RESPIRATORY_TRACT | Status: AC
  Administered 2016-07-25: 5 mg via RESPIRATORY_TRACT
  Filled 2016-07-25: qty 6

## 2016-07-25 MED ORDER — IPRATROPIUM BROMIDE 0.02 % IN SOLN
0.5000 mg | RESPIRATORY_TRACT | Status: AC
  Administered 2016-07-25: 0.5 mg via RESPIRATORY_TRACT

## 2016-07-25 NOTE — ED Triage Notes (Signed)
c/o flu like sx x 8 days-DOE and when lying down-last albuterol inhaler 1 hour PTA-RT in for assessment

## 2016-07-26 ENCOUNTER — Emergency Department (HOSPITAL_BASED_OUTPATIENT_CLINIC_OR_DEPARTMENT_OTHER)
Admission: EM | Admit: 2016-07-26 | Discharge: 2016-07-26 | Disposition: A | Discharge status: Home / Self Care | Payer: BLUE CROSS/BLUE SHIELD | Attending: Emergency Medicine | Admitting: Emergency Medicine

## 2016-07-26 DIAGNOSIS — J4521 Mild intermittent asthma with (acute) exacerbation: Secondary | ICD-10-CM

## 2016-07-26 MED ORDER — IPRATROPIUM BROMIDE 0.02 % IN SOLN
0.5000 mg | Freq: Once | RESPIRATORY_TRACT | Status: DC
  Filled 2016-07-26: qty 2.5

## 2016-07-26 MED ORDER — ALBUTEROL SULFATE (2.5 MG/3ML) 0.083% IN NEBU
5.0000 mg | INHALATION_SOLUTION | RESPIRATORY_TRACT | Status: AC
  Administered 2016-07-26: 5 mg via RESPIRATORY_TRACT

## 2016-07-26 MED ORDER — PREDNISONE 20 MG PO TABS: 60.0000 mg | ORAL_TABLET | Freq: Every day | ORAL | 0 refills | Status: DC

## 2016-07-26 MED ORDER — IPRATROPIUM BROMIDE 0.02 % IN SOLN
0.5000 mg | RESPIRATORY_TRACT | Status: AC
  Administered 2016-07-26: 0.5 mg via RESPIRATORY_TRACT

## 2016-07-26 MED ORDER — ALBUTEROL SULFATE (2.5 MG/3ML) 0.083% IN NEBU
5.0000 mg | INHALATION_SOLUTION | Freq: Once | RESPIRATORY_TRACT | Status: DC
  Filled 2016-07-26: qty 6

## 2016-07-26 MED ORDER — PREDNISONE 50 MG PO TABS
60.0000 mg | ORAL_TABLET | Freq: Once | ORAL | Status: AC
  Administered 2016-07-26: 60 mg via ORAL
  Filled 2016-07-26: qty 1

## 2016-07-26 MED ORDER — BENZONATATE 100 MG PO CAPS
200.0000 mg | ORAL_CAPSULE | Freq: Once | ORAL | Status: AC
  Administered 2016-07-26: 200 mg via ORAL
  Filled 2016-07-26: qty 2

## 2016-07-26 MED ORDER — BENZONATATE 100 MG PO CAPS: 100.0000 mg | ORAL_CAPSULE | Freq: Three times a day (TID) | ORAL | 0 refills | Status: DC | PRN

## 2016-07-26 NOTE — ED Provider Notes (Signed)
[facility] Provider Note   CSN: [id number] Arrival date & time: 07/25/16  2249     History   Chief Complaint Chief Complaint  Patient presents with  . Cough    HPI Crystal Longstreet is a 51 y.o. female with a past medical history of asthma presenting today with shortness of breath. She states is going on for the past several days and acutely got worse today. She has been using her albuterol inhaler without any relief. She describes a nonproductive cough, congestion, rhinorrhea, and wheezing. When she lays down at night she feels like she cannot breathe at all. No fevers or chills. No chest pain. There are no further complaints.  10 Systems reviewed and are negative for acute change except as noted in the HPI.   HPI  Past Medical History:  Diagnosis Date  . Ankle fracture, right   . Asthma   . Depression   . Diabetes mellitus   . GERD (gastroesophageal reflux disease)   . Schizoaffective disorder [facility])     Patient Active Problem List   Diagnosis Date Noted  . Adjustment disorder with depressed mood 06/03/2016  . Drug overdose   . Suicidal ideation   . Right ankle injury 11/02/2010    Past Surgical History:  Procedure Laterality Date  . BACK SURGERY    . ENDOMETRIAL ABLATION W/ NOVASURE    . FOOT FASCIOTOMY    . HAMMER TOE SURGERY    . KNEE ARTHROCENTESIS    . LIPOMA EXCISION    . lipoma removed      OB History    No data available       Home Medications    Prior to Admission medications   Medication Sig Start Date End Date Taking? Authorizing Provider  acetaminophen (TYLENOL) 500 MG tablet Take 2 tablets (1,000 mg total) by mouth every 6 (six) hours as needed. Patient not taking: Reported on 06/03/2016 12/04/15   Arby Barrette, MD  Ascorbic Acid (VITAMIN C) 100 MG tablet Take 100 mg by mouth daily.    Historical Provider, MD  atorvastatin (LIPITOR) 10 MG tablet Take 5 mg by mouth every other day.     Historical Provider, MD  Biotin 5000 MCG TABS  Take 5,000 mcg by mouth daily.    Historical Provider, MD  buPROPion (WELLBUTRIN XL) 150 MG 24 hr tablet Take 150 mg by mouth every morning. 05/04/16   Historical Provider, MD  cholecalciferol (VITAMIN D) 1000 UNITS tablet Take 1,000 Units by mouth every other day.     Historical Provider, MD  Cranberry 400 MG TABS Take 400 mg by mouth daily.    Historical Provider, MD  estrogen, conjugated,-medroxyprogesterone (PREMPRO) 0.3-1.5 MG tablet Take 1 tablet by mouth daily. 12/03/14   Historical Provider, MD  fluticasone (FLOVENT HFA) 220 MCG/ACT inhaler Inhale 2 puffs into the lungs daily.    Historical Provider, MD  hydrOXYzine (ATARAX/VISTARIL) 25 MG tablet Take 1 tablet (25 mg total) by mouth 3 (three) times daily as needed for anxiety. 06/03/16   Shuvon B Rankin, NP  Krill Oil 1000 MG CAPS Take 1,000 mg by mouth daily.    Historical Provider, MD  Liraglutide (VICTOZA) 18 MG/3ML SOPN Inject 1.8 mg into the skin daily.    Historical Provider, MD  lisinopril (PRINIVIL,ZESTRIL) 10 MG tablet Take 10 mg by mouth daily. 02/27/16 02/26/17  Historical Provider, MD  MAGNESIUM PO Take 1 tablet by mouth daily.    Historical Provider, MD  metFORMIN (GLUCOPHAGE) 1000 MG tablet  Take 1 tablet (1,000 mg total) by mouth 2 (two) times daily. Patient taking differently: Take 1,000 mg by mouth daily.  09/22/12   [name], MD  Multiple Vitamin (MULTIVITAMIN) capsule Take 1 capsule by mouth daily.    Historical Provider, MD  ondansetron (ZOFRAN ODT) 8 MG disintegrating tablet 8mg  ODT q4 hours prn nausea Patient not taking: Reported on 06/03/2016 03/14/15   [name], MD  oxyCODONE-acetaminophen (PERCOCET) 5-325 MG tablet Take 1-2 tablets by mouth every 6 (six) hours as needed. Patient not taking: Reported on 06/03/2016 05/02/16   [name], MD  penicillin v potassium (VEETID) 500 MG tablet Take 1 tablet (500 mg total) by mouth 3 (three) times daily. Patient not taking: Reported on 06/03/2016 05/02/16   [name], MD    traMADol (ULTRAM) 50 MG tablet Take 1 tablet (50 mg total) by mouth every 6 (six) hours as needed (1-2 tablets every 6 hours with acetaminophen if needed for pain). Patient not taking: Reported on 06/03/2016 12/04/15   Arby Barrette, MD  traZODone (DESYREL) 50 MG tablet Take 1 tablet (50 mg total) by mouth at bedtime and may repeat dose one time if needed. 06/03/16   Talmage Nap, NP    Family History Family History  Problem Relation Age of Onset  . Stroke Mother   . Diabetes Mother   . Hypertension Mother   . Stroke Father   . Sudden death Father   . Hypertension Father   . Hyperlipidemia Father   . Heart attack Father   . Diabetes Father   . Diabetes Maternal Grandmother   . Heart attack Maternal Grandmother   . Hypertension Maternal Grandmother   . Diabetes Paternal Grandmother   . Sudden death Paternal Grandmother     Social History Social History  Substance Use Topics  . Smoking status: Former Games developer  . Smokeless tobacco: Never Used  . Alcohol use No     Allergies   Patient has no known allergies.   Review of Systems Review of Systems   Physical Exam Updated Vital Signs BP (!) 153/103 (BP Location: Right Arm)   Pulse 85   Temp 98.6 F (37 C) (Oral)   Resp (!) 24   Ht 5\' 3"  (1.6 m)   Wt 192 lb (87.1 kg)   SpO2 100%   BMI 34.01 kg/m   Physical Exam  Constitutional: She is oriented to person, place, and time. She appears well-developed and well-nourished. No distress.  HENT:  Head: Normocephalic and atraumatic.  Nose: Nose normal.  Mouth/Throat: Oropharynx is clear and moist. No oropharyngeal exudate.  Eyes: Conjunctivae and EOM are normal. Pupils are equal, round, and reactive to light. No scleral icterus.  Neck: Normal range of motion. Neck supple. No JVD present. No tracheal deviation present. No thyromegaly present.  Cardiovascular: Normal rate, regular rhythm and normal heart sounds.  Exam reveals no gallop and no friction rub.   No murmur  heard. Pulmonary/Chest: Effort normal. No respiratory distress. She has wheezes. She exhibits no tenderness.  Abdominal: Soft. Bowel sounds are normal. She exhibits no distension and no mass. There is no tenderness. There is no rebound and no guarding.  Musculoskeletal: Normal range of motion. She exhibits no edema or tenderness.  Lymphadenopathy:    She has no cervical adenopathy.  Neurological: She is alert and oriented to person, place, and time. No cranial nerve deficit. She exhibits normal muscle tone.  Skin: Skin is warm and dry. No rash noted. No erythema. No pallor.  Nursing note and vitals reviewed.    ED Treatments / Results  Labs (all labs ordered are listed, but only abnormal results are displayed) Labs Reviewed - No data to display  EKG  EKG Interpretation None       Radiology Dg Chest 2 View  Result Date: 07/25/2016 CLINICAL DATA:  Cough.  Flu like symptoms for 8 days. EXAM: CHEST  2 VIEW COMPARISON:  None. FINDINGS: Stable normal cardiac silhouette. Prominent pulmonary markings in the lung bases probably represents bronchitic changes. No focal consolidation, effusion, or pneumothorax. Bones are unremarkable. IMPRESSION: Bronchitic changes in the lung bases. No focal consolidation, effusion, or pneumothorax. Electronically Signed   By: Mitzi Hansen M.D.   On: 07/25/2016 23:51    Procedures Procedures (including critical care time)  Medications Ordered in ED Medications  albuterol (PROVENTIL) (2.5 MG/3ML) 0.083% nebulizer solution 5 mg (5 mg Nebulization Given 07/25/16 2302)  ipratropium (ATROVENT) nebulizer solution 0.5 mg (0.5 mg Nebulization Given 07/25/16 2301)  predniSONE (DELTASONE) tablet 60 mg (60 mg Oral Given 07/26/16 0106)  benzonatate (TESSALON) capsule 200 mg (200 mg Oral Given 07/26/16 0106)  albuterol (PROVENTIL) (2.5 MG/3ML) 0.083% nebulizer solution 5 mg (5 mg Nebulization Given 07/26/16 0115)  ipratropium (ATROVENT) nebulizer solution 0.5  mg (0.5 mg Nebulization Given 07/26/16 0115)     Initial Impression / Assessment and Plan / ED Course  I have reviewed the triage vital signs and the nursing notes.  Pertinent labs & imaging results that were available during my care of the patient were reviewed by me and considered in my medical decision making (see chart for details).     Patient presents emergency department for respiratory symptoms. Chest x-rays negative for pneumonia. She is still wheezing on exam. She was ordered albuterol, ipratropium, prednisone, and Tessalon Perles.  1:56 AM a repeat evaluation, wheezing has resolved, patient states she can breathe much better now. We'll discharge home with prednisone for 4 more days. Primary care follow-up advised in 3 days for close follow-up. She appears well in acute distress, vital signs are within her normal limits. Patient is safe for discharge.  Final Clinical Impressions(s) / ED Diagnoses   Final diagnoses:  None    New Prescriptions New Prescriptions   No medications on file     Tomasita Crumble, MD 07/26/16 [phone]

## 2016-07-26 NOTE — ED Notes (Signed)
ED Provider at bedside. 

## 2016-09-12 ENCOUNTER — Emergency Department (HOSPITAL_BASED_OUTPATIENT_CLINIC_OR_DEPARTMENT_OTHER)
Admission: EM | Admit: 2016-09-12 | Discharge: 2016-09-12 | Disposition: A | Discharge status: Home / Self Care | Payer: BLUE CROSS/BLUE SHIELD | Attending: Emergency Medicine | Admitting: Emergency Medicine

## 2016-09-12 ENCOUNTER — Encounter (HOSPITAL_BASED_OUTPATIENT_CLINIC_OR_DEPARTMENT_OTHER): Payer: Self-pay | Admitting: Emergency Medicine

## 2016-09-12 DIAGNOSIS — K029 Dental caries, unspecified: Secondary | ICD-10-CM

## 2016-09-12 DIAGNOSIS — Z87891 Personal history of nicotine dependence: Secondary | ICD-10-CM | POA: Insufficient documentation

## 2016-09-12 DIAGNOSIS — A084 Viral intestinal infection, unspecified: Secondary | ICD-10-CM | POA: Insufficient documentation

## 2016-09-12 DIAGNOSIS — Y999 Unspecified external cause status: Secondary | ICD-10-CM | POA: Insufficient documentation

## 2016-09-12 DIAGNOSIS — W5503XA Scratched by cat, initial encounter: Secondary | ICD-10-CM | POA: Insufficient documentation

## 2016-09-12 DIAGNOSIS — E119 Type 2 diabetes mellitus without complications: Secondary | ICD-10-CM | POA: Diagnosis not present

## 2016-09-12 DIAGNOSIS — Z7984 Long term (current) use of oral hypoglycemic drugs: Secondary | ICD-10-CM | POA: Insufficient documentation

## 2016-09-12 DIAGNOSIS — Y939 Activity, unspecified: Secondary | ICD-10-CM | POA: Insufficient documentation

## 2016-09-12 DIAGNOSIS — Y929 Unspecified place or not applicable: Secondary | ICD-10-CM | POA: Diagnosis not present

## 2016-09-12 DIAGNOSIS — S80811A Abrasion, right lower leg, initial encounter: Secondary | ICD-10-CM

## 2016-09-12 DIAGNOSIS — J45909 Unspecified asthma, uncomplicated: Secondary | ICD-10-CM | POA: Diagnosis not present

## 2016-09-12 DIAGNOSIS — S8991XA Unspecified injury of right lower leg, initial encounter: Secondary | ICD-10-CM | POA: Diagnosis present

## 2016-09-12 MED ORDER — ONDANSETRON HCL 4 MG/2ML IJ SOLN
4.0000 mg | Freq: Once | INTRAMUSCULAR | Status: AC
  Administered 2016-09-12: 4 mg via INTRAVENOUS
  Filled 2016-09-12: qty 2

## 2016-09-12 MED ORDER — ONDANSETRON 8 MG PO TBDP: 8.0000 mg | ORAL_TABLET | Freq: Three times a day (TID) | ORAL | 0 refills | Status: DC | PRN

## 2016-09-12 MED ORDER — BUPIVACAINE-EPINEPHRINE (PF) 0.5% -1:200000 IJ SOLN
1.8000 mL | Freq: Once | INTRAMUSCULAR | Status: AC
  Administered 2016-09-12: 1.8 mL
  Filled 2016-09-12: qty 1.8

## 2016-09-12 MED ORDER — PENICILLIN V POTASSIUM 500 MG PO TABS: 500.0000 mg | ORAL_TABLET | Freq: Four times a day (QID) | ORAL | 0 refills | Status: DC

## 2016-09-12 MED ORDER — SODIUM CHLORIDE 0.9 % IV BOLUS (SEPSIS)
1000.0000 mL | Freq: Once | INTRAVENOUS | Status: AC
  Administered 2016-09-12: 1000 mL via INTRAVENOUS

## 2016-09-12 NOTE — ED Triage Notes (Signed)
Vomiting Monday night until Tuesday afternoon.  Ate meal at 9pm (5 hrs ago) and has not vomited since.  Fever up to 100.1 today. Pt sts her cat scratched her 3 days ago on the right upper leg and now she has small area of swelling adjacent to right kneecap. Also has toothache.

## 2016-09-12 NOTE — ED Provider Notes (Signed)
[facility] Provider Note: [name], MD, FACEP  CSN: [id number] MRN: [id number] ARRIVAL: 09/12/16 at 0142 ROOM: MH01/MH01   CHIEF COMPLAINT  Vomiting   HISTORY OF PRESENT ILLNESS  Karen Patrick is a 51 y.o. female with a 2 day history of nausea, vomiting and diarrhea. Her vomiting improved yesterday afternoon and she was able to eat but her nausea has returned. She has a carious left lower premolar which is causing her 6 out of 10 pain and she is wondering if this may be causing her acute GI illness. She also has some superficial scratches to her right thigh caused by her cat about 3 days ago. She has had a fever to 100.1 per nursing notes but 102 per her statement to me.   Past Medical History:  Diagnosis Date  . Ankle fracture, right   . Asthma   . Depression   . Diabetes mellitus   . GERD (gastroesophageal reflux disease)   . Schizoaffective disorder [facility])     Past Surgical History:  Procedure Laterality Date  . BACK SURGERY    . ENDOMETRIAL ABLATION W/ NOVASURE    . FOOT FASCIOTOMY    . HAMMER TOE SURGERY    . KNEE ARTHROCENTESIS    . LIPOMA EXCISION    . lipoma removed      Family History  Problem Relation Age of Onset  . Stroke Mother   . Diabetes Mother   . Hypertension Mother   . Stroke Father   . Sudden death Father   . Hypertension Father   . Hyperlipidemia Father   . Heart attack Father   . Diabetes Father   . Diabetes Maternal Grandmother   . Heart attack Maternal Grandmother   . Hypertension Maternal Grandmother   . Diabetes Paternal Grandmother   . Sudden death Paternal Grandmother     Social History  Substance Use Topics  . Smoking status: Former Games developer  . Smokeless tobacco: Never Used  . Alcohol use No    Prior to Admission medications   Medication Sig Start Date End Date Taking? Authorizing Provider  Ascorbic Acid (VITAMIN C) 100 MG tablet Take 100 mg by mouth daily.    [provider]  benzonatate  (TESSALON) 100 MG capsule Take 1 capsule (100 mg total) by mouth 3 (three) times daily as needed for cough. 07/26/16   Tomasita Crumble, MD  Biotin 5000 MCG TABS Take 5,000 mcg by mouth daily.    [provider]  buPROPion (WELLBUTRIN XL) 150 MG 24 hr tablet Take 150 mg by mouth every morning. 05/04/16   [provider]  cholecalciferol (VITAMIN D) 1000 UNITS tablet Take 1,000 Units by mouth every other day.     [provider]  Cranberry 400 MG TABS Take 400 mg by mouth daily.    [provider]  estrogen, conjugated,-medroxyprogesterone (PREMPRO) 0.3-1.5 MG tablet Take 1 tablet by mouth daily. 12/03/14   [provider]  fluticasone (FLOVENT HFA) 220 MCG/ACT inhaler Inhale 2 puffs into the lungs daily.    [provider]  Boris Lown Oil 1000 MG CAPS Take 1,000 mg by mouth daily.    [provider]  Liraglutide (VICTOZA) 18 MG/3ML SOPN Inject 1.8 mg into the skin daily.    [provider]  MAGNESIUM PO Take 1 tablet by mouth daily.    [provider]  metFORMIN (GLUCOPHAGE) 1000 MG tablet Take 1 tablet (1,000 mg total) by mouth 2 (two) times daily. Patient taking differently:  Take 1,000 mg by mouth daily.  09/22/12   [name], MD  Multiple Vitamin (MULTIVITAMIN) capsule Take 1 capsule by mouth daily.    [provider]  ondansetron (ZOFRAN ODT) 8 MG disintegrating tablet Take 1 tablet (8 mg total) by mouth every 8 (eight) hours as needed for nausea or vomiting. 09/12/16   Flynn Lininger, MD  penicillin v potassium (VEETID) 500 MG tablet Take 1 tablet (500 mg total) by mouth 4 (four) times daily. 09/12/16   Adalbert Alberto, Jonny Ruiz, MD    Allergies Patient has no known allergies.   REVIEW OF SYSTEMS  Negative except as noted here or in the History of Present Illness.   PHYSICAL EXAMINATION  Initial Vital Signs Blood pressure (!) 164/94, pulse 81, temperature 98.2 F (36.8 C), temperature source Oral, resp. rate 16, height 5'  3" (1.6 m), weight 190 lb (86.2 kg), SpO2 99 %.  Examination General: Well-developed, well-nourished female in no acute distress; appearance consistent with age of record HENT: normocephalic; atraumatic; left lower second premolar carious to gumline with tenderness to percussion Eyes: pupils equal, round and reactive to light; extraocular muscles intact Neck: supple Heart: regular rate and rhythm Lungs: clear to auscultation bilaterally Abdomen: soft; nondistended; nontender; no masses or hepatosplenomegaly; bowel sounds present Extremities: No deformity; full range of motion; pulses normal Neurologic: Awake, alert and oriented; motor function intact in all extremities and symmetric; no facial droop Skin: Warm and dry; a few superficial linear scratches to the right anterior thigh without signs of infection Psychiatric: Normal mood and affect   RESULTS  Summary of this visit's results, reviewed by myself:   EKG Interpretation  Date/Time:    Ventricular Rate:    PR Interval:    QRS Duration:   QT Interval:    QTC Calculation:   R Axis:     Text Interpretation:        Laboratory Studies: No results found for this or any previous visit (from the past 24 hour(s)). Imaging Studies: No results found.  ED COURSE  Nursing notes and initial vitals signs, including pulse oximetry, reviewed.  Vitals:   09/12/16 0151 09/12/16 0153  BP: (!) 164/94   Pulse: 81   Resp: 16   Temp: 98.2 F (36.8 C)   TempSrc: Oral   SpO2: 99%   Weight:  190 lb (86.2 kg)  Height:  5\' 3"  (1.6 m)   5:00 AM Patient drinking fluids without emesis. Feeling better after dental block. Will refer to oral surgery and start on penicillin.  PROCEDURES   DENTAL BLOCK 1.8 milliliters of 0.5% bupivacaine with epinephrine were injected into the buccal fold adjacent to the left lower second premolar. The patient tolerated this well and there were no immediate complications. Adequate analgesia was  obtained.   ED DIAGNOSES     ICD-9-CM ICD-10-CM   1. Viral gastroenteritis 008.8 A08.4   2. Pain due to dental caries 521.00 K02.9   3. Cat scratch of right lower leg, initial encounter 916.0 S80.811A    E906.8 W55.Marchia Bond, MD 09/12/16 [phone]

## 2017-03-05 DIAGNOSIS — I10 Essential (primary) hypertension: Secondary | ICD-10-CM

## 2017-03-05 DIAGNOSIS — E119 Type 2 diabetes mellitus without complications: Secondary | ICD-10-CM | POA: Insufficient documentation

## 2017-03-05 DIAGNOSIS — Z8679 Personal history of other diseases of the circulatory system: Secondary | ICD-10-CM

## 2017-03-05 HISTORY — DX: Personal history of other diseases of the circulatory system: Z86.79

## 2017-03-05 HISTORY — DX: Essential (primary) hypertension: I10

## 2017-03-05 HISTORY — DX: Type 2 diabetes mellitus without complications: E11.9

## 2017-03-06 DIAGNOSIS — M546 Pain in thoracic spine: Secondary | ICD-10-CM

## 2017-03-06 HISTORY — DX: Pain in thoracic spine: M54.6

## 2017-04-19 ENCOUNTER — Emergency Department (HOSPITAL_BASED_OUTPATIENT_CLINIC_OR_DEPARTMENT_OTHER)
Admission: EM | Admit: 2017-04-19 | Discharge: 2017-04-19 | Disposition: A | Discharge status: Home / Self Care | Payer: BLUE CROSS/BLUE SHIELD | Attending: Emergency Medicine | Admitting: Emergency Medicine

## 2017-04-19 ENCOUNTER — Encounter (HOSPITAL_BASED_OUTPATIENT_CLINIC_OR_DEPARTMENT_OTHER): Payer: Self-pay

## 2017-04-19 ENCOUNTER — Other Ambulatory Visit: Payer: Self-pay

## 2017-04-19 DIAGNOSIS — J45909 Unspecified asthma, uncomplicated: Secondary | ICD-10-CM | POA: Diagnosis not present

## 2017-04-19 DIAGNOSIS — Z7984 Long term (current) use of oral hypoglycemic drugs: Secondary | ICD-10-CM | POA: Insufficient documentation

## 2017-04-19 DIAGNOSIS — R300 Dysuria: Secondary | ICD-10-CM | POA: Diagnosis present

## 2017-04-19 DIAGNOSIS — E119 Type 2 diabetes mellitus without complications: Secondary | ICD-10-CM | POA: Diagnosis not present

## 2017-04-19 DIAGNOSIS — N10 Acute tubulo-interstitial nephritis: Secondary | ICD-10-CM

## 2017-04-19 DIAGNOSIS — Z87891 Personal history of nicotine dependence: Secondary | ICD-10-CM | POA: Diagnosis not present

## 2017-04-19 DIAGNOSIS — Z79899 Other long term (current) drug therapy: Secondary | ICD-10-CM | POA: Diagnosis not present

## 2017-04-19 LAB — URINALYSIS, ROUTINE W REFLEX MICROSCOPIC
Bilirubin Urine: NEGATIVE
HGB URINE DIPSTICK: NEGATIVE
KETONES UR: 15 mg/dL — AB
LEUKOCYTES UA: NEGATIVE
Nitrite: POSITIVE — AB
PH: 6 (ref 5.0–8.0)
Protein, ur: NEGATIVE mg/dL
Specific Gravity, Urine: 1.02 (ref 1.005–1.030)

## 2017-04-19 LAB — URINALYSIS, MICROSCOPIC (REFLEX): RBC / HPF: NONE SEEN RBC/hpf (ref 0–5)

## 2017-04-19 LAB — PREGNANCY, URINE: Preg Test, Ur: NEGATIVE

## 2017-04-19 MED ORDER — PHENAZOPYRIDINE HCL 200 MG PO TABS: 200.0000 mg | ORAL_TABLET | Freq: Three times a day (TID) | ORAL | 0 refills | Status: DC

## 2017-04-19 MED ORDER — CEPHALEXIN 500 MG PO CAPS: 500.0000 mg | ORAL_CAPSULE | Freq: Three times a day (TID) | ORAL | 0 refills | Status: AC

## 2017-04-19 NOTE — ED Provider Notes (Signed)
[facility] EMERGENCY DEPARTMENT Provider Note   CSN: [id number] Arrival date & time: 04/19/17  2057     History   Chief Complaint Chief Complaint  Patient presents with  . Dysuria    HPI Karen Patrick is a 51 y.o. female.  HPI   51 year old female with a history of diabetes, depression, schizoaffective disorder, presents with concern for dysuria and suprapubic pain for 6 days.  Reports dysuria dysuria, pubic pain, urgency.  Reports that over the last day or 2, she is also developed some left flank pain.  Reports left leg pain is mild to moderate, approximately 4 out of 10.  Reports that the suprapubic pain is more severe, and worsened with urination.  Denies nausea, vomiting, fevers.  Reports that over the last 4 weeks since she was in the hospital, she has had diarrhea, approximately 4 episodes per day.  She is yet seen anyone regarding this.  Denies any recent antibiotic use.  Past Medical History:  Diagnosis Date  . Ankle fracture, right   . Asthma   . Depression   . Diabetes mellitus   . GERD (gastroesophageal reflux disease)   . Schizoaffective disorder [facility](HCC)     Patient Active Problem List   Diagnosis Date Noted  . Adjustment disorder with depressed mood 06/03/2016  . Drug overdose   . Suicidal ideation   . Right ankle injury 11/02/2010    Past Surgical History:  Procedure Laterality Date  . BACK SURGERY    . ENDOMETRIAL ABLATION W/ NOVASURE    . FOOT FASCIOTOMY    . HAMMER TOE SURGERY    . KNEE ARTHROCENTESIS    . LIPOMA EXCISION    . lipoma removed      OB History    No data available       Home Medications    Prior to Admission medications   Medication Sig Start Date End Date Taking? Authorizing Provider  Amphetamine-Dextroamphetamine (ADDERALL PO) Take by mouth.   Yes [provider]  CARVEDILOL PO Take by mouth.   Yes [provider]  LOSARTAN POTASSIUM PO Take by mouth.   Yes [provider]  Ascorbic  Acid (VITAMIN C) 100 MG tablet Take 100 mg by mouth daily.    [provider]  Biotin 5000 MCG TABS Take 5,000 mcg by mouth daily.    [provider]  cephALEXin (KEFLEX) 500 MG capsule Take 1 capsule (500 mg total) by mouth 3 (three) times daily for 10 days. 04/19/17 04/29/17  Alvira MondaySchlossman, Joliyah Lippens, MD  cholecalciferol (VITAMIN D) 1000 UNITS tablet Take 1,000 Units by mouth every other day.     [provider]  Cranberry 400 MG TABS Take 400 mg by mouth daily.    [provider]  estrogen, conjugated,-medroxyprogesterone (PREMPRO) 0.3-1.5 MG tablet Take 1 tablet by mouth daily. 12/03/14   [provider]  fluticasone (FLOVENT HFA) 220 MCG/ACT inhaler Inhale 2 puffs into the lungs daily.    [provider]  Boris LownKrill Oil 1000 MG CAPS Take 1,000 mg by mouth daily.    [provider]  Liraglutide (VICTOZA) 18 MG/3ML SOPN Inject 1.8 mg into the skin daily.    [provider]  MAGNESIUM PO Take 1 tablet by mouth daily.    [provider]  metFORMIN (GLUCOPHAGE) 1000 MG tablet Take 1 tablet (1,000 mg total) by mouth 2 (two) times daily. Patient taking differently: Take 1,000 mg by mouth daily.  09/22/12   Geoffery Lyonselo, Douglas, MD  Multiple Vitamin (MULTIVITAMIN) capsule Take 1 capsule by mouth daily.    [provider]  phenazopyridine (PYRIDIUM) 200 MG tablet Take 1 tablet (200 mg total) by mouth 3 (three) times daily. 04/19/17   Alvira Monday, MD    Family History Family History  Problem Relation Age of Onset  . Stroke Mother   . Diabetes Mother   . Hypertension Mother   . Stroke Father   . Sudden death Father   . Hypertension Father   . Hyperlipidemia Father   . Heart attack Father   . Diabetes Father   . Diabetes Maternal Grandmother   . Heart attack Maternal Grandmother   . Hypertension Maternal Grandmother   . Diabetes Paternal Grandmother   . Sudden death Paternal Grandmother     Social History Social  History   Tobacco Use  . Smoking status: Former Games developer  . Smokeless tobacco: Never Used  Substance Use Topics  . Alcohol use: No  . Drug use: No     Allergies   Patient has no known allergies.   Review of Systems Review of Systems  Constitutional: Negative for fever.  HENT: Negative for sore throat.   Eyes: Negative for visual disturbance.  Respiratory: Negative for cough and shortness of breath.   Cardiovascular: Negative for chest pain.  Gastrointestinal: Positive for abdominal pain. Negative for diarrhea, nausea and vomiting.  Genitourinary: Positive for dysuria and flank pain. Negative for difficulty urinating.  Musculoskeletal: Negative for back pain and neck pain.  Skin: Negative for rash.  Neurological: Negative for syncope and headaches.     Physical Exam Updated Vital Signs BP (!) 147/82 (BP Location: Left Arm)   Pulse 82   Temp 98 F (36.7 C) (Oral)   Resp 18   SpO2 100%   Physical Exam  Constitutional: She is oriented to person, place, and time. She appears well-developed and well-nourished. No distress.  HENT:  Head: Normocephalic and atraumatic.  Eyes: Conjunctivae and EOM are normal.  Neck: Normal range of motion.  Cardiovascular: Normal rate, regular rhythm, normal heart sounds and intact distal pulses. Exam reveals no gallop and no friction rub.  No murmur heard. Pulmonary/Chest: Effort normal and breath sounds normal. No respiratory distress. She has no wheezes. She has no rales.  Abdominal: Soft. She exhibits no distension. There is tenderness (suprapubic ). There is no guarding.  Left cva tenderness   Musculoskeletal: She exhibits no edema or tenderness.  Neurological: She is alert and oriented to person, place, and time.  Skin: Skin is warm and dry. No rash noted. She is not diaphoretic. No erythema.  Nursing note and vitals reviewed.    ED Treatments / Results  Labs (all labs ordered are listed, but only abnormal results are  displayed) Labs Reviewed  URINALYSIS, ROUTINE W REFLEX MICROSCOPIC - Abnormal; Notable for the following components:      Result Value   Glucose, UA >=500 (*)    Ketones, ur 15 (*)    Nitrite POSITIVE (*)    All other components within normal limits  URINALYSIS, MICROSCOPIC (REFLEX) - Abnormal; Notable for the following components:   Bacteria, UA MANY (*)    Squamous Epithelial / LPF 0-5 (*)    All other components within normal limits  URINE CULTURE  PREGNANCY, URINE    EKG  EKG Interpretation None       Radiology No results found.  Procedures Procedures (including critical care time)  Medications Ordered in ED Medications - No data to display  Initial Impression / Assessment and Plan / ED Course  I have reviewed the triage vital signs and the nursing notes.  Pertinent labs & imaging results that were available during my care of the patient were reviewed by me and considered in my medical decision making (see chart for details).     51 year old female with a history of diabetes, depression, schizoaffective disorder, presents with concern for dysuria and suprapubic pain for 6 days.  Pregnancy test negative.  Patient with symptoms consistent with urinary tract infection, with urinalysis showing many bacteria, and positive nitrites without squamous cells.  Feel this is consistent with urinary tract infection.  She denies nausea, vomiting, fevers, and has normal vital signs, however does report some flank pain and will cover with 10 days of antibiotics for possible early pyelonephritis.  She does not show signs of sepsis.  No significant flank pain to suggest nephrolithiasis. Urinalysis also noted to have 15 ketones, she denied having nausea, vomiting, tachypnea, and has normal vital signs, and doubt DKA at this time.  Feel it is most appropriate to treat patient's infection, and encouraged outpatient follow-up, return precautions.   Final Clinical Impressions(s) / ED  Diagnoses   Final diagnoses:  Acute pyelonephritis    ED Discharge Orders        Ordered    cephALEXin (KEFLEX) 500 MG capsule  3 times daily     04/19/17 2251    phenazopyridine (PYRIDIUM) 200 MG tablet  3 times daily     04/19/17 2251       Alvira Monday, MD 04/20/17 1239

## 2017-04-19 NOTE — ED Triage Notes (Signed)
C/o dysuria, lower abd pain x 6 days-NAD-steady gait

## 2017-04-22 LAB — URINE CULTURE

## 2017-04-23 ENCOUNTER — Telehealth: Payer: Self-pay

## 2017-04-23 NOTE — Telephone Encounter (Signed)
Post ED Visit - Positive Culture Follow-up  Culture report reviewed by antimicrobial stewardship pharmacist:  []  Enzo Bi, Pharm.D. []  Celedonio Miyamoto, Pharm.D., BCPS AQ-ID []  Garvin Fila, Pharm.D., BCPS []  Georgina Pillion, Pharm.D., BCPS []  [place], [address].D., BCPS, AAHIVP []  Estella Husk, Pharm.D., BCPS, AAHIVP [x]  Lysle Pearl, PharmD, BCPS []  Casilda Carls, PharmD, BCPS []  Pollyann Samples, PharmD, BCPS  Positive urine culture Treated with Cephalexin, organism sensitive to the same and no further patient follow-up is required at this time.  Jerry Caras 04/23/2017, 10:14 AM

## 2017-04-24 ENCOUNTER — Telehealth: Payer: Self-pay | Admitting: Emergency Medicine

## 2017-04-24 NOTE — Telephone Encounter (Signed)
Post ED Visit - Positive Culture Follow-up  Culture report reviewed by antimicrobial stewardship pharmacist:  []  Enzo Bi, Pharm.D. []  Celedonio Miyamoto, Pharm.D., BCPS AQ-ID []  Garvin Fila, Pharm.D., BCPS []  Georgina Pillion, [address].D., BCPS []  [place], [address].D., BCPS, AAHIVP []  Estella Husk, Pharm.D., BCPS, AAHIVP [x]  Lysle Pearl, PharmD, BCPS []  Casilda Carls, PharmD, BCPS []  Pollyann Samples, PharmD, BCPS  Positive urine culture Treated with cephalexin, organism sensitive to the same and no further patient follow-up is required at this time.  Berle Mull 04/24/2017, 9:06 AM

## 2017-06-26 DIAGNOSIS — G4733 Obstructive sleep apnea (adult) (pediatric): Secondary | ICD-10-CM | POA: Insufficient documentation

## 2017-06-26 HISTORY — DX: Obstructive sleep apnea (adult) (pediatric): G47.33

## 2017-07-01 ENCOUNTER — Encounter (HOSPITAL_BASED_OUTPATIENT_CLINIC_OR_DEPARTMENT_OTHER): Payer: Self-pay | Admitting: Emergency Medicine

## 2017-07-01 ENCOUNTER — Other Ambulatory Visit: Payer: Self-pay

## 2017-07-01 DIAGNOSIS — J45909 Unspecified asthma, uncomplicated: Secondary | ICD-10-CM | POA: Insufficient documentation

## 2017-07-01 DIAGNOSIS — Z87891 Personal history of nicotine dependence: Secondary | ICD-10-CM | POA: Insufficient documentation

## 2017-07-01 DIAGNOSIS — R739 Hyperglycemia, unspecified: Secondary | ICD-10-CM | POA: Insufficient documentation

## 2017-07-01 DIAGNOSIS — E119 Type 2 diabetes mellitus without complications: Secondary | ICD-10-CM | POA: Insufficient documentation

## 2017-07-01 DIAGNOSIS — Z79899 Other long term (current) drug therapy: Secondary | ICD-10-CM | POA: Insufficient documentation

## 2017-07-01 DIAGNOSIS — Z7984 Long term (current) use of oral hypoglycemic drugs: Secondary | ICD-10-CM | POA: Diagnosis not present

## 2017-07-01 LAB — CBG MONITORING, ED: Glucose-Capillary: 165 mg/dL — ABNORMAL HIGH (ref 65–99)

## 2017-07-01 NOTE — ED Triage Notes (Signed)
Pt presents with c/o hyperglycemia since yesterday

## 2017-07-01 NOTE — ED Notes (Signed)
CBG is 165 

## 2017-07-02 ENCOUNTER — Emergency Department (HOSPITAL_BASED_OUTPATIENT_CLINIC_OR_DEPARTMENT_OTHER)
Admission: EM | Admit: 2017-07-02 | Discharge: 2017-07-02 | Disposition: A | Discharge status: Home / Self Care | Payer: BLUE CROSS/BLUE SHIELD | Attending: Emergency Medicine | Admitting: Emergency Medicine

## 2017-07-02 DIAGNOSIS — R739 Hyperglycemia, unspecified: Secondary | ICD-10-CM

## 2017-07-02 NOTE — ED Notes (Signed)
Patient walking out of the room refusing to take the d/c instructions states "we dont want to hear that, they hadnt done shit"

## 2017-07-02 NOTE — Discharge Instructions (Signed)
Perform brisk exercise of 30-45 minutes duration every day  Please meet with a dietitian regarding your dietary choices to better manage your blood sugars

## 2017-07-02 NOTE — ED Provider Notes (Signed)
[place] EMERGENCY DEPARTMENT Provider Note   CSN: [id number] Arrival date & time: 07/01/17  2319     History   Chief Complaint Chief Complaint  Patient presents with  . Hyperglycemia    HPI Karen Patrick is a 52 y.o. female.  HPI 52 year old female who presents the emergency department because of hyperglycemia since yesterday.  She denies nausea vomiting diarrhea.  No dysuria or urinary frequency.  No upper respiratory symptoms.  No fevers or chills.  Denies abdominal pain.  She is on long-acting insulin and oral medications.  She does not exercise.  She has not met with a dietitian in some time.  She reports that her blood sugar at home was 470.  Currently here in the emergency department is 165.  She reports her last hemoglobin A1c was 8.  She is working on trying to improve her blood sugar control.  She is concerned about how high her blood sugar was yesterday.     Past Medical History:  Diagnosis Date  . Ankle fracture, right   . Asthma   . Depression   . Diabetes mellitus   . GERD (gastroesophageal reflux disease)   . Schizoaffective disorder [facility])     Patient Active Problem List   Diagnosis Date Noted  . Adjustment disorder with depressed mood 06/03/2016  . Drug overdose   . Suicidal ideation   . Right ankle injury 11/02/2010    Past Surgical History:  Procedure Laterality Date  . BACK SURGERY    . ENDOMETRIAL ABLATION W/ NOVASURE    . FOOT FASCIOTOMY    . HAMMER TOE SURGERY    . KNEE ARTHROCENTESIS    . LIPOMA EXCISION    . lipoma removed      OB History    No data available       Home Medications    Prior to Admission medications   Medication Sig Start Date End Date Taking? Authorizing Provider  Amphetamine-Dextroamphetamine (ADDERALL PO) Take by mouth.    [provider]  Ascorbic Acid (VITAMIN C) 100 MG tablet Take 100 mg by mouth daily.    [provider]  Biotin 5000 MCG TABS Take 5,000 mcg by mouth daily.     [provider]  CARVEDILOL PO Take by mouth.    [provider]  cholecalciferol (VITAMIN D) 1000 UNITS tablet Take 1,000 Units by mouth every other day.     [provider]  Cranberry 400 MG TABS Take 400 mg by mouth daily.    [provider]  estrogen, conjugated,-medroxyprogesterone (PREMPRO) 0.3-1.5 MG tablet Take 1 tablet by mouth daily. 12/03/14   [provider]  fluticasone (FLOVENT HFA) 220 MCG/ACT inhaler Inhale 2 puffs into the lungs daily.    [provider]  Javier Docker Oil 1000 MG CAPS Take 1,000 mg by mouth daily.    [provider]  Liraglutide (VICTOZA) 18 MG/3ML SOPN Inject 1.8 mg into the skin daily.    [provider]  LOSARTAN POTASSIUM PO Take by mouth.    [provider]  MAGNESIUM PO Take 1 tablet by mouth daily.    [provider]  metFORMIN (GLUCOPHAGE) 1000 MG tablet Take 1 tablet (1,000 mg total) by mouth 2 (two) times daily. Patient taking differently: Take 1,000 mg by mouth daily.  09/22/12   Veryl Speak, MD  Multiple Vitamin (MULTIVITAMIN) capsule Take 1 capsule by mouth daily.    [provider]  phenazopyridine (PYRIDIUM) 200 MG tablet Take 1  tablet (200 mg total) by mouth 3 (three) times daily. 04/19/17   Gareth Morgan, MD    Family History Family History  Problem Relation Age of Onset  . Stroke Mother   . Diabetes Mother   . Hypertension Mother   . Stroke Father   . Sudden death Father   . Hypertension Father   . Hyperlipidemia Father   . Heart attack Father   . Diabetes Father   . Diabetes Maternal Grandmother   . Heart attack Maternal Grandmother   . Hypertension Maternal Grandmother   . Diabetes Paternal Grandmother   . Sudden death Paternal Grandmother     Social History Social History   Tobacco Use  . Smoking status: Former Research scientist (life sciences)  . Smokeless tobacco: Never Used  Substance Use Topics  . Alcohol use: No  . Drug use: No     Allergies     Patient has no known allergies.   Review of Systems Review of Systems  All other systems reviewed and are negative.    Physical Exam Updated Vital Signs BP (!) 143/95 (BP Location: Right Arm)   Pulse 74   Temp 97.9 F (36.6 C) (Oral)   Resp 20   Ht _0  (1.575 m)   Wt 89.2 kg (196 lb 10.4 oz)   SpO2 99%   BMI 35.97 kg/m   Physical Exam  Constitutional: She is oriented to person, place, and time. She appears well-developed and well-nourished.  HENT:  Head: Normocephalic.  Eyes: EOM are normal.  Neck: Normal range of motion.  Pulmonary/Chest: Effort normal.  Abdominal: She exhibits no distension.  Musculoskeletal: Normal range of motion.  Neurological: She is alert and oriented to person, place, and time.  Psychiatric: She has a normal mood and affect.  Nursing note and vitals reviewed.    ED Treatments / Results  Labs (all labs ordered are listed, but only abnormal results are displayed) Labs Reviewed  CBG MONITORING, ED - Abnormal; Notable for the following components:      Result Value   Glucose-Capillary 165 (*)    All other components within normal limits    EKG  EKG Interpretation None       Radiology No results found.  Procedures Procedures (including critical care time)  Medications Ordered in ED Medications - No data to display   Initial Impression / Assessment and Plan / ED Course  I have reviewed the triage vital signs and the nursing notes.  Pertinent labs & imaging results that were available during my care of the patient were reviewed by me and considered in my medical decision making (see chart for details).    Vital signs are stable.  Blood sugar is 165.  I do not think blood work would assist at this time.  I do not think she needs admission the hospital.  I also do not believe that I am the appropriate person to be making alterations in her diabetic medications.  I recommend that she speak with her primary care team regarding  changes to her diabetes meds.  In addition she needs to exercise daily and meet with a dietitian.  I am encouraged that she is concerned about her blood sugar  Final Clinical Impressions(s) / ED Diagnoses   Final diagnoses:  Hyperglycemia    ED Discharge Orders    None       Jola Schmidt, MD 07/02/17 [phone]

## 2017-08-06 ENCOUNTER — Emergency Department (HOSPITAL_BASED_OUTPATIENT_CLINIC_OR_DEPARTMENT_OTHER)
Admission: EM | Admit: 2017-08-06 | Discharge: 2017-08-06 | Disposition: A | Discharge status: Home / Self Care | Payer: BLUE CROSS/BLUE SHIELD | Attending: Physician Assistant | Admitting: Physician Assistant

## 2017-08-06 ENCOUNTER — Other Ambulatory Visit: Payer: Self-pay

## 2017-08-06 ENCOUNTER — Emergency Department (HOSPITAL_BASED_OUTPATIENT_CLINIC_OR_DEPARTMENT_OTHER): Payer: BLUE CROSS/BLUE SHIELD

## 2017-08-06 ENCOUNTER — Encounter (HOSPITAL_BASED_OUTPATIENT_CLINIC_OR_DEPARTMENT_OTHER): Payer: Self-pay | Admitting: *Deleted

## 2017-08-06 DIAGNOSIS — E119 Type 2 diabetes mellitus without complications: Secondary | ICD-10-CM | POA: Diagnosis not present

## 2017-08-06 DIAGNOSIS — Z87891 Personal history of nicotine dependence: Secondary | ICD-10-CM | POA: Insufficient documentation

## 2017-08-06 DIAGNOSIS — R10814 Left lower quadrant abdominal tenderness: Secondary | ICD-10-CM | POA: Insufficient documentation

## 2017-08-06 DIAGNOSIS — R1032 Left lower quadrant pain: Secondary | ICD-10-CM | POA: Diagnosis present

## 2017-08-06 DIAGNOSIS — Z79899 Other long term (current) drug therapy: Secondary | ICD-10-CM | POA: Insufficient documentation

## 2017-08-06 DIAGNOSIS — R109 Unspecified abdominal pain: Secondary | ICD-10-CM

## 2017-08-06 DIAGNOSIS — J45909 Unspecified asthma, uncomplicated: Secondary | ICD-10-CM | POA: Insufficient documentation

## 2017-08-06 LAB — URINALYSIS, ROUTINE W REFLEX MICROSCOPIC
Bilirubin Urine: NEGATIVE
Glucose, UA: NEGATIVE mg/dL
Hgb urine dipstick: NEGATIVE
Ketones, ur: NEGATIVE mg/dL
LEUKOCYTES UA: NEGATIVE
NITRITE: NEGATIVE
Protein, ur: NEGATIVE mg/dL
SPECIFIC GRAVITY, URINE: 1.01 (ref 1.005–1.030)
pH: 6 (ref 5.0–8.0)

## 2017-08-06 LAB — CBC WITH DIFFERENTIAL/PLATELET
BASOS PCT: 0 %
Basophils Absolute: 0 10*3/uL (ref 0.0–0.1)
Eosinophils Absolute: 0.1 10*3/uL (ref 0.0–0.7)
Eosinophils Relative: 2 %
HEMATOCRIT: 38.5 % (ref 36.0–46.0)
Hemoglobin: 12.5 g/dL (ref 12.0–15.0)
Lymphocytes Relative: 45 %
Lymphs Abs: 4.1 10*3/uL — ABNORMAL HIGH (ref 0.7–4.0)
MCH: 28.7 pg (ref 26.0–34.0)
MCHC: 32.5 g/dL (ref 30.0–36.0)
MCV: 88.3 fL (ref 78.0–100.0)
MONO ABS: 0.6 10*3/uL (ref 0.1–1.0)
MONOS PCT: 7 %
NEUTROS ABS: 4.2 10*3/uL (ref 1.7–7.7)
Neutrophils Relative %: 46 %
Platelets: 247 10*3/uL (ref 150–400)
RBC: 4.36 MIL/uL (ref 3.87–5.11)
RDW: 13 % (ref 11.5–15.5)
WBC: 9 10*3/uL (ref 4.0–10.5)

## 2017-08-06 LAB — COMPREHENSIVE METABOLIC PANEL
ALBUMIN: 4.1 g/dL (ref 3.5–5.0)
ALK PHOS: 74 U/L (ref 38–126)
ALT: 22 U/L (ref 14–54)
ANION GAP: 11 (ref 5–15)
AST: 19 U/L (ref 15–41)
BUN: 26 mg/dL — AB (ref 6–20)
CO2: 22 mmol/L (ref 22–32)
CREATININE: 0.74 mg/dL (ref 0.44–1.00)
Calcium: 8.7 mg/dL — ABNORMAL LOW (ref 8.9–10.3)
Chloride: 101 mmol/L (ref 101–111)
GFR calc Af Amer: >60 mL/min (ref >60)
GFR calc non Af Amer: >60 mL/min (ref >60)
GLUCOSE: 110 mg/dL — AB (ref 65–99)
Potassium: 3.3 mmol/L — ABNORMAL LOW (ref 3.5–5.1)
SODIUM: 134 mmol/L — AB (ref 135–145)
Total Bilirubin: 0.6 mg/dL (ref 0.3–1.2)
Total Protein: 7.7 g/dL (ref 6.5–8.1)

## 2017-08-06 MED ORDER — IOPAMIDOL (ISOVUE-300) INJECTION 61%
100.0000 mL | Freq: Once | INTRAVENOUS | Status: AC | PRN
  Administered 2017-08-06: 100 mL via INTRAVENOUS

## 2017-08-06 MED ORDER — FENTANYL CITRATE (PF) 100 MCG/2ML IJ SOLN
25.0000 ug | Freq: Once | INTRAMUSCULAR | Status: AC
  Administered 2017-08-06: 25 ug via INTRAVENOUS
  Filled 2017-08-06: qty 2

## 2017-08-06 MED ORDER — SODIUM CHLORIDE 0.9 % IV BOLUS
500.0000 mL | Freq: Once | INTRAVENOUS | Status: AC
  Administered 2017-08-06: 500 mL via INTRAVENOUS

## 2017-08-06 MED ORDER — GI COCKTAIL ~~LOC~~
30.0000 mL | Freq: Once | ORAL | Status: AC
  Administered 2017-08-06: 30 mL via ORAL
  Filled 2017-08-06: qty 30

## 2017-08-06 MED ORDER — ONDANSETRON HCL 4 MG/2ML IJ SOLN
4.0000 mg | Freq: Once | INTRAMUSCULAR | Status: AC
  Administered 2017-08-06: 4 mg via INTRAVENOUS
  Filled 2017-08-06: qty 2

## 2017-08-06 MED ORDER — RANITIDINE HCL 150 MG PO TABS: 150.0000 mg | ORAL_TABLET | Freq: Every day | ORAL | 0 refills | Status: DC

## 2017-08-06 MED ORDER — KETOROLAC TROMETHAMINE 30 MG/ML IJ SOLN
30.0000 mg | Freq: Once | INTRAMUSCULAR | Status: AC
  Administered 2017-08-06: 30 mg via INTRAVENOUS
  Filled 2017-08-06: qty 1

## 2017-08-06 NOTE — Discharge Instructions (Signed)
We cannot find a cause for abdominal pain today.  Your urine was reassuring.  Your CT showed no evidence of infection.  And your labs were reassuring.  Please be sure to monitor symptoms, keep a record of them and follow-up with your primary care physician.  We will give you something right now to help with acid in your stomach for the next 2 weeks give that a try and see if it helps with your symptoms.

## 2017-08-06 NOTE — ED Triage Notes (Signed)
Abdominal and back pain for a week. She thinks she has a UTI.

## 2017-08-06 NOTE — ED Provider Notes (Signed)
[facility] EMERGENCY DEPARTMENT Provider Note   CSN: [id number] Arrival date & time: 08/06/17  7591     History   Chief Complaint Chief Complaint  Patient presents with  . Abdominal Pain  . Back Pain    HPI Karen Patrick is a 52 y.o. female.  HPI   52 year old female who is presenting with left lower quadrant pain.  Patient reports that she think she has UTI.  She reports that she had had 2 days of loose stools and then developed left lower quadrant pain.  She says she feels like she might of been warm over the last several days.  Patient has no history of diverticulitis.  Patient is on multiple diabetes medications as well as to "fluid pills".    Past Medical History:  Diagnosis Date  . Ankle fracture, right   . Asthma   . Depression   . Diabetes mellitus   . GERD (gastroesophageal reflux disease)   . Schizoaffective disorder [facility])     Patient Active Problem List   Diagnosis Date Noted  . Adjustment disorder with depressed mood 06/03/2016  . Drug overdose   . Suicidal ideation   . Right ankle injury 11/02/2010    Past Surgical History:  Procedure Laterality Date  . BACK SURGERY    . ENDOMETRIAL ABLATION W/ NOVASURE    . FOOT FASCIOTOMY    . HAMMER TOE SURGERY    . KNEE ARTHROCENTESIS    . LIPOMA EXCISION    . lipoma removed       OB History   None      Home Medications    Prior to Admission medications   Medication Sig Start Date End Date Taking? Authorizing Provider  Ascorbic Acid (VITAMIN C) 100 MG tablet Take 100 mg by mouth daily.   Yes [provider]  Biotin 5000 MCG TABS Take 5,000 mcg by mouth daily.   Yes [provider]  CARVEDILOL PO Take by mouth.   Yes [provider]  cholecalciferol (VITAMIN D) 1000 UNITS tablet Take 1,000 Units by mouth every other day.    Yes [provider]  Cranberry 400 MG TABS Take 400 mg by mouth daily.   Yes [provider]  estrogen,  conjugated,-medroxyprogesterone (PREMPRO) 0.3-1.5 MG tablet Take 1 tablet by mouth daily. 12/03/14  Yes [provider]  fluticasone (FLOVENT HFA) 220 MCG/ACT inhaler Inhale 2 puffs into the lungs daily.   Yes [provider]  insulin glargine (LANTUS) 100 UNIT/ML injection Inject into the skin at bedtime.   Yes [provider]  Boris Lown Oil 1000 MG CAPS Take 1,000 mg by mouth daily.   Yes [provider]  LOSARTAN POTASSIUM PO Take by mouth.   Yes [provider]  metFORMIN (GLUCOPHAGE) 1000 MG tablet Take 1 tablet (1,000 mg total) by mouth 2 (two) times daily. Patient taking differently: Take 1,000 mg by mouth daily.  09/22/12  Yes Delo, Riley Lam, MD  Multiple Vitamin (MULTIVITAMIN) capsule Take 1 capsule by mouth daily.   Yes [provider]  Semaglutide (OZEMPIC [place]) Inject into the skin.   Yes [provider]  Amphetamine-Dextroamphetamine (ADDERALL PO) Take by mouth.    [provider]  Liraglutide (VICTOZA) 18 MG/3ML SOPN Inject 1.8 mg into the skin daily.    [provider]  MAGNESIUM PO Take 1 tablet by mouth daily.    [provider]  phenazopyridine (PYRIDIUM) 200 MG tablet Take 1 tablet (200 mg total) by mouth  3 (three) times daily. 04/19/17   Alvira Monday, MD    Family History Family History  Problem Relation Age of Onset  . Stroke Mother   . Diabetes Mother   . Hypertension Mother   . Stroke Father   . Sudden death Father   . Hypertension Father   . Hyperlipidemia Father   . Heart attack Father   . Diabetes Father   . Diabetes Maternal Grandmother   . Heart attack Maternal Grandmother   . Hypertension Maternal Grandmother   . Diabetes Paternal Grandmother   . Sudden death Paternal Grandmother     Social History Social History   Tobacco Use  . Smoking status: Former Games developer  . Smokeless tobacco: Never Used  Substance Use Topics  . Alcohol use: No  . Drug use: No      Allergies   Patient has no known allergies.   Review of Systems Review of Systems  Constitutional: Positive for fatigue and fever. Negative for activity change.  HENT: Negative for congestion.   Respiratory: Negative for shortness of breath.   Cardiovascular: Negative for chest pain.  Gastrointestinal: Positive for abdominal pain, diarrhea and nausea. Negative for constipation and vomiting.  All other systems reviewed and are negative.    Physical Exam Updated Vital Signs BP 98/72   Pulse 77   Temp 98.1 F (36.7 C) (Oral)   Resp 16   Ht 5\' 2"  (1.575 m)   Wt 89.8 kg (198 lb)   SpO2 98%   BMI 36.21 kg/m   Physical Exam  Constitutional: She is oriented to person, place, and time. She appears well-developed and well-nourished.  HENT:  Head: Normocephalic and atraumatic.  Eyes: Right eye exhibits no discharge.  Cardiovascular: Normal rate.  Pulmonary/Chest: Effort normal.  Abdominal: Normal appearance and bowel sounds are normal. There is tenderness in the left lower quadrant. There is no rigidity.  Neurological: She is oriented to person, place, and time.  Skin: Skin is warm and dry. She is not diaphoretic.  Psychiatric: She has a normal mood and affect.  Nursing note and vitals reviewed.    ED Treatments / Results  Labs (all labs ordered are listed, but only abnormal results are displayed) Labs Reviewed  URINALYSIS, ROUTINE W REFLEX MICROSCOPIC  COMPREHENSIVE METABOLIC PANEL  CBC WITH DIFFERENTIAL/PLATELET    EKG None  Radiology No results found.  Procedures Procedures (including critical care time)  Medications Ordered in ED Medications  ketorolac (TORADOL) 30 MG/ML injection 30 mg (has no administration in time range)  sodium chloride 0.9 % bolus 500 mL (has no administration in time range)     Initial Impression / Assessment and Plan / ED Course  I have reviewed the triage vital signs and the nursing notes.  Pertinent labs & imaging  results that were available during my care of the patient were reviewed by me and considered in my medical decision making (see chart for details).     52 year old female who is presenting with left lower quadrant pain.  Patient reports that she think she has UTI.  She reports that she had had 2 days of loose stools and then developed left lower quadrant pain.  She says she feels like she might of been warm over the last several days.  Patient has no history of diverticulitis.  Patient is on multiple diabetes medications as well as two "fluid pills".  8:04 PM I suspect diverticulitis.  Will get labs, CT.  11:26 PM Pt CT is normal.  Patient's been able to tolerate p.o.  Vital signs remained normal and labs are reassuring.  We will have her follow-up with her primary care physician.  We will treat her with ranitidine for gastritis.    Final Clinical Impressions(s) / ED Diagnoses   Final diagnoses:  None    ED Discharge Orders    None       Abelino Derrick, MD 08/07/17 2326

## 2017-11-06 DIAGNOSIS — I429 Cardiomyopathy, unspecified: Secondary | ICD-10-CM

## 2017-11-06 HISTORY — DX: Cardiomyopathy, unspecified: I42.9

## 2018-01-07 DIAGNOSIS — F900 Attention-deficit hyperactivity disorder, predominantly inattentive type: Secondary | ICD-10-CM

## 2018-01-07 HISTORY — DX: Attention-deficit hyperactivity disorder, predominantly inattentive type: F90.0

## 2018-06-06 DIAGNOSIS — E785 Hyperlipidemia, unspecified: Secondary | ICD-10-CM

## 2018-06-06 DIAGNOSIS — E7849 Other hyperlipidemia: Secondary | ICD-10-CM

## 2018-06-06 HISTORY — DX: Hyperlipidemia, unspecified: E78.5

## 2018-06-06 HISTORY — DX: Other hyperlipidemia: E78.49

## 2018-07-05 IMAGING — CT CT ABD-PELV W/ CM
2 of 5 series · 16 of 46 positions shown, 18 images · IV contrast (APPLIED)
Comparison: CT abdomen and pelvis September 14, 2015

CLINICAL DATA: Diarrhea, LEFT lower quadrant pain for 2 days.
Possible urinary tract infection. History of diabetes, spine
surgery.

EXAM:
CT ABDOMEN AND PELVIS WITH CONTRAST
TECHNIQUE: Multidetector CT imaging of the abdomen and pelvis was performed
using the standard protocol following bolus administration of
intravenous contrast.
CONTRAST:  100mL [id number] IOPAMIDOL ([id number]) INJECTION 61%

[Series 2: axial st · axial · 0.80mm/px · z∈[+706,+1106]mm · 13 of 90 slices shown, 15 images]
[im 5/90  soft-tissue]
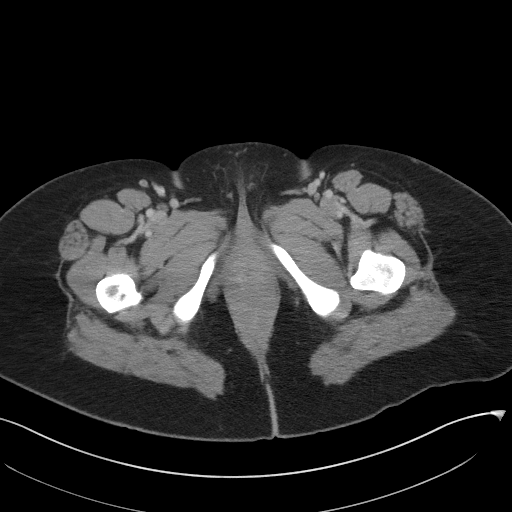
[im 5/90  bone]
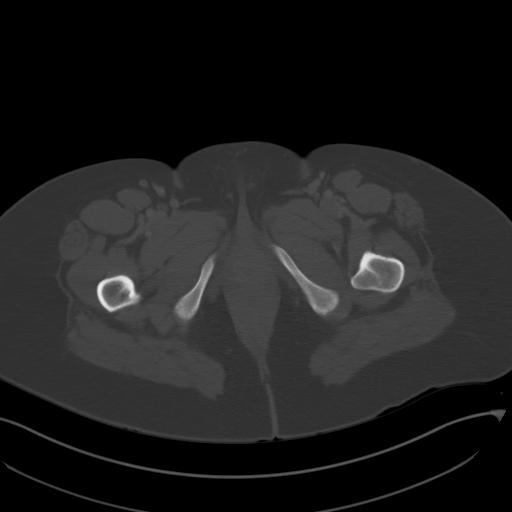
[im 15/90  soft-tissue]
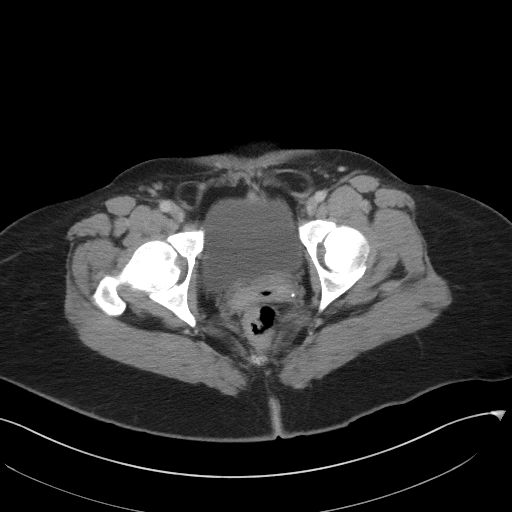
[im 19/90  soft-tissue]
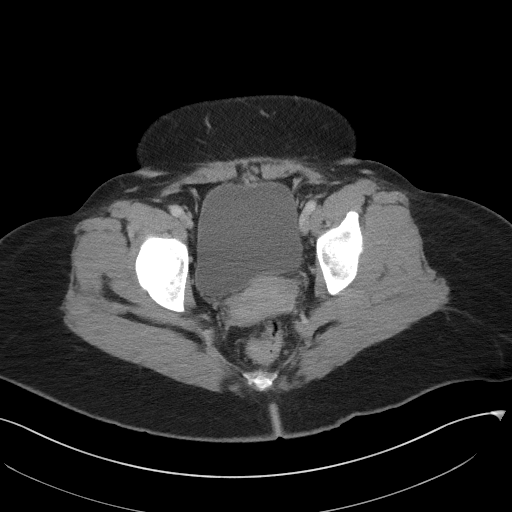
[im 24/90  soft-tissue]
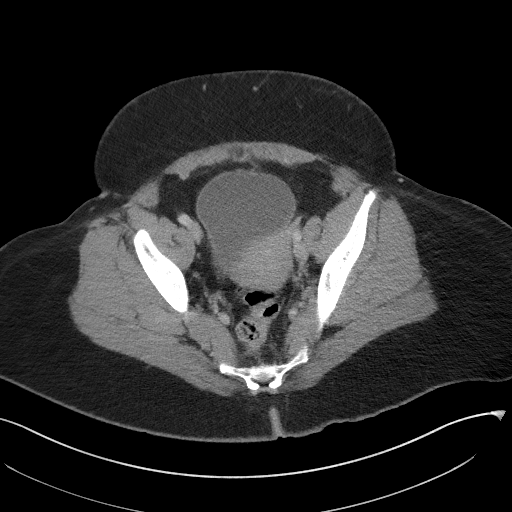
[im 33/90  soft-tissue]
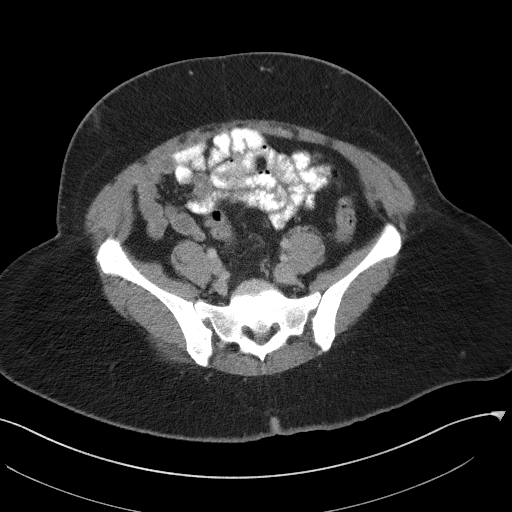
[im 38/90  soft-tissue]
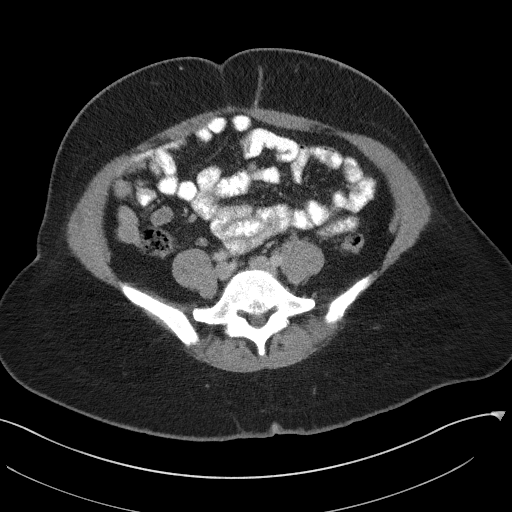
[im 47/90  soft-tissue]
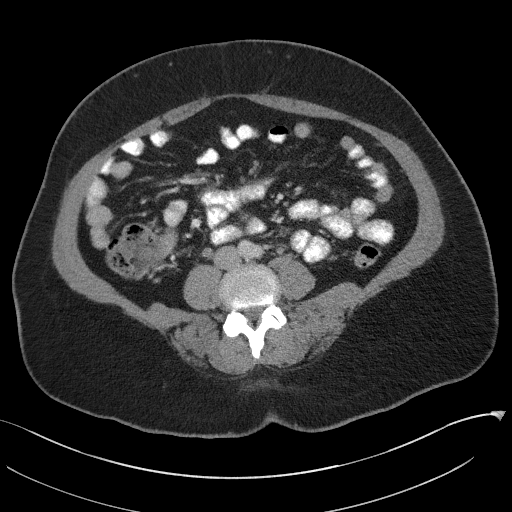
[im 52/90  soft-tissue]
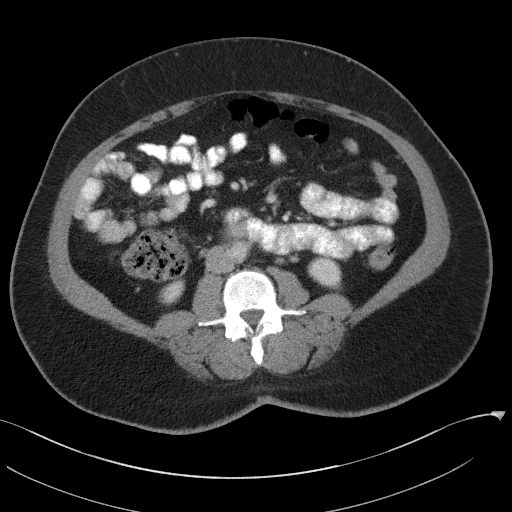
[im 57/90  soft-tissue]
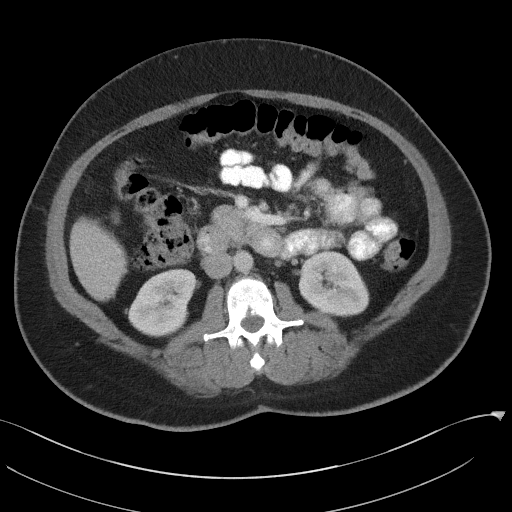
[im 57/90  bone]
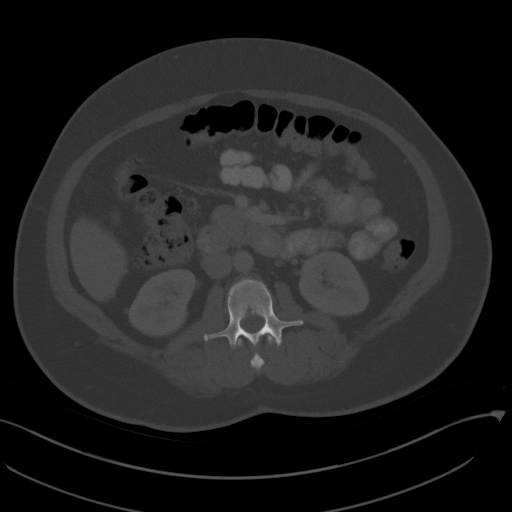
[im 66/90  soft-tissue]
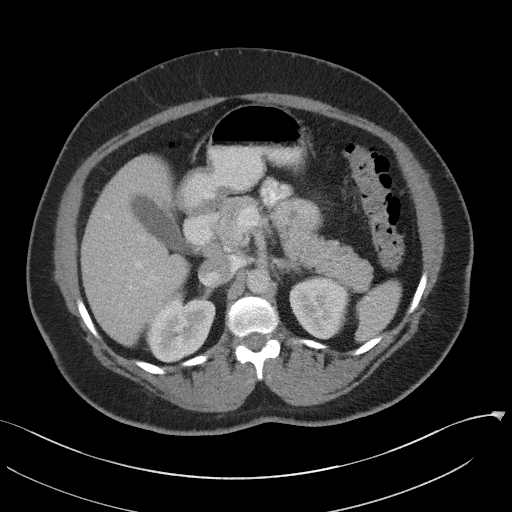
[im 71/90  soft-tissue]
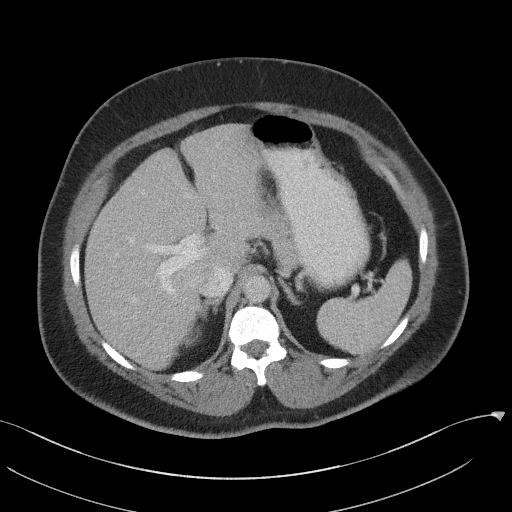
[im 75/90  soft-tissue]
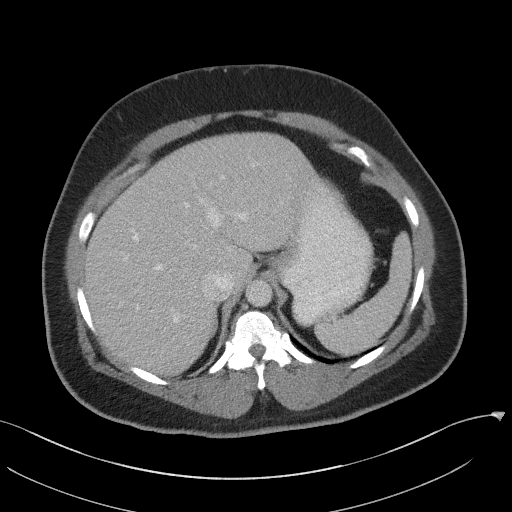
[im 85/90  soft-tissue]
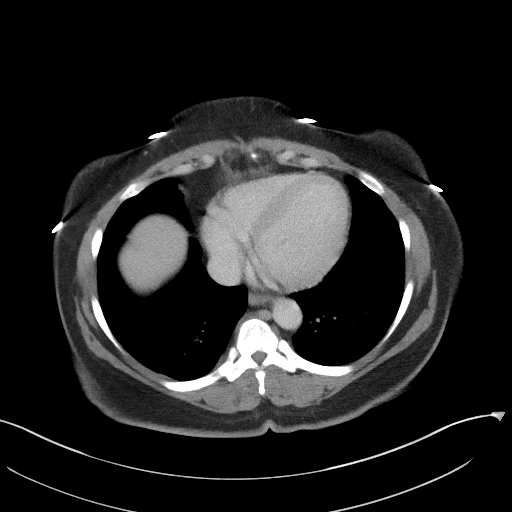

[Series 5: coronal st · coronal · 0.85mm/px · 3 of 105 slices shown]
[im 35/105  soft-tissue]
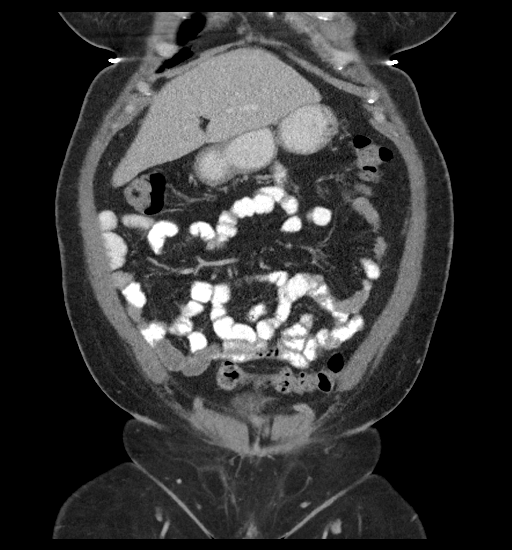
[im 47/105  soft-tissue]
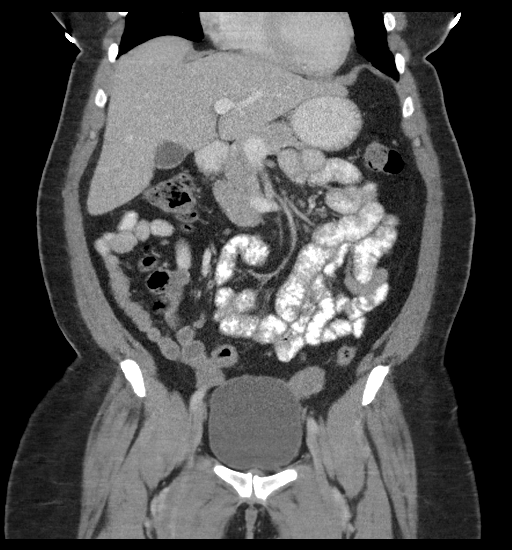
[im 58/105  soft-tissue]
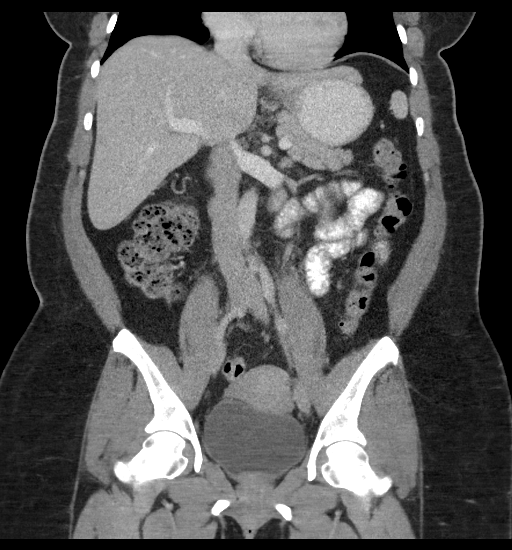

[16 of 46 positions shown; findings below may reference images not displayed]

FINDINGS: LOWER CHEST: Lung bases are clear. Included heart size is normal. No
pericardial effusion.

HEPATOBILIARY: Liver and gallbladder are normal.

PANCREAS: Normal.

SPLEEN: Normal.

ADRENALS/URINARY TRACT: Kidneys are orthotopic, demonstrating
symmetric enhancement. No nephrolithiasis, hydronephrosis or solid
renal masses. The unopacified ureters are normal in course and
caliber. Delayed imaging through the kidneys demonstrates symmetric
prompt contrast excretion within the proximal urinary collecting
system. Urinary bladder is well distended and unremarkable. Normal
adrenal glands.

STOMACH/BOWEL: The stomach, small and large bowel are normal in
course and caliber without inflammatory changes. A few scattered
colonic diverticula present. Normal appendix.

VASCULAR/LYMPHATIC: Aortoiliac vessels are normal in course and
caliber. Trace calcific atherosclerosis. No lymphadenopathy by CT
size criteria.

Enteric contrast has not yet reached the distal small bowel.

REPRODUCTIVE: Normal.

OTHER: No intraperitoneal free fluid or free air.

MUSCULOSKELETAL: Nonacute. Stable moderate fat containing inguinal
hernias. Mild to moderate lower lumbar facet arthropathy.
IMPRESSION: 1. Mild colonic diverticulosis without acute diverticulitis nor
acute intra-abdominopelvic process.

## 2019-01-25 ENCOUNTER — Emergency Department (HOSPITAL_BASED_OUTPATIENT_CLINIC_OR_DEPARTMENT_OTHER): Payer: Self-pay

## 2019-01-25 ENCOUNTER — Emergency Department (HOSPITAL_BASED_OUTPATIENT_CLINIC_OR_DEPARTMENT_OTHER)
Admission: EM | Admit: 2019-01-25 | Discharge: 2019-01-25 | Disposition: A | Discharge status: Home / Self Care | Payer: Self-pay | Attending: Emergency Medicine | Admitting: Emergency Medicine

## 2019-01-25 ENCOUNTER — Other Ambulatory Visit: Payer: Self-pay

## 2019-01-25 ENCOUNTER — Encounter (HOSPITAL_BASED_OUTPATIENT_CLINIC_OR_DEPARTMENT_OTHER): Payer: Self-pay | Admitting: Emergency Medicine

## 2019-01-25 DIAGNOSIS — R079 Chest pain, unspecified: Secondary | ICD-10-CM | POA: Insufficient documentation

## 2019-01-25 DIAGNOSIS — Z79899 Other long term (current) drug therapy: Secondary | ICD-10-CM | POA: Insufficient documentation

## 2019-01-25 DIAGNOSIS — J45909 Unspecified asthma, uncomplicated: Secondary | ICD-10-CM | POA: Insufficient documentation

## 2019-01-25 DIAGNOSIS — Z794 Long term (current) use of insulin: Secondary | ICD-10-CM | POA: Insufficient documentation

## 2019-01-25 DIAGNOSIS — R0602 Shortness of breath: Secondary | ICD-10-CM | POA: Insufficient documentation

## 2019-01-25 DIAGNOSIS — Z87891 Personal history of nicotine dependence: Secondary | ICD-10-CM | POA: Insufficient documentation

## 2019-01-25 DIAGNOSIS — E119 Type 2 diabetes mellitus without complications: Secondary | ICD-10-CM | POA: Insufficient documentation

## 2019-01-25 LAB — CBC
HCT: 42 % (ref 36.0–46.0)
Hemoglobin: 13.7 g/dL (ref 12.0–15.0)
MCH: 29.1 pg (ref 26.0–34.0)
MCHC: 32.6 g/dL (ref 30.0–36.0)
MCV: 89.2 fL (ref 80.0–100.0)
Platelets: 271 10*3/uL (ref 150–400)
RBC: 4.71 MIL/uL (ref 3.87–5.11)
RDW: 12.5 % (ref 11.5–15.5)
WBC: 9.3 10*3/uL (ref 4.0–10.5)
nRBC: 0 % (ref 0.0–0.2)

## 2019-01-25 LAB — BASIC METABOLIC PANEL
Anion gap: 13 (ref 5–15)
BUN: 21 mg/dL — ABNORMAL HIGH (ref 6–20)
CO2: 24 mmol/L (ref 22–32)
Calcium: 9.2 mg/dL (ref 8.9–10.3)
Chloride: 100 mmol/L (ref 98–111)
Creatinine, Ser: 0.69 mg/dL (ref 0.44–1.00)
GFR calc Af Amer: >60 mL/min (ref >60)
GFR calc non Af Amer: >60 mL/min (ref >60)
Glucose, Bld: 136 mg/dL — ABNORMAL HIGH (ref 70–99)
Potassium: 3.4 mmol/L — ABNORMAL LOW (ref 3.5–5.1)
Sodium: 137 mmol/L (ref 135–145)

## 2019-01-25 LAB — PREGNANCY, URINE: Preg Test, Ur: NEGATIVE

## 2019-01-25 LAB — TROPONIN I (HIGH SENSITIVITY)
Troponin I (High Sensitivity): 4 ng/L (ref <18)
Troponin I (High Sensitivity): 4 ng/L (ref <18)

## 2019-01-25 LAB — BRAIN NATRIURETIC PEPTIDE: B Natriuretic Peptide: 10.8 pg/mL (ref 0.0–100.0)

## 2019-01-25 MED ORDER — KETOROLAC TROMETHAMINE 15 MG/ML IJ SOLN
15.0000 mg | Freq: Once | INTRAMUSCULAR | Status: AC
  Administered 2019-01-25: 22:00:00 15 mg via INTRAVENOUS
  Filled 2019-01-25: qty 1

## 2019-01-25 MED ORDER — ASPIRIN 81 MG PO CHEW
324.0000 mg | CHEWABLE_TABLET | Freq: Once | ORAL | Status: AC
  Administered 2019-01-25: 324 mg via ORAL
  Filled 2019-01-25: qty 4

## 2019-01-25 MED ORDER — POTASSIUM CHLORIDE CRYS ER 20 MEQ PO TBCR
40.0000 meq | EXTENDED_RELEASE_TABLET | Freq: Once | ORAL | Status: AC
  Administered 2019-01-25: 40 meq via ORAL
  Filled 2019-01-25: qty 2

## 2019-01-25 NOTE — Discharge Instructions (Signed)
If you develop recurrent, continued, or worsening chest pain, shortness of breath, fever, vomiting, abdominal or back pain, or any other new/concerning symptoms then return to the ER for evaluation.  

## 2019-01-25 NOTE — ED Provider Notes (Signed)
[facility] EMERGENCY DEPARTMENT Provider Note   CSN: [id number] Arrival date & time: 01/25/19  1850     History   Chief Complaint Chief Complaint  Patient presents with  . Chest Pain    HPI Karen Patrick is a 53 y.o. female.     HPI  53 year old female presents with chest pain.  States it has been ongoing for about a week.  Typically occurs when she does certain activities.  She also has have been having random sweats as well as significant fatigue.  Some shortness of breath when she does activities.  Today she was on the treadmill and the chest pain or shortness of breath seemed worse than typical.  No fevers.  No cough.  Has not had any leg swelling.  Had a similar episode about 2 years ago and states she was diagnosed with cardiomyopathy.  Past Medical History:  Diagnosis Date  . Ankle fracture, right   . Asthma   . Depression   . Diabetes mellitus   . GERD (gastroesophageal reflux disease)   . Schizoaffective disorder [facility])     Patient Active Problem List   Diagnosis Date Noted  . Adjustment disorder with depressed mood 06/03/2016  . Drug overdose   . Suicidal ideation   . Right ankle injury 11/02/2010    Past Surgical History:  Procedure Laterality Date  . BACK SURGERY    . ENDOMETRIAL ABLATION W/ NOVASURE    . FOOT FASCIOTOMY    . HAMMER TOE SURGERY    . KNEE ARTHROCENTESIS    . LIPOMA EXCISION    . lipoma removed       OB History   No obstetric history on file.      Home Medications    Prior to Admission medications   Medication Sig Start Date End Date Taking? Authorizing Provider  Ascorbic Acid (VITAMIN C) 100 MG tablet Take 100 mg by mouth daily.   Yes [provider]  CARVEDILOL PO Take by mouth.   Yes [provider]  cholecalciferol (VITAMIN D) 1000 UNITS tablet Take 1,000 Units by mouth every other day.    Yes [provider]  Cranberry 400 MG TABS Take 400 mg by mouth daily.   Yes [provider]  estrogen, conjugated,-medroxyprogesterone (PREMPRO) 0.3-1.5 MG tablet Take 1 tablet by mouth daily. 12/03/14  Yes [provider]  insulin glargine (LANTUS) 100 UNIT/ML injection Inject into the skin at bedtime.   Yes [provider]  Boris Lown Oil 1000 MG CAPS Take 1,000 mg by mouth daily.   Yes [provider]  Liraglutide (VICTOZA) 18 MG/3ML SOPN Inject 1.8 mg into the skin daily.   Yes [provider]  LOSARTAN POTASSIUM PO Take by mouth.   Yes [provider]  metFORMIN (GLUCOPHAGE) 1000 MG tablet Take 1 tablet (1,000 mg total) by mouth 2 (two) times daily. Patient taking differently: Take 1,000 mg by mouth daily.  09/22/12  Yes Delo, Riley Lam, MD  Multiple Vitamin (MULTIVITAMIN) capsule Take 1 capsule by mouth daily.   Yes [provider]    Family History Family History  Problem Relation Age of Onset  . Stroke Mother   . Diabetes Mother   . Hypertension Mother   . Stroke Father   . Sudden death Father   . Hypertension Father   . Hyperlipidemia Father   . Heart attack Father   . Diabetes Father   . Diabetes Maternal Grandmother   . Heart attack Maternal Grandmother   .  Hypertension Maternal Grandmother   . Diabetes Paternal Grandmother   . Sudden death Paternal Grandmother     Social History Social History   Tobacco Use  . Smoking status: Former Research scientist (life sciences)  . Smokeless tobacco: Never Used  Substance Use Topics  . Alcohol use: No  . Drug use: No     Allergies   Patient has no known allergies.   Review of Systems Review of Systems  Constitutional: Positive for diaphoresis and fatigue. Negative for fever.  Respiratory: Positive for shortness of breath. Negative for cough.   Cardiovascular: Positive for chest pain. Negative for leg swelling.  Gastrointestinal: Negative for vomiting.  All other systems reviewed and are negative.    Physical Exam Updated Vital Signs BP 121/80   Pulse 75   Temp 98.3 F (36.8 C)  (Oral)   Resp 17   Ht 5\' 3"  (1.6 m)   Wt 83.5 kg   SpO2 97%   BMI 32.59 kg/m   Physical Exam Vitals signs and nursing note reviewed.  Constitutional:      Appearance: She is well-developed.  HENT:     Head: Normocephalic and atraumatic.     Right Ear: External ear normal.     Left Ear: External ear normal.     Nose: Nose normal.  Eyes:     General:        Right eye: No discharge.        Left eye: No discharge.  Cardiovascular:     Rate and Rhythm: Normal rate and regular rhythm.     Heart sounds: Normal heart sounds.  Pulmonary:     Effort: Pulmonary effort is normal.     Breath sounds: Normal breath sounds.  Chest:     Chest wall: No tenderness.  Abdominal:     Palpations: Abdomen is soft.     Tenderness: There is no abdominal tenderness.  Musculoskeletal:     Right lower leg: No edema.     Left lower leg: No edema.  Skin:    General: Skin is warm and dry.  Neurological:     Mental Status: She is alert.  Psychiatric:        Mood and Affect: Mood is not anxious.      ED Treatments / Results  Labs (all labs ordered are listed, but only abnormal results are displayed) Labs Reviewed  BASIC METABOLIC PANEL - Abnormal; Notable for the following components:      Result Value   Potassium 3.4 (*)    Glucose, Bld 136 (*)    BUN 21 (*)    All other components within normal limits  CBC  PREGNANCY, URINE  BRAIN NATRIURETIC PEPTIDE  TROPONIN I (HIGH SENSITIVITY)  TROPONIN I (HIGH SENSITIVITY)    EKG EKG Interpretation  Date/Time:  Sunday January 25 2019 21:13:17 EDT Ventricular Rate:  79 PR Interval:    QRS Duration: 97 QT Interval:  394 QTC Calculation: 452 R Axis:   10 Text Interpretation:  Sinus rhythm Borderline T wave abnormalities no significant change since earlier in the day Confirmed by Sherwood Gambler [phone]) on 01/25/2019 10:28:11 PM   Radiology Dg Chest 2 View  Result Date: 01/25/2019 CLINICAL DATA:  53 year old female with left side chest  pain and nausea. EXAM: CHEST - 2 VIEW COMPARISON:  Chest radiographs 03/05/2017 and earlier. FINDINGS: Hair artifact at the thoracic inlet. Stable lung volumes. Normal cardiac size and mediastinal contours. Visualized tracheal air column is within normal limits. Both lungs appear clear. No  pneumothorax or pleural effusion. No acute osseous abnormality identified. Negative visible bowel gas pattern. IMPRESSION: Negative.  No acute cardiopulmonary abnormality. Electronically Signed   By: Odessa Fleming M.D.   On: 01/25/2019 19:48    Procedures Procedures (including critical care time)  Medications Ordered in ED Medications  aspirin chewable tablet 324 mg (324 mg Oral Given 01/25/19 1914)  potassium chloride SA (K-DUR) CR tablet 40 mEq (40 mEq Oral Given 01/25/19 2006)  ketorolac (TORADOL) 15 MG/ML injection 15 mg (15 mg Intravenous Given 01/25/19 2153)     Initial Impression / Assessment and Plan / ED Course  I have reviewed the triage vital signs and the nursing notes.  Pertinent labs & imaging results that were available during my care of the patient were reviewed by me and considered in my medical decision making (see chart for details).        Patient's ECG and troponins are benign.  Could have some anginal component given some reproducibility when she walks, but this does not appear to be unstable angina and there is no STEMI or non-STEMI.  At this point, I think she is stable for outpatient management with her cardiologist and the patient agrees and would like to go home.  Otherwise, my suspicion for something like PE or dissection is pretty low.  We discussed return precautions.  Final Clinical Impressions(s) / ED Diagnoses   Final diagnoses:  Nonspecific chest pain    ED Discharge Orders    None       Pricilla Loveless, MD 01/25/19 2231

## 2019-01-25 NOTE — ED Notes (Signed)
Pt was given water. 

## 2019-01-25 NOTE — ED Triage Notes (Signed)
L side chest pain with nausea x several days.

## 2019-05-27 ENCOUNTER — Encounter: Payer: Self-pay | Admitting: Cardiology

## 2019-05-27 ENCOUNTER — Encounter: Payer: Self-pay | Admitting: *Deleted

## 2019-05-27 HISTORY — DX: Morbid (severe) obesity due to excess calories: E66.01

## 2019-05-28 ENCOUNTER — Ambulatory Visit (INDEPENDENT_AMBULATORY_CARE_PROVIDER_SITE_OTHER): Payer: 59 | Admitting: Cardiology

## 2019-05-28 ENCOUNTER — Ambulatory Visit (INDEPENDENT_AMBULATORY_CARE_PROVIDER_SITE_OTHER): Payer: 59

## 2019-05-28 ENCOUNTER — Encounter: Payer: Self-pay | Admitting: Cardiology

## 2019-05-28 ENCOUNTER — Other Ambulatory Visit: Payer: Self-pay

## 2019-05-28 VITALS — BP 120/78 | HR 85 | Ht 63.5 in | Wt 197.0 lb

## 2019-05-28 DIAGNOSIS — I428 Other cardiomyopathies: Secondary | ICD-10-CM | POA: Diagnosis not present

## 2019-05-28 DIAGNOSIS — I10 Essential (primary) hypertension: Secondary | ICD-10-CM | POA: Diagnosis not present

## 2019-05-28 DIAGNOSIS — R002 Palpitations: Secondary | ICD-10-CM

## 2019-05-28 DIAGNOSIS — R0602 Shortness of breath: Secondary | ICD-10-CM

## 2019-05-28 DIAGNOSIS — E782 Mixed hyperlipidemia: Secondary | ICD-10-CM

## 2019-05-28 MED ORDER — TORSEMIDE 20 MG PO TABS: 20.0000 mg | ORAL_TABLET | Freq: Every day | ORAL | 1 refills | Status: DC

## 2019-05-28 NOTE — Patient Instructions (Signed)
Medication Instructions:  Your physician recommends that you continue on your current medications as directed. Please refer to the Current Medication list given to you today.  *If you need a refill on your cardiac medications before your next appointment, please call your pharmacy*  Lab Work: Your physician recommends that you return for lab work in: TODAY BMP,TSH,MAgnesium  If you have labs (blood work) drawn today and your tests are completely normal, you will receive your results only by: Marland Kitchen MyChart Message (if you have MyChart) OR . A paper copy in the mail If you have any lab test that is abnormal or we need to change your treatment, we will call you to review the results.  Testing/Procedures: A zio monitor was ordered today. It will remain on for 14 days. You will then return monitor and event diary in provided box. It takes 1-2 weeks for report to be downloaded and returned to [place]. We will call you with the results. If monitor falls off or has orange flashing light, please call Zio for further instructions.    Follow-Up: At [facility], you and your health needs are our priority.  As part of our continuing mission to provide you with exceptional heart care, we have created designated Provider Care Teams.  These Care Teams include your primary Cardiologist (physician) and Advanced Practice Providers (APPs -  Physician Assistants and Nurse Practitioners) who all work together to provide you with the care you need, when you need it.  Your next appointment:   3 month(s)  The format for your next appointment:   In Person  Provider:   Thomasene Ripple, DO  Other Instructions

## 2019-05-28 NOTE — Progress Notes (Signed)
Cardiology Office Note:    Date:  05/28/2019   ID:  Karen Patrick, DOB 11-15-1965, MRN [id number]  PCP:  Aileen Fass, PA-C  Cardiologist:  Thomasene Ripple, DO  Electrophysiologist:  None   Referring MD: Lynett Fish Christin*   Chief Complaint  Patient presents with  . Establish Care   History of Present Illness:    Karen Patrick is a 54 y.o. 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  Recently she had echocardiogram done which showed improvement of her EF to greater than 50%.  Other medical conditions include diabetes mellitus, hypertension, hyperlipidemia and OSA.  Patient is here today to establish cardiac care.  He tells me that she has ran out of her torsemide due to health insurance change and over the last several weeks she has been experiencing shortness of breath.  She notes that patient today she has been experiencing significant palpitations.  She tells me is mostly an abrupt onset of fast heartbeat last for every minutes prior to resolution.  During this time she is dizzy.  The shortness of breath which is mostly on exertion is getting progressively worse.  She denies any chest pain.  Past Medical History:  Diagnosis Date  . Acute pancreatitis 01/06/2014  . Acute systolic congestive heart failure (HCC)   . 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 Adjustment disorder with depressed mood 06/03/2016  . Ankle fracture, right   . Asthma   . Cardiomyopathy (HCC) 11/06/2017   Non-ischemic November 2018  . Depression   . Diabetes mellitus   . Drug overdose   . Essential hypertension 03/05/2017   Overview:  Added automatically from request for surgery [id number] Added automatically from request for surgery [id number]  . Fracture of right orbital floor with routine healing 10/12/2014  . GERD (gastroesophageal reflux disease)   . History of  hypertension 03/05/2017  . Midline thoracic back pain 03/06/2017  . Myocardial infarction (HCC)   . Obstructive sleep apnea 06/26/2017   11/2017 PSG AHI: 5.4, REM AHI: 25, SpO2 nadir: 86% 04/2018 PAP PSG: CPAP 6cm  01/2019 started CPAP therapy  . Other hyperlipidemia 06/06/2018  . Right ankle injury 11/02/2010  . Schizoaffective disorder (HCC)   . Seasonal allergies 03/30/2013   Overview:  Watery eyes and sinus pain  . Severe obesity (BMI 35.0-39.9) with comorbidity (HCC) 05/27/2019  . Suicidal ideation   . Type 2 diabetes mellitus without complications (HCC) 02/26/2013  . UTI (urinary tract infection), bacterial 01/07/2014    Past Surgical History:  Procedure Laterality Date  . BACK SURGERY    . CESAREAN SECTION    . ENDOMETRIAL ABLATION W/ NOVASURE    . FOOT FASCIOTOMY    . HAMMER TOE SURGERY    . KNEE ARTHROCENTESIS    . LIPOMA EXCISION      Current Medications: Current Meds  Medication Sig  . ascorbic acid (VITAMIN C) 500 MG tablet Take 500 mg by mouth daily.  Marland Kitchen atorvastatin (LIPITOR) 20 MG tablet 20 mg daily.  . carvedilol (COREG) 12.5 MG tablet Take 12.5 mg by mouth 2 (two) times daily.  . Cranberry 400 MG TABS Take 400 mg by mouth daily.  Marland Kitchen estrogen, conjugated,-medroxyprogesterone (PREMPRO) 0.3-1.5 MG tablet Take 1 tablet by mouth daily.  . insulin glargine (LANTUS) 100 UNIT/ML injection Inject into the skin at bedtime.  Boris Lown Oil 1000  MG CAPS Take 1,000 mg by mouth daily.  . Liraglutide (VICTOZA) 18 MG/3ML SOPN Inject 1.8 mg into the skin daily.  Marland Kitchen losartan (COZAAR) 50 MG tablet 75 mg daily.  . metFORMIN (GLUCOPHAGE-XR) 500 MG 24 hr tablet Take 1,000 mg by mouth 2 (two) times daily.  . Multiple Vitamin (MULTIVITAMIN) capsule Take 1 capsule by mouth daily.  . potassium chloride (KLOR-CON) 10 MEQ tablet 10 mEq daily.  Marland Kitchen spironolactone (ALDACTONE) 25 MG tablet 25 mg daily.  Marland Kitchen torsemide (DEMADEX) 20 MG tablet Take 1 tablet (20 mg total) by mouth daily.  . [DISCONTINUED]  torsemide (DEMADEX) 20 MG tablet Take 20 mg by mouth daily.     Allergies:   Patient has no known allergies.   Social History   Socioeconomic History  . Marital status: Divorced    Spouse name: Not on file  . Number of children: Not on file  . Years of education: Not on file  . Highest education level: Not on file  Occupational History  . Not on file  Tobacco Use  . Smoking status: Former Smoker    Types: Cigarettes    Start date: 1983    Quit date: 2006    Years since quitting: 15.0  . Smokeless tobacco: Never Used  Substance and Sexual Activity  . Alcohol use: No  . Drug use: No  . Sexual activity: Yes    Birth control/protection: Surgical  Other Topics Concern  . Not on file  Social History Narrative  . Not on file   Social Determinants of Health   Financial Resource Strain:   . Difficulty of Paying Living Expenses: Not on file  Food Insecurity:   . Worried About Programme researcher, broadcasting/film/video in the Last Year: Not on file  . Ran Out of Food in the Last Year: Not on file  Transportation Needs:   . Lack of Transportation (Medical): Not on file  . Lack of Transportation (Non-Medical): Not on file  Physical Activity:   . Days of Exercise per Week: Not on file  . Minutes of Exercise per Session: Not on file  Stress:   . Feeling of Stress : Not on file  Social Connections:   . Frequency of Communication with Friends and Family: Not on file  . Frequency of Social Gatherings with Friends and Family: Not on file  . Attends Religious Services: Not on file  . Active Member of Clubs or Organizations: Not on file  . Attends Banker Meetings: Not on file  . Marital Status: Not on file     Family History: The patient's family history includes Cancer in her mother and paternal aunt; Diabetes in her father, maternal grandmother, mother, and paternal grandmother; Heart attack in her father and maternal grandmother; Heart failure in her mother; Hyperlipidemia in her  father; Hypertension in her father, maternal grandmother, and mother; Stroke in her father and mother; Sudden death in her father and paternal grandmother.  ROS:   Review of Systems  Constitution: Negative for decreased appetite, fever and weight gain.  HENT: Negative for congestion, ear discharge, hoarse voice and sore throat.   Eyes: Negative for discharge, redness, vision loss in right eye and visual halos.  Cardiovascular: Negative for chest pain, dyspnea on exertion, leg swelling, orthopnea and palpitations.  Respiratory: Negative for cough, hemoptysis, shortness of breath and snoring.   Endocrine: Negative for heat intolerance and polyphagia.  Hematologic/Lymphatic: Negative for bleeding problem. Does not bruise/bleed easily.  Skin: Negative for flushing,  nail changes, rash and suspicious lesions.  Musculoskeletal: Negative for arthritis, joint pain, muscle cramps, myalgias, neck pain and stiffness.  Gastrointestinal: Negative for abdominal pain, bowel incontinence, diarrhea and excessive appetite.  Genitourinary: Negative for decreased libido, genital sores and incomplete emptying.  Neurological: Negative for brief paralysis, focal weakness, headaches and loss of balance.  Psychiatric/Behavioral: Negative for altered mental status, depression and suicidal ideas.  Allergic/Immunologic: Negative for HIV exposure and persistent infections.    EKGs/Labs/Other Studies Reviewed:    The following studies were reviewed today:   EKG:  The ekg ordered today demonstrates is rhythm, heart rate 80 bpm  Transthoracic echocardiogram in May 05 2019 report left ventricular ejection fraction greater than 50%.  Low normal left ventricular systolic function.  Normal left ventricular wall thickness.  Normal left ventricular cavity size.  Trace mitral regurgitation.  Trace tricuspid regurgitation.  Unable to calculate the RV systolic pressure.  Mild pulmonic regurgitation.  Left atrial size was  normal.  Grade 1 diastolic dysfunction.  Left heart catheterization in 2018 Normal left main with 0 stenosis.  LAD with no stenosis.  Left circumflex with no stenosis.  RCA with no stenosis.  Ramus with no stenosis.  EF 35 to 40% based on left ventriculogram.  Recent Labs: 01/25/2019: B Natriuretic Peptide 10.8; BUN 21; Creatinine, Ser 0.69; Hemoglobin 13.7; Platelets 271; Potassium 3.4; Sodium 137  Recent Lipid Panel No results found for: CHOL, TRIG, HDL, CHOLHDL, VLDL, LDLCALC, LDLDIRECT  Physical Exam:    VS:  BP 120/78 (BP Location: Left Arm, Patient Position: Sitting, Cuff Size: Normal)   Pulse 85   Ht 5' 3.5" (1.613 m)   Wt 197 lb (89.4 kg)   SpO2 98%   BMI 34.35 kg/m     Wt Readings from Last 3 Encounters:  05/28/19 197 lb (89.4 kg)  04/06/19 189 lb (85.7 kg)  01/25/19 184 lb (83.5 kg)     GEN: Well nourished, well developed in no acute distress HEENT: Normal NECK: No JVD; No carotid bruits LYMPHATICS: No lymphadenopathy CARDIAC: S1S2 noted,RRR, no murmurs, rubs, gallops RESPIRATORY:  Clear to auscultation without rales, wheezing or rhonchi  ABDOMEN: Soft, non-tender, non-distended, +bowel sounds, no guarding. EXTREMITIES: No edema, No cyanosis, no clubbing MUSCULOSKELETAL:  No deformity  SKIN: Warm and dry NEUROLOGIC:  Alert and oriented x 3, non-focal PSYCHIATRIC:  Normal affect, good insight  ASSESSMENT:    1. Palpitations   2. Shortness of breath   3. Nonischemic cardiomyopathy ([employer])   4. Essential hypertension   5. Mixed hyperlipidemia    PLAN:    1.  For shortness of breath going to restart her torsemide at her previous dose 20 mg daily.  I am hoping that this would improve her symptoms especially in the setting of her grade 1 diastolic dysfunction.     I would like to rule out a cardiovascular etiology of this palpitation, therefore at this time I would like to placed a zio patch for   14 days.  Also other review her echocardiogram which was done on  May 05, 2019 there is no etiology may be contributing to this.  No need to repeat this study.   Nonischemic cardiomyopathy-we will continue patient on her current medication regimen which includes carvedilol 12.5 mg twice daily, losartan milligrams daily, spironolactone 25 mg daily and restart torsemide 20 mg daily.  Hyperlipidemia-continue atorvastatin.  The patient is in agreement with the above plan. The patient left the office in stable condition.  The patient will follow  up in 3 months or sooner if needed   Medication Adjustments/Labs and Tests Ordered: Current medicines are reviewed at length with the patient today.  Concerns regarding medicines are outlined above.  Orders Placed This Encounter  Procedures  . Basic Metabolic Panel (BMET)  . Magnesium  . TSH  . LONG TERM MONITOR (3-14 DAYS)   Meds ordered this encounter  Medications  . torsemide (DEMADEX) 20 MG tablet    Sig: Take 1 tablet (20 mg total) by mouth daily.    Dispense:  90 tablet    Refill:  1    Patient Instructions  Medication Instructions:  Your physician recommends that you continue on your current medications as directed. Please refer to the Current Medication list given to you today.  *If you need a refill on your cardiac medications before your next appointment, please call your pharmacy*  Lab Work: Your physician recommends that you return for lab work in: TODAY BMP,TSH,MAgnesium  If you have labs (blood work) drawn today and your tests are completely normal, you will receive your results only by: Marland Kitchen MyChart Message (if you have MyChart) OR . A paper copy in the mail If you have any lab test that is abnormal or we need to change your treatment, we will call you to review the results.  Testing/Procedures: A zio monitor was ordered today. It will remain on for 14 days. You will then return monitor and event diary in provided box. It takes 1-2 weeks for report to be downloaded and returned to [place]. We  will call you with the results. If monitor falls off or has orange flashing light, please call Zio for further instructions.    Follow-Up: At [facility], you and your health needs are our priority.  As part of our continuing mission to provide you with exceptional heart care, we have created designated Provider Care Teams.  These Care Teams include your primary Cardiologist (physician) and Advanced Practice Providers (APPs -  Physician Assistants and Nurse Practitioners) who all work together to provide you with the care you need, when you need it.  Your next appointment:   3 month(s)  The format for your next appointment:   In Person  Provider:   Thomasene Ripple, DO  Other Instructions      Adopting a Healthy Lifestyle.  Know what a healthy weight is for you (roughly BMI <25) and aim to maintain this   Aim for 7+ servings of fruits and vegetables daily   65-80+ fluid ounces of water or unsweet tea for healthy kidneys   Limit to max 1 drink of alcohol per day; avoid smoking/tobacco   Limit animal fats in diet for cholesterol and heart health - choose grass fed whenever available   Avoid highly processed foods, and foods high in saturated/trans fats   Aim for low stress - take time to unwind and care for your mental health   Aim for 150 min of moderate intensity exercise weekly for heart health, and weights twice weekly for bone health   Aim for 7-9 hours of sleep daily   When it comes to diets, agreement about the perfect plan isnt easy to find, even among the experts. Experts at the [facility] of [employer] developed an idea known as the Healthy Eating Plate. Just imagine a plate divided into logical, healthy portions.   The emphasis is on diet quality:   Load up on vegetables and fruits - one-half of your plate: Aim for color and  variety, and remember that potatoes dont count.   Go for whole grains - one-quarter of your plate: Whole wheat, barley, wheat  berries, quinoa, oats, brown rice, and foods made with them. If you want pasta, go with whole wheat pasta.   Protein power - one-quarter of your plate: Fish, chicken, beans, and nuts are all healthy, versatile protein sources. Limit red meat.   The diet, however, does go beyond the plate, offering a few other suggestions.   Use healthy plant oils, such as olive, canola, soy, corn, sunflower and peanut. Check the labels, and avoid partially hydrogenated oil, which have unhealthy trans fats.   If youre thirsty, drink water. Coffee and tea are good in moderation, but skip sugary drinks and limit milk and dairy products to one or two daily servings.   The type of carbohydrate in the diet is more important than the amount. Some sources of carbohydrates, such as vegetables, fruits, whole grains, and beans-are healthier than others.   Finally, stay active  Signed, Thomasene Ripple, DO  05/28/2019 4:05 PM    [facility]

## 2019-05-29 LAB — BASIC METABOLIC PANEL
BUN/Creatinine Ratio: 21 (ref 9–23)
BUN: 12 mg/dL (ref 6–24)
CO2: 23 mmol/L (ref 20–29)
Calcium: 9.8 mg/dL (ref 8.7–10.2)
Chloride: 102 mmol/L (ref 96–106)
Creatinine, Ser: 0.58 mg/dL (ref 0.57–1.00)
GFR calc Af Amer: 121 mL/min/{1.73_m2} (ref >59)
GFR calc non Af Amer: 105 mL/min/{1.73_m2} (ref >59)
Glucose: 285 mg/dL — ABNORMAL HIGH (ref 65–99)
Potassium: 4.3 mmol/L (ref 3.5–5.2)
Sodium: 137 mmol/L (ref 134–144)

## 2019-05-29 LAB — MAGNESIUM: Magnesium: 1.6 mg/dL (ref 1.6–2.3)

## 2019-05-29 LAB — TSH: TSH: 0.982 u[IU]/mL (ref 0.450–4.500)

## 2019-06-01 ENCOUNTER — Telehealth: Payer: Self-pay | Admitting: *Deleted

## 2019-06-01 NOTE — Telephone Encounter (Signed)
Telephone call to patient. Left message with lab results and to call with any questions. 

## 2019-06-01 NOTE — Telephone Encounter (Signed)
-----   Message from Thomasene Ripple, DO sent at 06/01/2019  8:16 AM EST ----- Blood sugar is elevated, other labs normal

## 2019-06-29 ENCOUNTER — Telehealth: Payer: Self-pay | Admitting: *Deleted

## 2019-06-29 NOTE — Telephone Encounter (Signed)
Spoke with patient. Informed Dr Servando Salina would like to discuss monitor results at an appointment. MAde appointment Fri 07/03/19 at 2:15.

## 2019-07-03 ENCOUNTER — Other Ambulatory Visit: Payer: Self-pay

## 2019-07-03 ENCOUNTER — Encounter: Payer: Self-pay | Admitting: Cardiology

## 2019-07-03 ENCOUNTER — Ambulatory Visit (INDEPENDENT_AMBULATORY_CARE_PROVIDER_SITE_OTHER): Payer: 59 | Admitting: Cardiology

## 2019-07-03 VITALS — BP 100/60 | HR 94 | Ht 63.5 in | Wt 189.0 lb

## 2019-07-03 DIAGNOSIS — I493 Ventricular premature depolarization: Secondary | ICD-10-CM

## 2019-07-03 DIAGNOSIS — I471 Supraventricular tachycardia: Secondary | ICD-10-CM

## 2019-07-03 DIAGNOSIS — I4729 Other ventricular tachycardia: Secondary | ICD-10-CM

## 2019-07-03 DIAGNOSIS — I4719 Other supraventricular tachycardia: Secondary | ICD-10-CM | POA: Insufficient documentation

## 2019-07-03 DIAGNOSIS — I10 Essential (primary) hypertension: Secondary | ICD-10-CM | POA: Diagnosis not present

## 2019-07-03 DIAGNOSIS — G4733 Obstructive sleep apnea (adult) (pediatric): Secondary | ICD-10-CM

## 2019-07-03 DIAGNOSIS — I472 Ventricular tachycardia: Secondary | ICD-10-CM

## 2019-07-03 HISTORY — DX: Supraventricular tachycardia: I47.1

## 2019-07-03 HISTORY — DX: Other supraventricular tachycardia: I47.19

## 2019-07-03 HISTORY — DX: Ventricular premature depolarization: I49.3

## 2019-07-03 HISTORY — DX: Other ventricular tachycardia: I47.29

## 2019-07-03 HISTORY — DX: Ventricular tachycardia: I47.2

## 2019-07-03 MED ORDER — DILTIAZEM HCL ER COATED BEADS 120 MG PO CP24: 120.0000 mg | ORAL_CAPSULE | Freq: Every day | ORAL | 1 refills | Status: DC

## 2019-07-03 NOTE — Progress Notes (Signed)
Cardiology Office Note:    Date:  07/03/2019   ID:  Karen Patrick, DOB 12/21/1965, MRN [id number]  PCP:  Lezlie Octave, PA-C  Cardiologist:  Berniece Salines, DO  Electrophysiologist:  None   Referring MD: Roderic Ovens Christin*   Chief Complaint  Patient presents with  . Follow-up    History of Present Illness:    Karen Patrick is a 54 y.o. female with a hx of diabetes mellitus, hypertension, hyperlipidemia, family history of premature coronary disease (father first MI in his 71s and mother died of MI 17), history of nonischemic cardiomyopathy with a EF of 35 to 40% 2018 and a left heart catheterization which was also normal.   Most recent echocardiogram at the outside facility showed EF greater than 50%.  I saw the patient on May 28, 2019 for palpitations, I recommended she wear the ZIO monitor she is here today for follow-up visit.  She still is experiencing significant palpitations.   Past Medical History:  Diagnosis Date  . Acute pancreatitis 01/06/2014  . Acute systolic congestive heart failure ([place])   . 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 Adjustment disorder with depressed mood 06/03/2016  . Ankle fracture, right   . Asthma   . Cardiomyopathy ([place]) 11/06/2017   Non-ischemic November 2018  . Depression   . Diabetes mellitus   . Drug overdose   . Essential hypertension 03/05/2017   Overview:  Added automatically from request for surgery [id number] Added automatically from request for surgery [phone]  . Fracture of right orbital floor with routine healing 10/12/2014  . GERD (gastroesophageal reflux disease)   . History of hypertension 03/05/2017  . Midline thoracic back pain 03/06/2017  . Myocardial infarction ([place])   . Obstructive sleep apnea 06/26/2017   11/2017 PSG AHI: 5.4, REM AHI: 25, SpO2 nadir: 86% 04/2018 PAP PSG: CPAP 6cm  01/2019 started CPAP therapy  . Other hyperlipidemia 06/06/2018  . Right ankle injury 11/02/2010  .  Schizoaffective disorder ([place])   . Seasonal allergies 03/30/2013   Overview:  Watery eyes and sinus pain  . Severe obesity (BMI 35.0-39.9) with comorbidity ([place]) 05/27/2019  . Suicidal ideation   . Type 2 diabetes mellitus without complications ([place]) 95/62/1308  . UTI (urinary tract infection), bacterial 01/07/2014    Past Surgical History:  Procedure Laterality Date  . BACK SURGERY    . CESAREAN SECTION    . ENDOMETRIAL ABLATION W/ NOVASURE    . FOOT FASCIOTOMY    . HAMMER TOE SURGERY    . KNEE ARTHROCENTESIS    . LIPOMA EXCISION      Current Medications: Current Meds  Medication Sig  . ascorbic acid (VITAMIN C) 500 MG tablet Take 500 mg by mouth daily.  Marland Kitchen atorvastatin (LIPITOR) 20 MG tablet 20 mg daily.  . Cranberry 400 MG TABS Take 400 mg by mouth daily.  Marland Kitchen estrogen, conjugated,-medroxyprogesterone (PREMPRO) 0.3-1.5 MG tablet Take 1 tablet by mouth daily.  . insulin glargine (LANTUS) 100 UNIT/ML injection Inject into the skin at bedtime.  Javier Docker Oil 1000 MG CAPS Take 1,000 mg by mouth daily.  . Liraglutide (VICTOZA) 18 MG/3ML SOPN Inject 1.8 mg into the skin daily.  Marland Kitchen losartan (COZAAR) 50 MG tablet 75 mg daily.  . metFORMIN (GLUCOPHAGE-XR) 500 MG 24 hr tablet Take 1,000 mg by mouth 2 (two) times daily.  . Multiple Vitamin (MULTIVITAMIN) capsule Take 1 capsule by mouth daily.  . nitrofurantoin, macrocrystal-monohydrate, (MACROBID) 100 MG capsule Take 100 mg by  mouth 2 (two) times daily.  . potassium chloride (KLOR-CON) 10 MEQ tablet 10 mEq daily.  Marland Kitchen spironolactone (ALDACTONE) 25 MG tablet 25 mg daily.  Marland Kitchen torsemide (DEMADEX) 20 MG tablet Take 1 tablet (20 mg total) by mouth daily.  . [DISCONTINUED] carvedilol (COREG) 12.5 MG tablet Take 12.5 mg by mouth 2 (two) times daily.     Allergies:   Patient has no known allergies.   Social History   Socioeconomic History  . Marital status: Divorced    Spouse name: Not on file  . Number of children: Not on file  . Years of  education: Not on file  . Highest education level: Not on file  Occupational History  . Not on file  Tobacco Use  . Smoking status: Former Smoker    Types: Cigarettes    Start date: 1983    Quit date: 2006    Years since quitting: 15.1  . Smokeless tobacco: Never Used  Substance and Sexual Activity  . Alcohol use: No  . Drug use: No  . Sexual activity: Yes    Birth control/protection: Surgical  Other Topics Concern  . Not on file  Social History Narrative  . Not on file   Social Determinants of Health   Financial Resource Strain:   . Difficulty of Paying Living Expenses: Not on file  Food Insecurity:   . Worried About Programme researcher, broadcasting/film/video in the Last Year: Not on file  . Ran Out of Food in the Last Year: Not on file  Transportation Needs:   . Lack of Transportation (Medical): Not on file  . Lack of Transportation (Non-Medical): Not on file  Physical Activity:   . Days of Exercise per Week: Not on file  . Minutes of Exercise per Session: Not on file  Stress:   . Feeling of Stress : Not on file  Social Connections:   . Frequency of Communication with Friends and Family: Not on file  . Frequency of Social Gatherings with Friends and Family: Not on file  . Attends Religious Services: Not on file  . Active Member of Clubs or Organizations: Not on file  . Attends Banker Meetings: Not on file  . Marital Status: Not on file     Family History: The patient's family history includes Cancer in her mother and paternal aunt; Diabetes in her father, maternal grandmother, mother, and paternal grandmother; Heart attack in her father and maternal grandmother; Heart failure in her mother; Hyperlipidemia in her father; Hypertension in her father, maternal grandmother, and mother; Stroke in her father and mother; Sudden death in her father and paternal grandmother.  ROS:   Review of Systems  Constitution: Negative for decreased appetite, fever and weight gain.  HENT:  Negative for congestion, ear discharge, hoarse voice and sore throat.   Eyes: Negative for discharge, redness, vision loss in right eye and visual halos.  Cardiovascular: Negative for chest pain, dyspnea on exertion, leg swelling, orthopnea and palpitations.  Respiratory: Negative for cough, hemoptysis, shortness of breath and snoring.   Endocrine: Negative for heat intolerance and polyphagia.  Hematologic/Lymphatic: Negative for bleeding problem. Does not bruise/bleed easily.  Skin: Negative for flushing, nail changes, rash and suspicious lesions.  Musculoskeletal: Negative for arthritis, joint pain, muscle cramps, myalgias, neck pain and stiffness.  Gastrointestinal: Negative for abdominal pain, bowel incontinence, diarrhea and excessive appetite.  Genitourinary: Negative for decreased libido, genital sores and incomplete emptying.  Neurological: Negative for brief paralysis, focal weakness, headaches and loss  of balance.  Psychiatric/Behavioral: Negative for altered mental status, depression and suicidal ideas.  Allergic/Immunologic: Negative for HIV exposure and persistent infections.    EKGs/Labs/Other Studies Reviewed:    The following studies were reviewed today:   EKG: None today  ZIO monitor The patient wore the monitor for 14 days starting 05/28/2019  . Indication:  Palpitations  The minimum heart rate was 67 bpm, maximum heart rate was 184  bpm, and average heart rate was 88 bpm. Predominant underlying rhythm was Sinus Rhythm.  2 Ventricular Tachycardia runs occurred, the run with the fastest interval lasting 8 beats with a maximum heart rate of 184 bpm, the longest lasting 5 beats with an average rate of 108 bpm.    1 run of Supraventricular Tachycardia occurred lasting 6 beats with a maximum heart rate of 124 bpm (average 109 bpm).  Premature atrial complexes were rare (<1%). Premature Ventricular complexes were frequent (6.8%, [id number]). Ventricular Bigeminy and  Trigeminy were present.  12 patient triggered events noted with 9 associated with ventricular ectopy with the remaining associated with sinus rhythm. 8 diary event noted 7 associated with ventricular ectopy and there remaining associated with sinus rhythm.   No pauses, No AV block and no atrial fibrillation present.  Conclusion: This study is remarkable for the following:                             1. 2 runs of nonsustained ventricular tachycardia.                             2. Rare asymptomatic paroxysmal atrial tachycardia with variable block.                             3. Frequent Symptomatic premature ventricular complexes.  Transthoracic echocardiogram in May 05 2019 report left ventricular ejection fraction greater than 50%.  Low normal left ventricular systolic function.  Normal left ventricular wall thickness.  Normal left ventricular cavity size.  Trace mitral regurgitation.  Trace tricuspid regurgitation.  Unable to calculate the RV systolic pressure.  Mild pulmonic regurgitation.  Left atrial size was normal.  Grade 1 diastolic dysfunction.  Recent Labs: 01/25/2019: B Natriuretic Peptide 10.8; Hemoglobin 13.7; Platelets 271 05/28/2019: BUN 12; Creatinine, Ser 0.58; Magnesium 1.6; Potassium 4.3; Sodium 137; TSH 0.982  Recent Lipid Panel No results found for: CHOL, TRIG, HDL, CHOLHDL, VLDL, LDLCALC, LDLDIRECT  Physical Exam:    VS:  BP 100/60 (BP Location: Right Arm, Patient Position: Sitting, Cuff Size: Normal)   Pulse 94   Ht 5' 3.5" (1.613 m)   Wt 189 lb (85.7 kg)   SpO2 96%   BMI 32.95 kg/m     Wt Readings from Last 3 Encounters:  07/03/19 189 lb (85.7 kg)  05/28/19 197 lb (89.4 kg)  04/06/19 189 lb (85.7 kg)     GEN: Well nourished, well developed in no acute distress HEENT: Normal NECK: No JVD; No carotid bruits LYMPHATICS: No lymphadenopathy CARDIAC: S1S2 noted,RRR, no murmurs, rubs, gallops RESPIRATORY:  Clear to auscultation without rales, wheezing or  rhonchi  ABDOMEN: Soft, non-tender, non-distended, +bowel sounds, no guarding. EXTREMITIES: No edema, No cyanosis, no clubbing MUSCULOSKELETAL:  No deformity  SKIN: Warm and dry NEUROLOGIC:  Alert and oriented x 3, non-focal PSYCHIATRIC:  Normal affect, good insight  ASSESSMENT:    1. NSVT (nonsustained ventricular  tachycardia) (HCC)   2. Frequent PVCs   3. PAT (paroxysmal atrial tachycardia) (HCC)   4. Essential hypertension   5. Obstructive sleep apnea    PLAN:    She still is experiencing significant palpitations on her Coreg 12.5 mg twice a day.  I am going to subsequently place the patient on Cardizem 120 mg daily and titrate up as appropriate.  Last left heart catheterization was in 2018 which was normal confirming that her  cardiomyopathy at the time which her EF was 35 to 40% was nonischemic.  Her EF has since improved but now her monitor shows runs of NSVT and frequent PVCs.  At this time to understand her nonsustained ventricular tachycardia I am going to get a cardiac MRI to look for myocardial scarring and any infiltrative disease which may be leading to her arrhythmia.  Hyperlipidemia, continue patient on her Lipitor.  Her EF has improved since May 05, 2019.  Therefore I think is reasonable to change her beta-blocker to calcium channel blocker for better control of her symptoms.  Hypertension-continue patient on losartan 50 mg daily, spironolactone 25 mg daily, torsemide 20 mg daily and now on Cardizem 120 mg daily.  The patient is in agreement with the above plan. The patient left the office in stable condition.  The patient will follow up in 1 month or sooner if needed   Medication Adjustments/Labs and Tests Ordered: Current medicines are reviewed at length with the patient today.  Concerns regarding medicines are outlined above.  No orders of the defined types were placed in this encounter.  No orders of the defined types were placed in this encounter.   There  are no Patient Instructions on file for this visit.   Adopting a Healthy Lifestyle.  Know what a healthy weight is for you (roughly BMI <25) and aim to maintain this   Aim for 7+ servings of fruits and vegetables daily   65-80+ fluid ounces of water or unsweet tea for healthy kidneys   Limit to max 1 drink of alcohol per day; avoid smoking/tobacco   Limit animal fats in diet for cholesterol and heart health - choose grass fed whenever available   Avoid highly processed foods, and foods high in saturated/trans fats   Aim for low stress - take time to unwind and care for your mental health   Aim for 150 min of moderate intensity exercise weekly for heart health, and weights twice weekly for bone health   Aim for 7-9 hours of sleep daily   When it comes to diets, agreement about the perfect plan isnt easy to find, even among the experts. Experts at the [facility] [employer] developed an idea known as the Healthy Eating Plate. Just imagine a plate divided into logical, healthy portions.   The emphasis is on diet quality:   Load up on vegetables and fruits - one-half of your plate: Aim for color and variety, and remember that potatoes dont count.   Go for whole grains - one-quarter of your plate: Whole wheat, barley, wheat berries, quinoa, oats, brown rice, and foods made with them. If you want pasta, go with whole wheat pasta.   Protein power - one-quarter of your plate: Fish, chicken, beans, and nuts are all healthy, versatile protein sources. Limit red meat.   The diet, however, does go beyond the plate, offering a few other suggestions.   Use healthy plant oils, such as olive, canola, soy, corn, sunflower and peanut. Check the  labels, and avoid partially hydrogenated oil, which have unhealthy trans fats.   If youre thirsty, drink water. Coffee and tea are good in moderation, but skip sugary drinks and limit milk and dairy products to one or two daily servings.   The  type of carbohydrate in the diet is more important than the amount. Some sources of carbohydrates, such as vegetables, fruits, whole grains, and beans-are healthier than others.   Finally, stay active  Signed, Thomasene Ripple, DO  07/03/2019 2:49 PM     [facility]

## 2019-07-03 NOTE — Patient Instructions (Signed)
Medication Instructions:  Your physician has recommended you make the following change in your medication:   STOP: COREG  START: Cardizem CD(diltiazem0 120 mg Take 1 tab daily   *If you need a refill on your cardiac medications before your next appointment, please call your pharmacy*   Lab Work: NOne If you have labs (blood work) drawn today and your tests are completely normal, you will receive your results only by: Marland Kitchen MyChart Message (if you have MyChart) OR . A paper copy in the mail If you have any lab test that is abnormal or we need to change your treatment, we will call you to review the results.   Testing/Procedures: Your physician has requested that you have a cardiac MRI. Cardiac MRI uses a computer to create images of your heart as its beating, producing both still and moving pictures of your heart and major blood vessels. For further information please visit [email or web address]. Please follow the instruction sheet given to you today for more information.       Follow-Up: At [facility], you and your health needs are our priority.  As part of our continuing mission to provide you with exceptional heart care, we have created designated Provider Care Teams.  These Care Teams include your primary Cardiologist (physician) and Advanced Practice Providers (APPs -  Physician Assistants and Nurse Practitioners) who all work together to provide you with the care you need, when you need it.  We recommend signing up for the patient portal called "MyChart".  Sign up information is provided on this After Visit Summary.  MyChart is used to connect with patients for Virtual Visits (Telemedicine).  Patients are able to view lab/test results, encounter notes, upcoming appointments, etc.  Non-urgent messages can be sent to your provider as well.   To learn more about what you can do with MyChart, go to [email or web address].    Your next appointment:   1 month(s)  The format for your  next appointment:   In Person  Provider:   Thomasene Ripple, DO   Other Instructions

## 2019-07-09 ENCOUNTER — Encounter: Payer: Self-pay | Admitting: Cardiology

## 2019-07-09 ENCOUNTER — Telehealth: Payer: Self-pay | Admitting: Cardiology

## 2019-07-09 NOTE — Telephone Encounter (Signed)
Left message regarding appointment for Cardiac MRI scheduled 08/12/19 at 8:00 am at [facility]---arrival time is 7:15 am 1st floor radiology department.  Will mail information to patient and it is also in My Chart

## 2019-07-22 ENCOUNTER — Telehealth: Payer: Self-pay | Admitting: *Deleted

## 2019-07-22 NOTE — Telephone Encounter (Signed)
   [facility] Pre-operative Risk Assessment    Request for surgical clearance:  1. What type of surgery is being performed? IMPRESSIONS / SINGLE FILLING / DEEP CLEANING   2. When is this surgery scheduled?   08/10/19  3. What type of clearance is required (medical clearance vs. Pharmacy clearance to hold med vs. Both)?  MEDICAL  4. Are there any medications that need to be held prior to surgery and how long? N/A   5. Practice name and name of physician performing surgery?  [facility]    6. What is your office phone number [phone]    7.   What is your office fax number [phone]  8.   Anesthesia type (None, local, MAC, general) ?  LOCAL   Jeanann Lewandowsky 07/22/2019, 3:48 PM  _________________________________________________________________   (provider comments below)

## 2019-07-22 NOTE — Telephone Encounter (Signed)
   Primary Cardiologist: Thomasene Ripple, DO  Chart reviewed as part of pre-operative protocol coverage. Given past medical history and time since last visit, based on ACC/AHA guidelines, Karen Patrick would be at acceptable risk for the planned procedure without further cardiovascular testing.   She has an RCRI class II risk, 0.9% risk of major cardiac event.  I will route this recommendation to the requesting party via Epic fax function and remove from pre-op pool.  Please call with questions.  Thomasene Ripple. Stanly Si NP-C        [facility] [address] Office [phone] Fax [phone]

## 2019-07-23 ENCOUNTER — Encounter (HOSPITAL_BASED_OUTPATIENT_CLINIC_OR_DEPARTMENT_OTHER): Payer: Self-pay | Admitting: Emergency Medicine

## 2019-07-23 ENCOUNTER — Emergency Department (HOSPITAL_BASED_OUTPATIENT_CLINIC_OR_DEPARTMENT_OTHER)
Admission: EM | Admit: 2019-07-23 | Discharge: 2019-07-23 | Disposition: A | Discharge status: Home / Self Care | Payer: 59 | Attending: Emergency Medicine | Admitting: Emergency Medicine

## 2019-07-23 ENCOUNTER — Other Ambulatory Visit: Payer: Self-pay

## 2019-07-23 ENCOUNTER — Emergency Department (HOSPITAL_BASED_OUTPATIENT_CLINIC_OR_DEPARTMENT_OTHER): Payer: 59

## 2019-07-23 DIAGNOSIS — I10 Essential (primary) hypertension: Secondary | ICD-10-CM | POA: Diagnosis not present

## 2019-07-23 DIAGNOSIS — Z794 Long term (current) use of insulin: Secondary | ICD-10-CM | POA: Insufficient documentation

## 2019-07-23 DIAGNOSIS — Z79899 Other long term (current) drug therapy: Secondary | ICD-10-CM | POA: Insufficient documentation

## 2019-07-23 DIAGNOSIS — Z87891 Personal history of nicotine dependence: Secondary | ICD-10-CM | POA: Diagnosis not present

## 2019-07-23 DIAGNOSIS — R739 Hyperglycemia, unspecified: Secondary | ICD-10-CM

## 2019-07-23 DIAGNOSIS — E1165 Type 2 diabetes mellitus with hyperglycemia: Secondary | ICD-10-CM | POA: Insufficient documentation

## 2019-07-23 LAB — HEPATIC FUNCTION PANEL
ALT: 26 U/L (ref 0–44)
AST: 23 U/L (ref 15–41)
Albumin: 4.2 g/dL (ref 3.5–5.0)
Alkaline Phosphatase: 109 U/L (ref 38–126)
Bilirubin, Direct: 0.1 mg/dL (ref 0.0–0.2)
Indirect Bilirubin: 0.6 mg/dL (ref 0.3–0.9)
Total Bilirubin: 0.7 mg/dL (ref 0.3–1.2)
Total Protein: 8 g/dL (ref 6.5–8.1)

## 2019-07-23 LAB — CBG MONITORING, ED: Glucose-Capillary: 400 mg/dL — ABNORMAL HIGH (ref 70–99)

## 2019-07-23 LAB — BASIC METABOLIC PANEL
Anion gap: 13 (ref 5–15)
BUN: 17 mg/dL (ref 6–20)
CO2: 21 mmol/L — ABNORMAL LOW (ref 22–32)
Calcium: 9.1 mg/dL (ref 8.9–10.3)
Chloride: 100 mmol/L (ref 98–111)
Creatinine, Ser: 0.78 mg/dL (ref 0.44–1.00)
GFR calc Af Amer: >60 mL/min (ref >60)
GFR calc non Af Amer: >60 mL/min (ref >60)
Glucose, Bld: 424 mg/dL — ABNORMAL HIGH (ref 70–99)
Potassium: 4 mmol/L (ref 3.5–5.1)
Sodium: 134 mmol/L — ABNORMAL LOW (ref 135–145)

## 2019-07-23 LAB — CBC WITH DIFFERENTIAL/PLATELET
Abs Immature Granulocytes: 0.02 10*3/uL (ref 0.00–0.07)
Basophils Absolute: 0 10*3/uL (ref 0.0–0.1)
Basophils Relative: 0 %
Eosinophils Absolute: 0.1 10*3/uL (ref 0.0–0.5)
Eosinophils Relative: 1 %
HCT: 40.2 % (ref 36.0–46.0)
Hemoglobin: 13.4 g/dL (ref 12.0–15.0)
Immature Granulocytes: 0 %
Lymphocytes Relative: 45 %
Lymphs Abs: 3.3 10*3/uL (ref 0.7–4.0)
MCH: 29.4 pg (ref 26.0–34.0)
MCHC: 33.3 g/dL (ref 30.0–36.0)
MCV: 88.2 fL (ref 80.0–100.0)
Monocytes Absolute: 0.4 10*3/uL (ref 0.1–1.0)
Monocytes Relative: 6 %
Neutro Abs: 3.5 10*3/uL (ref 1.7–7.7)
Neutrophils Relative %: 48 %
Platelets: 218 10*3/uL (ref 150–400)
RBC: 4.56 MIL/uL (ref 3.87–5.11)
RDW: 12.7 % (ref 11.5–15.5)
WBC: 7.4 10*3/uL (ref 4.0–10.5)
nRBC: 0 % (ref 0.0–0.2)

## 2019-07-23 LAB — URINALYSIS, MICROSCOPIC (REFLEX): RBC / HPF: NONE SEEN RBC/hpf (ref 0–5)

## 2019-07-23 LAB — URINALYSIS, ROUTINE W REFLEX MICROSCOPIC
Bilirubin Urine: NEGATIVE
Glucose, UA: >=500 mg/dL — AB
Hgb urine dipstick: NEGATIVE
Ketones, ur: NEGATIVE mg/dL
Leukocytes,Ua: NEGATIVE
Nitrite: NEGATIVE
Protein, ur: NEGATIVE mg/dL
Specific Gravity, Urine: <1.005 — ABNORMAL LOW (ref 1.005–1.030)
pH: 5.5 (ref 5.0–8.0)

## 2019-07-23 LAB — LIPASE, BLOOD: Lipase: 53 U/L — ABNORMAL HIGH (ref 11–51)

## 2019-07-23 MED ORDER — INSULIN NPH (HUMAN) (ISOPHANE) 100 UNIT/ML ~~LOC~~ SUSP: 8.0000 [IU] | Freq: Two times a day (BID) | SUBCUTANEOUS | 0 refills | Status: DC

## 2019-07-23 MED ORDER — INSULIN ASPART 100 UNIT/ML ~~LOC~~ SOLN
10.0000 [IU] | Freq: Once | SUBCUTANEOUS | Status: AC
  Administered 2019-07-23: 10 [IU] via INTRAVENOUS
  Filled 2019-07-23: qty 1

## 2019-07-23 MED ORDER — SODIUM CHLORIDE 0.9 % IV BOLUS
1000.0000 mL | Freq: Once | INTRAVENOUS | Status: AC
  Administered 2019-07-23: 20:00:00 1000 mL via INTRAVENOUS

## 2019-07-23 NOTE — Progress Notes (Signed)
Dr. Lockie Mola requested assistance with conversion from basal insulin to NPH since the pt can no longer afford their Lantus.   Recommended to send the prescription to [facility] pharmacy since it's only $25 without insurance there.  Dose conversion requires a 20% dose reduction.  Conversion: Lantus 20 units daily to NPH 8 units BID.    Domenic Moras, PharmD PGY1 Ambulatory Care Pharmacy Resident

## 2019-07-23 NOTE — ED Triage Notes (Addendum)
Out of her  Sugar meds x3 weeks , her machine is reading high she states, saw an urgent care and was given rx but it was  To expensive and she could not get them filled, states having cp and  Shaking and body pain

## 2019-07-23 NOTE — Care Management (Signed)
ED CM received call from [facility] concerning patient needing medication assistance. CM spoke with EDP Patient was receiving Patient Assistance with Lantus, but has  recently changed PCP's and has not had her initial appointment yet.  Unfortunately patient does not qualify for [facility] program because she is insured.  Lantus cost to patient was quoted $400 out of pocket patient states she cant afford it.  CM suggested to EDP to switch to a cheaper insulin.  Contacted the [facility] ED Pharmacist who suggest NPH which is $20 at [facility], CM updated patient she states that is doable. EDP has been updated.

## 2019-07-23 NOTE — ED Provider Notes (Signed)
[facility] EMERGENCY DEPARTMENT Provider Note   CSN: [id number] Arrival date & time: 07/23/19  1935     History Chief Complaint  Patient presents with  . Hyperglycemia    Karen Patrick is a 54 y.o. female.  The history is provided by the patient.  Hyperglycemia Severity:  Moderate Onset quality:  Gradual Timing:  Constant Progression:  Unchanged Chronicity:  New Diabetes status:  Unable to specify Current diabetic therapy:  None Context: noncompliance   Relieved by:  Nothing Ineffective treatments:  None tried Associated symptoms: dehydration, fatigue and weakness   Associated symptoms: no abdominal pain, no chest pain, no dysuria, no fever, no shortness of breath and no vomiting   Risk factors: pancreatic disease        Past Medical History:  Diagnosis Date  . Acute pancreatitis 01/06/2014  . Acute systolic congestive heart failure (HCC)   . 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 Adjustment disorder with depressed mood 06/03/2016  . Ankle fracture, right   . Asthma   . Cardiomyopathy (HCC) 11/06/2017   Non-ischemic November 2018  . Depression   . Diabetes mellitus   . Drug overdose   . Essential hypertension 03/05/2017   Overview:  Added automatically from request for surgery [id number] Added automatically from request for surgery [phone]  . Fracture of right orbital floor with routine healing 10/12/2014  . GERD (gastroesophageal reflux disease)   . History of hypertension 03/05/2017  . Midline thoracic back pain 03/06/2017  . Myocardial infarction (HCC)   . Obstructive sleep apnea 06/26/2017   11/2017 PSG AHI: 5.4, REM AHI: 25, SpO2 nadir: 86% 04/2018 PAP PSG: CPAP 6cm  01/2019 started CPAP therapy  . Other hyperlipidemia 06/06/2018  . Right ankle injury 11/02/2010  . Schizoaffective disorder (HCC)   . Seasonal allergies 03/30/2013   Overview:  Watery eyes and sinus pain  . Severe obesity (BMI 35.0-39.9) with comorbidity (HCC)  05/27/2019  . Suicidal ideation   . Type 2 diabetes mellitus without complications (HCC) 02/26/2013  . UTI (urinary tract infection), bacterial 01/07/2014    Patient Active Problem List   Diagnosis Date Noted  . PAT (paroxysmal atrial tachycardia) (HCC) 07/03/2019  . 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 NSVT (nonsustained ventricular tachycardia) (HCC) 07/03/2019  . Severe obesity (BMI 35.0-39.9) with comorbidity (HCC) 05/27/2019  . Other hyperlipidemia 06/06/2018  . 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 Cardiomyopathy (HCC) 11/06/2017  . Obstructive sleep apnea 06/26/2017  . Midline thoracic back pain 03/06/2017  . Essential hypertension 03/05/2017  . History of hypertension 03/05/2017  . Adjustment disorder with depressed mood 06/03/2016  . Drug overdose   . Suicidal ideation   . Fracture of right orbital floor with routine healing 10/12/2014  . UTI (urinary tract infection), bacterial 01/07/2014  . Acute pancreatitis 01/06/2014  . Seasonal allergies 03/30/2013  . Type 2 diabetes mellitus without complications (HCC) 02/26/2013  . Schizoaffective disorder (HCC) 06/04/2012  . Right ankle injury 11/02/2010    Past Surgical History:  Procedure Laterality Date  . BACK SURGERY    . CESAREAN SECTION    . ENDOMETRIAL ABLATION W/ NOVASURE    . FOOT FASCIOTOMY    . HAMMER TOE SURGERY    . KNEE ARTHROCENTESIS    . LIPOMA EXCISION       OB History   No obstetric history on file.     Family History  Problem Relation Age of Onset  . Stroke Mother   .  Diabetes Mother   . Hypertension Mother   . Cancer Mother   . Heart failure Mother   . Stroke Father   . Sudden death Father   . Hypertension Father   . Hyperlipidemia Father   . Heart attack Father   . Diabetes Father   . Diabetes Maternal Grandmother   . Heart attack Maternal Grandmother   . Hypertension Maternal Grandmother   . Diabetes Paternal Grandmother   . Sudden death Paternal  Grandmother   . Cancer Paternal Aunt     Social History   Tobacco Use  . Smoking status: Former Smoker    Types: Cigarettes    Start date: 1983    Quit date: 2006    Years since quitting: 15.2  . Smokeless tobacco: Never Used  Substance Use Topics  . Alcohol use: No  . Drug use: No    Home Medications Prior to Admission medications   Medication Sig Start Date End Date Taking? Authorizing Provider  ascorbic acid (VITAMIN C) 500 MG tablet Take 500 mg by mouth daily.    [provider]  atorvastatin (LIPITOR) 20 MG tablet 20 mg daily. 06/30/18   [provider]  Cranberry 400 MG TABS Take 400 mg by mouth daily.    [provider]  diltiazem (CARDIZEM CD) 120 MG 24 hr capsule Take 1 capsule (120 mg total) by mouth daily. 07/03/19 10/01/19  Tobb, Godfrey Pick, DO  estrogen, conjugated,-medroxyprogesterone (PREMPRO) 0.3-1.5 MG tablet Take 1 tablet by mouth daily. 12/03/14   [provider]  insulin NPH Human (NOVOLIN N) 100 UNIT/ML injection Inject 0.08 mLs (8 Units total) into the skin 2 (two) times daily before a meal. 07/23/19 08/22/19  Haniyyah Sakuma, DO  Krill Oil 1000 MG CAPS Take 1,000 mg by mouth daily.    [provider]  Liraglutide (VICTOZA) 18 MG/3ML SOPN Inject 1.8 mg into the skin daily.    [provider]  losartan (COZAAR) 50 MG tablet 75 mg daily. 05/30/18   [provider]  metFORMIN (GLUCOPHAGE-XR) 500 MG 24 hr tablet Take 1,000 mg by mouth 2 (two) times daily. 03/12/19   [provider]  Multiple Vitamin (MULTIVITAMIN) capsule Take 1 capsule by mouth daily.    [provider]  nitrofurantoin, macrocrystal-monohydrate, (MACROBID) 100 MG capsule Take 100 mg by mouth 2 (two) times daily.    [provider]  potassium chloride (KLOR-CON) 10 MEQ tablet 10 mEq daily. 02/25/19   [provider]  spironolactone (ALDACTONE) 25 MG tablet 25 mg daily. 02/24/19   [provider]  torsemide  (DEMADEX) 20 MG tablet Take 1 tablet (20 mg total) by mouth daily. 05/28/19   Tobb, Godfrey Pick, DO    Allergies    Patient has no known allergies.  Review of Systems   Review of Systems  Constitutional: Positive for fatigue. Negative for chills and fever.  HENT: Negative for ear pain and sore throat.   Eyes: Negative for pain and visual disturbance.  Respiratory: Negative for cough and shortness of breath.   Cardiovascular: Negative for chest pain and palpitations.  Gastrointestinal: Negative for abdominal pain and vomiting.  Genitourinary: Negative for dysuria and hematuria.  Musculoskeletal: Negative for arthralgias and back pain.  Skin: Negative for color change and rash.  Neurological: Positive for weakness. Negative for seizures and syncope.  All other systems reviewed and are negative.   Physical Exam Updated Vital Signs  ED Triage Vitals  Enc Vitals Group     BP 07/23/19 1943 Marland Kitchen)  141/77     Pulse Rate 07/23/19 1943 88     Resp 07/23/19 1943 20     Temp 07/23/19 1943 98.4 F (36.9 C)     Temp src --      SpO2 07/23/19 1943 95 %     Weight 07/23/19 1944 189 lb (85.7 kg)     Height 07/23/19 1944 5\' 2"  (1.575 m)     Head Circumference --      Peak Flow --      Pain Score 07/23/19 1941 9     Pain Loc --      Pain Edu? --      Excl. in GC? --     Physical Exam Vitals and nursing note reviewed.  Constitutional:      General: She is not in acute distress.    Appearance: She is well-developed. She is not ill-appearing.  HENT:     Head: Normocephalic and atraumatic.     Nose: Nose normal.     Mouth/Throat:     Mouth: Mucous membranes are dry.  Eyes:     Extraocular Movements: Extraocular movements intact.     Conjunctiva/sclera: Conjunctivae normal.     Pupils: Pupils are equal, round, and reactive to light.  Cardiovascular:     Rate and Rhythm: Normal rate and regular rhythm.     Pulses: Normal pulses.     Heart sounds: Normal heart sounds. No murmur.  Pulmonary:      Effort: Pulmonary effort is normal. No respiratory distress.     Breath sounds: Normal breath sounds.  Abdominal:     Palpations: Abdomen is soft.     Tenderness: There is no abdominal tenderness.  Musculoskeletal:        General: No tenderness. Normal range of motion.     Cervical back: Neck supple.  Skin:    General: Skin is warm and dry.     Capillary Refill: Capillary refill takes less than 2 seconds.  Neurological:     General: No focal deficit present.     Mental Status: She is alert.  Psychiatric:        Mood and Affect: Mood normal.     ED Results / Procedures / Treatments   Labs (all labs ordered are listed, but only abnormal results are displayed) Labs Reviewed  BASIC METABOLIC PANEL - Abnormal; Notable for the following components:      Result Value   Sodium 134 (*)    CO2 21 (*)    Glucose, Bld 424 (*)    All other components within normal limits  LIPASE, BLOOD - Abnormal; Notable for the following components:   Lipase 53 (*)    All other components within normal limits  URINALYSIS, ROUTINE W REFLEX MICROSCOPIC - Abnormal; Notable for the following components:   APPearance HAZY (*)    Specific Gravity, Urine <1.005 (*)    Glucose, UA >=500 (*)    All other components within normal limits  URINALYSIS, MICROSCOPIC (REFLEX) - Abnormal; Notable for the following components:   Bacteria, UA RARE (*)    All other components within normal limits  CBG MONITORING, ED - Abnormal; Notable for the following components:   Glucose-Capillary 400 (*)    All other components within normal limits  CBC WITH DIFFERENTIAL/PLATELET  HEPATIC FUNCTION PANEL    EKG EKG Interpretation  Date/Time:  Thursday July 23 2019 19:38:45 EDT Ventricular Rate:  91 PR Interval:  156 QRS Duration: 88 QT Interval:  334 QTC  Calculation: 410 R Axis:   20 Text Interpretation: Sinus rhythm with occasional Premature ventricular complexes Nonspecific T wave abnormality Abnormal ECG  Confirmed by Virgina Norfolk [phone]) on 07/23/2019 8:11:11 PM   Radiology DG Chest Portable 1 View  Result Date: 07/23/2019 CLINICAL DATA:  Cough, chest pain EXAM: PORTABLE CHEST 1 VIEW COMPARISON:  04/06/2019 FINDINGS: Heart and mediastinal contours are within normal limits. No focal opacities or effusions. No acute bony abnormality. IMPRESSION: Normal study. Electronically Signed   By: Charlett Nose M.D.   On: 07/23/2019 20:42    Procedures Procedures (including critical care time)  Medications Ordered in ED Medications  sodium chloride 0.9 % bolus 1,000 mL (1,000 mLs Intravenous New Bag/Given 07/23/19 2001)  insulin aspart (novoLOG) injection 10 Units (10 Units Intravenous Given 07/23/19 2048)    ED Course  I have reviewed the triage vital signs and the nursing notes.  Pertinent labs & imaging results that were available during my care of the patient were reviewed by me and considered in my medical decision making (see chart for details).    MDM Rules/Calculators/A&P                      Karen Patrick is a 54 year old female history of diabetes, schizophrenia, high cholesterol, nonischemic cardiomyopathy presents the ED with high blood sugar.  Patient with unremarkable vitals.  No fever.  Clinically appears dry.  Has been unable to afford her medications over the last several weeks.  Recently got insurance and now to establish with new doctor.  Blood sugar has been high at home.  Specifically has not been able to afford her insulin.  Patient with blood sugar in the 400s here.  However not in DKA.  No infectious symptoms.  Will give IV fluids, give IV insulin.  Will talk with case management about finding a cheaper insulin option for her.  Chest x-ray showed no signs of infection.  No significant anemia, electrolyte abnormality other than hyperglycemia.  Patient does not have AKI.  Patient felt better after insulin and IV fluids.  Was able to write prescription for NPH after talking with case  management and pharmacy as this will be the most cost effective route for her.  Discharged in good condition.  Understands return precautions.  This chart was dictated using voice recognition software.  Despite best efforts to proofread,  errors can occur which can change the documentation meaning.    Final Clinical Impression(s) / ED Diagnoses Final diagnoses:  Hyperglycemia    Rx / DC Orders ED Discharge Orders         Ordered    insulin NPH Human (NOVOLIN N) 100 UNIT/ML injection  2 times daily before meals     07/23/19 2217           Virgina Norfolk, DO 07/23/19 2218

## 2019-07-23 NOTE — ED Notes (Signed)
Pt. Is aware that urine is needed

## 2019-08-04 ENCOUNTER — Other Ambulatory Visit: Payer: Self-pay

## 2019-08-05 ENCOUNTER — Encounter: Payer: Self-pay | Admitting: Family Medicine

## 2019-08-05 ENCOUNTER — Ambulatory Visit: Payer: 59 | Admitting: Family Medicine

## 2019-08-05 ENCOUNTER — Other Ambulatory Visit: Payer: Self-pay

## 2019-08-05 VITALS — BP 120/78 | HR 101 | Temp 98.5°F | Ht 62.5 in | Wt 190.4 lb

## 2019-08-05 DIAGNOSIS — E1169 Type 2 diabetes mellitus with other specified complication: Secondary | ICD-10-CM

## 2019-08-05 DIAGNOSIS — E669 Obesity, unspecified: Secondary | ICD-10-CM

## 2019-08-05 LAB — COMPREHENSIVE METABOLIC PANEL
ALT: 21 U/L (ref 0–35)
AST: 14 U/L (ref 0–37)
Albumin: 4.6 g/dL (ref 3.5–5.2)
Alkaline Phosphatase: 75 U/L (ref 39–117)
BUN: 18 mg/dL (ref 6–23)
CO2: 29 mEq/L (ref 19–32)
Calcium: 10 mg/dL (ref 8.4–10.5)
Chloride: 100 mEq/L (ref 96–112)
Creatinine, Ser: 0.65 mg/dL (ref 0.40–1.20)
GFR: 114.82 mL/min (ref >60.00)
Glucose, Bld: 168 mg/dL — ABNORMAL HIGH (ref 70–99)
Potassium: 3.7 mEq/L (ref 3.5–5.1)
Sodium: 137 mEq/L (ref 135–145)
Total Bilirubin: 0.5 mg/dL (ref 0.2–1.2)
Total Protein: 7.4 g/dL (ref 6.0–8.3)

## 2019-08-05 LAB — MICROALBUMIN / CREATININE URINE RATIO
Creatinine,U: 12.9 mg/dL
Microalb Creat Ratio: 5.4 mg/g (ref 0.0–30.0)
Microalb, Ur: <0.7 mg/dL (ref 0.0–1.9)

## 2019-08-05 LAB — HEMOGLOBIN A1C: Hgb A1c MFr Bld: 10.1 % — ABNORMAL HIGH (ref 4.6–6.5)

## 2019-08-05 MED ORDER — INSULIN NPH (HUMAN) (ISOPHANE) 100 UNIT/ML ~~LOC~~ SUSP: 10.0000 [IU] | Freq: Two times a day (BID) | SUBCUTANEOUS | 0 refills | Status: DC

## 2019-08-05 NOTE — Patient Instructions (Addendum)
Call Center for [facility] at [facility] at [phone] for an appointment.  They are located at [address], [address], [place], [place], [address] (right across the hall from our office).  Take 10 units twice daily of your Novolin until we can get you back on your medications.  Fill out the PAP paperwork and get it to our office.  Give [place] 2-3 business days to get the results of your labs back.   Let us know if you need anything.

## 2019-08-05 NOTE — Progress Notes (Signed)
Chief Complaint  Patient presents with  . New Patient (Initial Visit)       New Patient Visit SUBJECTIVE: HPI: Karen Patrick is an 54 y.o.female who is being seen for establishing care.  DM II Sugars have been running in mid-high 100's.  She is normally on Lantus nightly in addition to Victoza and Metformin.  She has been unable to afford Lantus and Victoza due to changing offices and insurance issues.  She is going to fill out the patient assistance program paperwork and have [place] fill out the rest.  Last A1c was 8 in November 2020.  Diet is been much healthier.  She is exercising routinely now.  She was having no side effects with her medication.  Past Medical History:  Diagnosis Date  . Acute pancreatitis 01/06/2014  . Acute systolic congestive heart failure (HCC)   . 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 Adjustment disorder with depressed mood 06/03/2016  . Ankle fracture, right   . Asthma   . Cardiomyopathy (HCC) 11/06/2017   Non-ischemic November 2018  . Depression   . Diabetes mellitus   . Drug overdose   . Essential hypertension 03/05/2017   Overview:  Added automatically from request for surgery [id number] Added automatically from request for surgery [phone]  . Fracture of right orbital floor with routine healing 10/12/2014  . GERD (gastroesophageal reflux disease)   . History of hypertension 03/05/2017  . Midline thoracic back pain 03/06/2017  . Myocardial infarction (HCC)   . Obstructive sleep apnea 06/26/2017   11/2017 PSG AHI: 5.4, REM AHI: 25, SpO2 nadir: 86% 04/2018 PAP PSG: CPAP 6cm  01/2019 started CPAP therapy  . Other hyperlipidemia 06/06/2018  . Right ankle injury 11/02/2010  . Schizoaffective disorder (HCC)   . Seasonal allergies 03/30/2013   Overview:  Watery eyes and sinus pain  . Severe obesity (BMI 35.0-39.9) with comorbidity (HCC) 05/27/2019  . Suicidal ideation   . Type 2 diabetes mellitus without complications (HCC) 02/26/2013  . UTI  (urinary tract infection), bacterial 01/07/2014   Past Surgical History:  Procedure Laterality Date  . BACK SURGERY    . CESAREAN SECTION    . ENDOMETRIAL ABLATION W/ NOVASURE    . FOOT FASCIOTOMY    . HAMMER TOE SURGERY    . KNEE ARTHROCENTESIS    . LIPOMA EXCISION     Family History  Problem Relation Age of Onset  . Stroke Mother   . Diabetes Mother   . Hypertension Mother   . Cancer Mother   . Heart failure Mother   . Stroke Father   . Sudden death Father   . Hypertension Father   . Hyperlipidemia Father   . Heart attack Father   . Diabetes Father   . Diabetes Maternal Grandmother   . Heart attack Maternal Grandmother   . Hypertension Maternal Grandmother   . Diabetes Paternal Grandmother   . Sudden death Paternal Grandmother   . Cancer Paternal Aunt    No Known Allergies  Current Outpatient Medications:  .  ascorbic acid (VITAMIN C) 500 MG tablet, Take 500 mg by mouth daily., Disp: , Rfl:  .  atorvastatin (LIPITOR) 20 MG tablet, 20 mg daily., Disp: , Rfl:  .  Cranberry 400 MG TABS, Take 400 mg by mouth daily., Disp: , Rfl:  .  diltiazem (CARDIZEM CD) 120 MG 24 hr capsule, Take 1 capsule (120 mg total) by mouth daily., Disp: 90 capsule, Rfl: 1 .  estrogen, conjugated,-medroxyprogesterone (PREMPRO) 0.3-1.5  MG tablet, Take 1 tablet by mouth daily., Disp: , Rfl:  .  insulin NPH Human (NOVOLIN N) 100 UNIT/ML injection, Inject 0.1 mLs (10 Units total) into the skin 2 (two) times daily before a meal., Disp: 6 mL, Rfl: 0 .  Krill Oil 1000 MG CAPS, Take 1,000 mg by mouth daily., Disp: , Rfl:  .  Liraglutide (VICTOZA) 18 MG/3ML SOPN, Inject 1.8 mg into the skin daily., Disp: , Rfl:  .  losartan (COZAAR) 50 MG tablet, 75 mg daily., Disp: , Rfl:  .  metFORMIN (GLUCOPHAGE-XR) 500 MG 24 hr tablet, Take 1,000 mg by mouth 2 (two) times daily., Disp: , Rfl:  .  Multiple Vitamin (MULTIVITAMIN) capsule, Take 1 capsule by mouth daily., Disp: , Rfl:  .  potassium chloride (KLOR-CON) 10  MEQ tablet, 10 mEq daily., Disp: , Rfl:  .  spironolactone (ALDACTONE) 25 MG tablet, 25 mg daily., Disp: , Rfl:  .  torsemide (DEMADEX) 20 MG tablet, Take 1 tablet (20 mg total) by mouth daily., Disp: 90 tablet, Rfl: 1  OBJECTIVE: BP 120/78 (BP Location: Left Arm, Patient Position: Sitting, Cuff Size: Normal)   Pulse (!) 101   Temp 98.5 F (36.9 C) (Temporal)   Ht 5' 2.5" (1.588 m)   Wt 190 lb 6 oz (86.4 kg)   SpO2 97%   BMI 34.27 kg/m  General:  well developed, well nourished, in no apparent distress Skin:  no significant lesions on bilateral feet Nose:  nares patent, septum midline, mucosa normal Throat/Pharynx:  lips and gingiva without lesion; tongue and uvula midline; non-inflamed pharynx; no exudates or postnasal drainage Lungs:  clear to auscultation, breath sounds equal bilaterally, no respiratory distress Cardio:  regular rate and rhythm, no LE edema or bruits Musculoskeletal:  symmetrical muscle groups noted without atrophy or deformity Neuro:  gait normal, sensation intact to pinprick bilateral feet Psych: well oriented with normal range of affect and appropriate judgment/insight  ASSESSMENT/PLAN: Diabetes mellitus type 2 in obese (HCC) - Plan: Microalbumin / creatinine urine ratio, Hemoglobin A1c, Comprehensive metabolic panel  Check above.  We will try to get her regular medicines back through patient assistance.  She will bring [place] the form when she completes her portion.  While we await approval, will increase her dose of Novolin from 8 units twice daily to 10 units twice daily.  Counseled on diet and exercise. She has an eye exam coming up in the next 1-2 weeks. Patient should return pending the above lab results. The patient voiced understanding and agreement to the plan.   The [place], DO 08/05/19  2:22 PM

## 2019-08-11 ENCOUNTER — Encounter (HOSPITAL_COMMUNITY): Payer: Self-pay

## 2019-08-11 ENCOUNTER — Telehealth (HOSPITAL_COMMUNITY): Payer: Self-pay | Admitting: Emergency Medicine

## 2019-08-11 MED ORDER — ALPRAZOLAM 0.25 MG PO TABS: 0.5000 mg | ORAL_TABLET | Freq: Once | ORAL | 0 refills | Status: AC

## 2019-08-11 NOTE — Telephone Encounter (Signed)
Reaching out to patient to offer assistance regarding upcoming cardiac imaging study; pt verbalizes understanding of appt date/time, parking situation and where to check in, and verified current allergies; name and call back number provided for further questions should they arise Rockwell Alexandria RN Navigator Cardiac Imaging [facility] [phone] office [phone] cell  Pt reporting extreme claustrophobia, verbal order obtained from Tobb for 0.5mg  xanax one time dose for 30-49mins prior to test. (phone in to patients preferred pharmacy)  [place] Dec

## 2019-08-12 ENCOUNTER — Ambulatory Visit (HOSPITAL_COMMUNITY)
Admission: RE | Admit: 2019-08-12 | Discharge: 2019-08-12 | Disposition: A | Discharge status: Home / Self Care | Payer: 59 | Source: Ambulatory Visit | Attending: Cardiology | Admitting: Cardiology

## 2019-08-12 ENCOUNTER — Other Ambulatory Visit: Payer: Self-pay

## 2019-08-12 ENCOUNTER — Encounter (HOSPITAL_COMMUNITY): Payer: Self-pay

## 2019-08-12 DIAGNOSIS — I4729 Other ventricular tachycardia: Secondary | ICD-10-CM

## 2019-08-12 DIAGNOSIS — I472 Ventricular tachycardia: Secondary | ICD-10-CM

## 2019-08-14 ENCOUNTER — Telehealth: Payer: Self-pay | Admitting: Family Medicine

## 2019-08-14 NOTE — Telephone Encounter (Signed)
error 

## 2019-08-19 ENCOUNTER — Telehealth: Payer: Self-pay | Admitting: Family Medicine

## 2019-08-19 NOTE — Telephone Encounter (Signed)
Faxed Patient assistant forms to [employer] (lantus), [employer] (prempro)  and [employer] (Victoza) for patients medications. Received fax confirmations on all forms. Patient informed.

## 2019-08-24 ENCOUNTER — Ambulatory Visit: Payer: 59 | Admitting: Cardiology

## 2019-08-24 NOTE — Telephone Encounter (Signed)
Received today approval from [employer] that the patient has been approved for Prempro on their program until 08/19/2020. Produce will be shipped to our office. Prempro #[id number] Called the patient to inform of approval/will let her know when we received this medication

## 2019-08-26 ENCOUNTER — Ambulatory Visit (INDEPENDENT_AMBULATORY_CARE_PROVIDER_SITE_OTHER): Payer: 59 | Admitting: Cardiology

## 2019-08-26 ENCOUNTER — Other Ambulatory Visit: Payer: Self-pay

## 2019-08-26 ENCOUNTER — Encounter: Payer: Self-pay | Admitting: Cardiology

## 2019-08-26 VITALS — BP 132/88 | HR 88 | Ht 62.5 in | Wt 194.0 lb

## 2019-08-26 DIAGNOSIS — I471 Supraventricular tachycardia: Secondary | ICD-10-CM | POA: Diagnosis not present

## 2019-08-26 DIAGNOSIS — I10 Essential (primary) hypertension: Secondary | ICD-10-CM | POA: Diagnosis not present

## 2019-08-26 DIAGNOSIS — I428 Other cardiomyopathies: Secondary | ICD-10-CM | POA: Insufficient documentation

## 2019-08-26 DIAGNOSIS — I493 Ventricular premature depolarization: Secondary | ICD-10-CM

## 2019-08-26 DIAGNOSIS — E782 Mixed hyperlipidemia: Secondary | ICD-10-CM | POA: Insufficient documentation

## 2019-08-26 DIAGNOSIS — E669 Obesity, unspecified: Secondary | ICD-10-CM

## 2019-08-26 DIAGNOSIS — I4729 Other ventricular tachycardia: Secondary | ICD-10-CM

## 2019-08-26 DIAGNOSIS — I472 Ventricular tachycardia: Secondary | ICD-10-CM | POA: Diagnosis not present

## 2019-08-26 HISTORY — DX: Mixed hyperlipidemia: E78.2

## 2019-08-26 HISTORY — DX: Obesity, unspecified: E66.9

## 2019-08-26 HISTORY — DX: Other cardiomyopathies: I42.8

## 2019-08-26 NOTE — Patient Instructions (Signed)
Medication Instructions:  Your physician recommends that you continue on your current medications as directed. Please refer to the Current Medication list given to you today.  *If you need a refill on your cardiac medications before your next appointment, please call your pharmacy*   Lab Work: None.  If you have labs (blood work) drawn today and your tests are completely normal, you will receive your results only by: Marland Kitchen MyChart Message (if you have MyChart) OR . A paper copy in the mail If you have any lab test that is abnormal or we need to change your treatment, we will call you to review the results.   Testing/Procedures: None.    Follow-Up: At [facility], you and your health needs are our priority.  As part of our continuing mission to provide you with exceptional heart care, we have created designated Provider Care Teams.  These Care Teams include your primary Cardiologist (physician) and Advanced Practice Providers (APPs -  Physician Assistants and Nurse Practitioners) who all work together to provide you with the care you need, when you need it.  We recommend signing up for the patient portal called "MyChart".  Sign up information is provided on this After Visit Summary.  MyChart is used to connect with patients for Virtual Visits (Telemedicine).  Patients are able to view lab/test results, encounter notes, upcoming appointments, etc.  Non-urgent messages can be sent to your provider as well.   To learn more about what you can do with MyChart, go to [email or web address].    Your next appointment:   3 month(s)  The format for your next appointment:   In Person  Provider:   Thomasene Ripple, DO   Other Instructions  Dr. Servando Salina has referred you to see electrophysiology.

## 2019-08-26 NOTE — Progress Notes (Signed)
Cardiology Office Note:    Date:  08/26/2019   ID:  Karen Patrick, DOB 07/16/1965, MRN [id number]  PCP:  Shelda Pal, DO  Cardiologist:  Berniece Salines, DO  Electrophysiologist:  None   Referring MD: Roderic Ovens Christin*   Chief Complaint  Patient presents with  . Follow-up    History of Present Illness:    Karen Patrick is a 54 y.o. female with a hx of diabetes mellitus, hypertension, hyperlipidemia, family history of premature coronary disease (father first MI in his 52s and mother died of MI 59), history of nonischemic cardiomyopathy with a EF of 35 to 40% 2018 and a left heart catheterization which was also normal.   Most recent echocardiogram at the outside facility showed EF greater than 50%.  I saw the patient on May 28, 2019 for palpitations, I recommended she wear the ZIO monitor.  I did see her March 5,2021 at which time I discussed her monitor results with her which showed frequent PVCs, 2 runs of NSVT I therefore stop the carvedilol and started patient on Cardizem 120 mg daily.  In addition I recommended a cardiac MRI to understand if there is any myocardial scarring and infiltrative diseases that may be leading to her arrhythmia.  In the meantime the patient attempted to get the MRI but due to significant claustrophobia she was unsuccessful and does not plan to have this testing done again.  She is here for follow-up visit.  She feels a lot better with the change in medication.  No complaints at this time  Past Medical History:  Diagnosis Date  . Acute pancreatitis 01/06/2014  . Acute systolic congestive heart failure ([place])   . 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 Adjustment disorder with depressed mood 06/03/2016  . Ankle fracture, right   . Asthma   . Cardiomyopathy ([place]) 11/06/2017   Non-ischemic November 2018  . Depression   . Diabetes mellitus   . Drug overdose   . Essential hypertension 03/05/2017   Overview:   Added automatically from request for surgery [id number] Added automatically from request for surgery [phone]  . Fracture of right orbital floor with routine healing 10/12/2014  . 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 GERD (gastroesophageal reflux disease)   . History of hypertension 03/05/2017  . Midline thoracic back pain 03/06/2017  . Myocardial infarction ([place])   . NSVT (nonsustained ventricular tachycardia) ([place]) 07/03/2019  . Obstructive sleep apnea 06/26/2017   11/2017 PSG AHI: 5.4, REM AHI: 25, SpO2 nadir: 86% 04/2018 PAP PSG: CPAP 6cm  01/2019 started CPAP therapy  . Other hyperlipidemia 06/06/2018  . PAT (paroxysmal atrial tachycardia) ([place]) 07/03/2019  . Right ankle injury 11/02/2010  . Schizoaffective disorder ([place])   . Seasonal allergies 03/30/2013   Overview:  Watery eyes and sinus pain  . Severe obesity (BMI 35.0-39.9) with comorbidity ([place]) 05/27/2019  . Suicidal ideation   . Type 2 diabetes mellitus without complications ([place]) 54/62/7035  . UTI (urinary tract infection), bacterial 01/07/2014    Past Surgical History:  Procedure Laterality Date  . BACK SURGERY    . CESAREAN SECTION    . ENDOMETRIAL ABLATION W/ NOVASURE    . FOOT FASCIOTOMY    . HAMMER TOE SURGERY    . KNEE ARTHROCENTESIS    . LIPOMA EXCISION      Current Medications: Current Meds  Medication Sig  . ascorbic acid (VITAMIN C) 500 MG tablet Take 500 mg by mouth daily.  Marland Kitchen atorvastatin (LIPITOR) 20 MG  tablet 20 mg daily.  . Cranberry 400 MG TABS Take 400 mg by mouth daily.  Marland Kitchen diltiazem (CARDIZEM CD) 120 MG 24 hr capsule Take 1 capsule (120 mg total) by mouth daily.  Marland Kitchen estrogen, conjugated,-medroxyprogesterone (PREMPRO) 0.3-1.5 MG tablet Take 1 tablet by mouth daily.  . insulin glargine (LANTUS SOLOSTAR) 100 UNIT/ML Solostar Pen Inject 20 Units into the skin daily.  . insulin NPH Human (NOVOLIN N) 100 UNIT/ML injection Inject 0.1 mLs (10 Units total) into the skin 2 (two) times daily before a meal.  . Krill Oil 1000 MG CAPS Take  1,000 mg by mouth daily.  . Liraglutide (VICTOZA) 18 MG/3ML SOPN Inject 1.8 mg into the skin daily.  Marland Kitchen losartan (COZAAR) 50 MG tablet 75 mg daily.  . metFORMIN (GLUCOPHAGE-XR) 500 MG 24 hr tablet Take 1,000 mg by mouth 2 (two) times daily.  . Multiple Vitamin (MULTIVITAMIN) capsule Take 1 capsule by mouth daily.  . potassium chloride (KLOR-CON) 10 MEQ tablet 10 mEq daily.  Marland Kitchen spironolactone (ALDACTONE) 25 MG tablet 25 mg daily.  Marland Kitchen torsemide (DEMADEX) 20 MG tablet Take 1 tablet (20 mg total) by mouth daily.     Allergies:   Patient has no known allergies.   Social History   Socioeconomic History  . Marital status: Divorced    Spouse name: Not on file  . Number of children: Not on file  . Years of education: Not on file  . Highest education level: Not on file  Occupational History  . Not on file  Tobacco Use  . Smoking status: Former Smoker    Types: Cigarettes    Start date: 1983    Quit date: 2006    Years since quitting: 15.3  . Smokeless tobacco: Never Used  Substance and Sexual Activity  . Alcohol use: No  . Drug use: No  . Sexual activity: Yes    Birth control/protection: Surgical  Other Topics Concern  . Not on file  Social History Narrative  . Not on file   Social Determinants of Health   Financial Resource Strain:   . Difficulty of Paying Living Expenses:   Food Insecurity:   . Worried About Programme researcher, broadcasting/film/video in the Last Year:   . Barista in the Last Year:   Transportation Needs:   . Freight forwarder (Medical):   Marland Kitchen Lack of Transportation (Non-Medical):   Physical Activity:   . Days of Exercise per Week:   . Minutes of Exercise per Session:   Stress:   . Feeling of Stress :   Social Connections:   . Frequency of Communication with Friends and Family:   . Frequency of Social Gatherings with Friends and Family:   . Attends Religious Services:   . Active Member of Clubs or Organizations:   . Attends Banker Meetings:   Marland Kitchen  Marital Status:      Family History: The patient's family history includes Cancer in her mother and paternal aunt; Diabetes in her father, maternal grandmother, mother, and paternal grandmother; Heart attack in her father and maternal grandmother; Heart failure in her mother; Hyperlipidemia in her father; Hypertension in her father, maternal grandmother, and mother; Stroke in her father and mother; Sudden death in her father and paternal grandmother.  ROS:   Review of Systems  Constitution: Negative for decreased appetite, fever and weight gain.  HENT: Negative for congestion, ear discharge, hoarse voice and sore throat.   Eyes: Negative for discharge, redness, vision loss  in right eye and visual halos.  Cardiovascular: Negative for chest pain, dyspnea on exertion, leg swelling, orthopnea and palpitations.  Respiratory: Negative for cough, hemoptysis, shortness of breath and snoring.   Endocrine: Negative for heat intolerance and polyphagia.  Hematologic/Lymphatic: Negative for bleeding problem. Does not bruise/bleed easily.  Skin: Negative for flushing, nail changes, rash and suspicious lesions.  Musculoskeletal: Negative for arthritis, joint pain, muscle cramps, myalgias, neck pain and stiffness.  Gastrointestinal: Negative for abdominal pain, bowel incontinence, diarrhea and excessive appetite.  Genitourinary: Negative for decreased libido, genital sores and incomplete emptying.  Neurological: Negative for brief paralysis, focal weakness, headaches and loss of balance.  Psychiatric/Behavioral: Negative for altered mental status, depression and suicidal ideas.  Allergic/Immunologic: Negative for HIV exposure and persistent infections.    EKGs/Labs/Other Studies Reviewed:    The following studies were reviewed today:   EKG: None today  ZIO monitor The patient wore the monitor for 14days starting 05/28/2019. Indication: Palpitations  The minimum heart rate was 67 bpm, maximum  heart rate was 184bpm, and average heart rate was88bpm. Predominant underlying rhythm was Sinus Rhythm.  2 Ventricular Tachycardia runs occurred, the run with the fastest interval lasting 8 beats with a maximum heart rate of 184 bpm, the longest lasting 5 beats with an average rate of 108 bpm.   1 run of Supraventricular Tachycardia occurred lasting 6 beats with a maximum heart rate of 124 bpm (average 109 bpm).  Premature atrial complexes were rare (<1%). Premature Ventricular complexeswere frequent (6.8%, [id number]). Ventricular Bigeminy and Trigeminy were present.  12 patient triggered eventsnoted with 9 associated with ventricular ectopy with the remaining associated with sinus rhythm. 8 diary event noted 7 associated with ventricular ectopy and there remaining associated with sinus rhythm.   No pauses, No AV block and no atrial fibrillation present.  Conclusion: This study isremarkable for the following:  2 runs of nonsustained ventricular tachycardia.  Rare asymptomatic paroxysmal atrial tachycardia with variable block.  Frequent Symptomatic premature ventricular complexes.  Transthoracic echocardiogram in May 05 2019 report left ventricular ejection fraction greater than 50%. Low normal left ventricular systolic function. Normal left ventricular wall thickness. Normal left ventricular cavity size. Trace mitral regurgitation. Trace tricuspid regurgitation. Unable to calculate the RV systolic pressure. Mild pulmonic regurgitation. Left atrial size was normal. Grade 1 diastolic dysfunction.  Recent Labs: 01/25/2019: B Natriuretic Peptide 10.8 05/28/2019: Magnesium 1.6; TSH 0.982 07/23/2019: Hemoglobin 13.4; Platelets 218 08/05/2019: ALT 21; BUN 18; Creatinine, Ser 0.65; Potassium 3.7; Sodium 137  Recent Lipid Panel No results found for: CHOL, TRIG, HDL, CHOLHDL, VLDL, LDLCALC, LDLDIRECT  Physical  Exam:    VS:  BP 132/88   Pulse 88   Ht 5' 2.5" (1.588 m)   Wt 194 lb (88 kg)   SpO2 96%   BMI 34.92 kg/m     Wt Readings from Last 3 Encounters:  08/26/19 194 lb (88 kg)  08/05/19 190 lb 6 oz (86.4 kg)  07/23/19 189 lb (85.7 kg)     GEN: Well nourished, well developed in no acute distress HEENT: Normal NECK: No JVD; No carotid bruits LYMPHATICS: No lymphadenopathy CARDIAC: S1S2 noted,RRR, no murmurs, rubs, gallops RESPIRATORY:  Clear to auscultation without rales, wheezing or rhonchi  ABDOMEN: Soft, non-tender, non-distended, +bowel sounds, no guarding. EXTREMITIES: No edema, No cyanosis, no clubbing MUSCULOSKELETAL:  No deformity  SKIN: Warm and dry NEUROLOGIC:  Alert and oriented x 3, non-focal PSYCHIATRIC:  Normal affect, good insight  ASSESSMENT:    1. Frequent PVCs  2. Nonsustained ventricular tachycardia (HCC)   3. Essential hypertension   4. PAT (paroxysmal atrial tachycardia) (H[facility])   5. Nonischemic cardiomyopathy (H[facility])   6. Mixed hyperlipidemia   7. Obesity (BMI 30-39.9)    PLAN:     Clinically her symptoms are improved, we will continue the Cardizem 120 mg daily.  Unfortunately, getting the cardiac MRI was unsuccessful.  For now she may refer her to EP to assess her for concern of RVOT PVCs and if need for possible antiarrhythmics or ablation.  Hypertension her blood pressure is acceptable in the office today.  No changes will be made with antihypertensive medication.  Her EF in January was 50% we will continue to monitor.  Obesity-the patient understands the need to lose weight with diet and exercise. We have discussed specific strategies for this.  The patient is in agreement with the above plan. The patient left the office in stable condition.  The patient will follow up in 3 months or sooner if needed.   Medication Adjustments/Labs and Tests Ordered: Current medicines are reviewed at length with the patient today.  Concerns regarding medicines are  outlined above.  Orders Placed This Encounter  Procedures  . Ambulatory referral to Cardiac Electrophysiology   No orders of the defined types were placed in this encounter.   Patient Instructions  Medication Instructions:  Your physician recommends that you continue on your current medications as directed. Please refer to the Current Medication list given to you today.  *If you need a refill on your cardiac medications before your next appointment, please call your pharmacy*   Lab Work: None.  If you have labs (blood work) drawn today and your tests are completely normal, you will receive your results only by: Marland Kitchen MyChart Message (if you have MyChart) OR . A paper copy in the mail If you have any lab test that is abnormal or we need to change your treatment, we will call you to review the results.   Testing/Procedures: None.    Follow-Up: At [facility], you and your health needs are our priority.  As part of our continuing mission to provide you with exceptional heart care, we have created designated Provider Care Teams.  These Care Teams include your primary Cardiologist (physician) and Advanced Practice Providers (APPs -  Physician Assistants and Nurse Practitioners) who all work together to provide you with the care you need, when you need it.  We recommend signing up for the patient portal called "MyChart".  Sign up information is provided on this After Visit Summary.  MyChart is used to connect with patients for Virtual Visits (Telemedicine).  Patients are able to view lab/test results, encounter notes, upcoming appointments, etc.  Non-urgent messages can be sent to your provider as well.   To learn more about what you can do with MyChart, go to [email or web address].    Your next appointment:   3 month(s)  The format for your next appointment:   In Person  Provider:   Thomasene Ripple, DO   Other Instructions  Dr. Servando Salina has referred you to see electrophysiology.       Adopting a Healthy Lifestyle.  Know what a healthy weight is for you (roughly BMI <25) and aim to maintain this   Aim for 7+ servings of fruits and vegetables daily   65-80+ fluid ounces of water or unsweet tea for healthy kidneys   Limit to max 1 drink of alcohol per day; avoid smoking/tobacco   Limit animal fats in  diet for cholesterol and heart health - choose grass fed whenever available   Avoid highly processed foods, and foods high in saturated/trans fats   Aim for low stress - take time to unwind and care for your mental health   Aim for 150 min of moderate intensity exercise weekly for heart health, and weights twice weekly for bone health   Aim for 7-9 hours of sleep daily   When it comes to diets, agreement about the perfect plan isnt easy to find, even among the experts. Experts at the [facility] [employer] developed an idea known as the Healthy Eating Plate. Just imagine a plate divided into logical, healthy portions.   The emphasis is on diet quality:   Load up on vegetables and fruits - one-half of your plate: Aim for color and variety, and remember that potatoes dont count.   Go for whole grains - one-quarter of your plate: Whole wheat, barley, wheat berries, quinoa, oats, brown rice, and foods made with them. If you want pasta, go with whole wheat pasta.   Protein power - one-quarter of your plate: Fish, chicken, beans, and nuts are all healthy, versatile protein sources. Limit red meat.   The diet, however, does go beyond the plate, offering a few other suggestions.   Use healthy plant oils, such as olive, canola, soy, corn, sunflower and peanut. Check the labels, and avoid partially hydrogenated oil, which have unhealthy trans fats.   If youre thirsty, drink water. Coffee and tea are good in moderation, but skip sugary drinks and limit milk and dairy products to one or two daily servings.   The type of carbohydrate in the diet is more  important than the amount. Some sources of carbohydrates, such as vegetables, fruits, whole grains, and beans-are healthier than others.   Finally, stay active  Signed, Thomasene Ripple, DO  08/26/2019 10:03 AM    [facility]

## 2019-08-28 NOTE — Telephone Encounter (Signed)
Prempro arrived at our office today 08/28/2019 Put at the front for the patient to pickup. Called the patient left message medication arrived and to let us know she gets this message and can pickup at the front desk.

## 2019-09-03 ENCOUNTER — Telehealth: Payer: Self-pay

## 2019-09-03 NOTE — Telephone Encounter (Signed)
Patient called in to get information on patient assistance on free medications. Please follow up with the patient to advise at [phone]

## 2019-09-04 NOTE — Telephone Encounter (Signed)
Called her back to inform we have not heard back from [employer] and novo nordisk The patient will call both to check on status of patient assistance.

## 2019-09-04 NOTE — Telephone Encounter (Signed)
Called left message to call back 

## 2019-09-21 ENCOUNTER — Telehealth: Payer: Self-pay | Admitting: Family Medicine

## 2019-09-21 NOTE — Telephone Encounter (Signed)
Caller: Analeigha Call back phone number: [phone]  Patient is calling back in regards to the patient assistance application that was sent to [employer]. Patient states the provider part of the application was not completely. This application is for Liraglutide (VICTOZA) 18 MG/3ML SOPN   Please advise.

## 2019-09-22 MED ORDER — VICTOZA 18 MG/3ML ~~LOC~~ SOPN: 1.8000 mg | PEN_INJECTOR | Freq: Every day | SUBCUTANEOUS | 4 refills | Status: DC

## 2019-09-22 NOTE — Telephone Encounter (Signed)
Called and talked to the patient and they need a prescription sent with patient assistance. Printed/PCP will sign then will send in. The patient also scheduled an appointment for to discuss diabetic medications

## 2019-09-22 NOTE — Telephone Encounter (Signed)
Called left message to call back 

## 2019-09-23 ENCOUNTER — Encounter (HOSPITAL_BASED_OUTPATIENT_CLINIC_OR_DEPARTMENT_OTHER): Payer: Self-pay | Admitting: *Deleted

## 2019-09-23 ENCOUNTER — Emergency Department (HOSPITAL_BASED_OUTPATIENT_CLINIC_OR_DEPARTMENT_OTHER)
Admission: EM | Admit: 2019-09-23 | Discharge: 2019-09-23 | Disposition: A | Discharge status: Home / Self Care | Payer: 59 | Attending: Emergency Medicine | Admitting: Emergency Medicine

## 2019-09-23 ENCOUNTER — Other Ambulatory Visit: Payer: Self-pay

## 2019-09-23 DIAGNOSIS — I252 Old myocardial infarction: Secondary | ICD-10-CM | POA: Diagnosis not present

## 2019-09-23 DIAGNOSIS — R519 Headache, unspecified: Secondary | ICD-10-CM | POA: Insufficient documentation

## 2019-09-23 DIAGNOSIS — I5020 Unspecified systolic (congestive) heart failure: Secondary | ICD-10-CM | POA: Insufficient documentation

## 2019-09-23 DIAGNOSIS — E119 Type 2 diabetes mellitus without complications: Secondary | ICD-10-CM | POA: Insufficient documentation

## 2019-09-23 DIAGNOSIS — Z794 Long term (current) use of insulin: Secondary | ICD-10-CM | POA: Diagnosis not present

## 2019-09-23 DIAGNOSIS — Z79899 Other long term (current) drug therapy: Secondary | ICD-10-CM | POA: Insufficient documentation

## 2019-09-23 DIAGNOSIS — Z87891 Personal history of nicotine dependence: Secondary | ICD-10-CM | POA: Diagnosis not present

## 2019-09-23 DIAGNOSIS — R600 Localized edema: Secondary | ICD-10-CM

## 2019-09-23 DIAGNOSIS — I110 Hypertensive heart disease with heart failure: Secondary | ICD-10-CM | POA: Insufficient documentation

## 2019-09-23 DIAGNOSIS — R220 Localized swelling, mass and lump, head: Secondary | ICD-10-CM | POA: Insufficient documentation

## 2019-09-23 LAB — PREGNANCY, URINE: Preg Test, Ur: NEGATIVE

## 2019-09-23 MED ORDER — ACETAMINOPHEN 500 MG PO TABS
500.0000 mg | ORAL_TABLET | Freq: Once | ORAL | Status: AC
  Administered 2019-09-23: 500 mg via ORAL
  Filled 2019-09-23: qty 1

## 2019-09-23 MED ORDER — IBUPROFEN 400 MG PO TABS
600.0000 mg | ORAL_TABLET | Freq: Once | ORAL | Status: AC
  Administered 2019-09-23: 600 mg via ORAL
  Filled 2019-09-23: qty 1

## 2019-09-23 NOTE — ED Triage Notes (Signed)
States began having a HA yesterday, some visual issues as well

## 2019-09-23 NOTE — Discharge Instructions (Signed)
At this time there does not appear to be the presence of an emergent medical condition, however there is always the potential for conditions to change. Please read and follow the below instructions.  Please return to the Emergency Department immediately for any new or worsening symptoms. Please be sure to follow up with your Primary Care Provider within one week regarding your visit today; please call their office to schedule an appointment even if you are feeling better for a follow-up visit. Please call the ear nose and throat specialist Dr. Annalee Genta on your discharge paperwork for further evaluation regarding your left parotid gland pain and swelling.  Call his office today to schedule an appointment.  If you have any new or worsening symptoms return to the emergency department immediately and do not wait for your ENT appointment. Please use sugar-free sour candies to help with your symptoms.  Warm compresses on the side of your face as well as anti-inflammatory such as ibuprofen may also be helpful with your symptoms.  Get help right away if: You have trouble breathing or swallowing. You have fever or chills The swelling of your face gets worse You have any redness of your skin or pus coming from your mouth You have nausea, vomiting or diarrhea You have any new/concerning or worsening of symptoms   Please read the additional information packets attached to your discharge summary.  Do not take your medicine if  develop an itchy rash, swelling in your mouth or lips, or difficulty breathing; call 911 and seek immediate emergency medical attention if this occurs.  Note: Portions of this text may have been transcribed using voice recognition software. Every effort was made to ensure accuracy; however, inadvertent computerized transcription errors may still be present.

## 2019-09-23 NOTE — ED Provider Notes (Signed)
[facility] EMERGENCY DEPARTMENT Provider Note   CSN: [id number] Arrival date & time: 09/23/19  4801     History Chief Complaint  Patient presents with  . Headache    Karen Patrick is a 54 y.o. female presents today for left facial swelling and pain.  Patient reports that this morning at 8:30 AM she was at work in eating some strawberries and blueberries along with salami when she felt pressure to the left side of her jaw.  A coworker then informed her that the left side of her jaw was swollen.  She describes a moderate intensity left jaw aching pressure constant occasionally radiating up to her head no clear aggravating or alleviating factors.  She reports that over the last hour the pain and swelling have greatly improved.  Associated with bilateral blurry vision when pain was greatest that has since improved.  Additionally patient reports that she had a frontal headache yesterday with a mild pressure sensation that lasted a few hours before she took OTC medication for relief.  Patient denies fever/chills, visual deficit, difficulty speaking, balance issues, neck stiffness, nausea/vomiting, chest pain/shortness of breath, abdominal pain, diarrhea, rash, numbness/weakness, tingling or any additional concerns. HPI     Past Medical History:  Diagnosis Date  . Acute pancreatitis 01/06/2014  . Acute systolic congestive heart failure (HCC)   . 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 Adjustment disorder with depressed mood 06/03/2016  . Ankle fracture, right   . Asthma   . Cardiomyopathy (HCC) 11/06/2017   Non-ischemic November 2018  . Depression   . Diabetes mellitus   . Drug overdose   . Essential hypertension 03/05/2017   Overview:  Added automatically from request for surgery [id number] Added automatically from request for surgery [phone]  . Fracture of right orbital floor with routine healing 10/12/2014  . 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 GERD  (gastroesophageal reflux disease)   . History of hypertension 03/05/2017  . Midline thoracic back pain 03/06/2017  . Myocardial infarction (HCC)   . NSVT (nonsustained ventricular tachycardia) (HCC) 07/03/2019  . Obstructive sleep apnea 06/26/2017   11/2017 PSG AHI: 5.4, REM AHI: 25, SpO2 nadir: 86% 04/2018 PAP PSG: CPAP 6cm  01/2019 started CPAP therapy  . Other hyperlipidemia 06/06/2018  . PAT (paroxysmal atrial tachycardia) (HCC) 07/03/2019  . Right ankle injury 11/02/2010  . Schizoaffective disorder (HCC)   . Seasonal allergies 03/30/2013   Overview:  Watery eyes and sinus pain  . Severe obesity (BMI 35.0-39.9) with comorbidity (HCC) 05/27/2019  . Suicidal ideation   . Type 2 diabetes mellitus without complications (HCC) 02/26/2013  . UTI (urinary tract infection), bacterial 01/07/2014    Patient Active Problem List   Diagnosis Date Noted  . Nonischemic cardiomyopathy (HCC) 08/26/2019  . Mixed hyperlipidemia 08/26/2019  . Obesity (BMI 30-39.9) 08/26/2019  . PAT (paroxysmal atrial tachycardia) (HCC) 07/03/2019  . 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 NSVT (nonsustained ventricular tachycardia) (HCC) 07/03/2019  . Severe obesity (BMI 35.0-39.9) with comorbidity (HCC) 05/27/2019  . Other hyperlipidemia 06/06/2018  . 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 Cardiomyopathy (HCC) 11/06/2017  . Obstructive sleep apnea 06/26/2017  . Midline thoracic back pain 03/06/2017  . Essential hypertension 03/05/2017  . History of hypertension 03/05/2017  . Adjustment disorder with depressed mood 06/03/2016  . Drug overdose   . Suicidal ideation   . Fracture of right orbital floor with routine healing 10/12/2014  . UTI (urinary tract infection), bacterial 01/07/2014  . Acute  pancreatitis 01/06/2014  . Seasonal allergies 03/30/2013  . Type 2 diabetes mellitus without complications ([place]) 28/41/3244  . Schizoaffective disorder ([place]) 06/04/2012  . Right ankle injury 11/02/2010      Past Surgical History:  Procedure Laterality Date  . BACK SURGERY    . CESAREAN SECTION    . ENDOMETRIAL ABLATION W/ NOVASURE    . FOOT FASCIOTOMY    . HAMMER TOE SURGERY    . KNEE ARTHROCENTESIS    . LIPOMA EXCISION       OB History   No obstetric history on file.     Family History  Problem Relation Age of Onset  . Stroke Mother   . Diabetes Mother   . Hypertension Mother   . Cancer Mother   . Heart failure Mother   . Stroke Father   . Sudden death Father   . Hypertension Father   . Hyperlipidemia Father   . Heart attack Father   . Diabetes Father   . Diabetes Maternal Grandmother   . Heart attack Maternal Grandmother   . Hypertension Maternal Grandmother   . Diabetes Paternal Grandmother   . Sudden death Paternal Grandmother   . Cancer Paternal Aunt     Social History   Tobacco Use  . Smoking status: Former Smoker    Types: Cigarettes    Start date: 1983    Quit date: 2006    Years since quitting: 15.4  . Smokeless tobacco: Never Used  Substance Use Topics  . Alcohol use: No  . Drug use: No    Home Medications Prior to Admission medications   Medication Sig Start Date End Date Taking? Authorizing Provider  ascorbic acid (VITAMIN C) 500 MG tablet Take 500 mg by mouth daily.   Yes [provider]  atorvastatin (LIPITOR) 20 MG tablet 20 mg daily. 06/30/18  Yes [provider]  diltiazem (CARDIZEM CD) 120 MG 24 hr capsule Take 1 capsule (120 mg total) by mouth daily. 07/03/19 10/01/19 Yes Tobb, Kardie, DO  estrogen, conjugated,-medroxyprogesterone (PREMPRO) 0.3-1.5 MG tablet Take 1 tablet by mouth daily. 12/03/14  Yes [provider]  insulin glargine (LANTUS SOLOSTAR) 100 UNIT/ML Solostar Pen Inject 20 Units into the skin daily.   Yes [provider]  Javier Docker Oil 1000 MG CAPS Take 1,000 mg by mouth daily.   Yes [provider]  liraglutide (VICTOZA) 18 MG/3ML SOPN Inject 0.3 mLs (1.8 mg total) into the skin daily.  09/22/19  Yes Shelda Pal, DO  losartan (COZAAR) 50 MG tablet 75 mg daily. 05/30/18  Yes [provider]  metFORMIN (GLUCOPHAGE-XR) 500 MG 24 hr tablet Take 1,000 mg by mouth 2 (two) times daily. 03/12/19  Yes [provider]  Multiple Vitamin (MULTIVITAMIN) capsule Take 1 capsule by mouth daily.   Yes [provider]  potassium chloride (KLOR-CON) 10 MEQ tablet 10 mEq daily. 02/25/19  Yes [provider]  spironolactone (ALDACTONE) 25 MG tablet 25 mg daily. 02/24/19  Yes [provider]  torsemide (DEMADEX) 20 MG tablet Take 1 tablet (20 mg total) by mouth daily. 05/28/19  Yes Tobb, Kardie, DO  Cranberry 400 MG TABS Take 400 mg by mouth daily.    [provider]  insulin NPH Human (NOVOLIN N) 100 UNIT/ML injection Inject 0.1 mLs (10 Units total) into the skin 2 (two) times daily before a meal. 08/05/19 09/04/19  Wendling, Crosby Oyster, DO    Allergies    Patient has no known allergies.  Review of Systems  Review of Systems Ten systems are reviewed and are negative for acute change except as noted in the HPI  Physical Exam Updated Vital Signs BP (!) 115/96 (BP Location: Right Arm)   Pulse 77   Temp 98.8 F (37.1 C) (Oral)   Resp 18   Ht 5\' 2"  (1.575 m)   Wt 88.5 kg   SpO2 100%   BMI 35.67 kg/m   Physical Exam Constitutional:      General: She is not in acute distress.    Appearance: Normal appearance. She is well-developed. She is not ill-appearing or diaphoretic.  HENT:     Head: Normocephalic and atraumatic. No raccoon eyes or Battle's sign.     Jaw: There is normal jaw occlusion. No trismus.     Salivary Glands: Right salivary gland is not diffusely enlarged or tender. Left salivary gland is diffusely enlarged and tender.      Comments: Tenderness and swelling over the left parotid gland.  No erythema, skin break, fluctuance or induration.    Right Ear: Tympanic membrane and external ear normal.     Left Ear:  Tympanic membrane and external ear normal.     Nose: Nose normal.     Right Nostril: No epistaxis.     Left Nostril: No epistaxis.     Mouth/Throat:     Mouth: Mucous membranes are moist.     Pharynx: Oropharynx is clear. Uvula midline.     Comments: The patient has normal phonation and is in control of secretions. No stridor.  Midline uvula without edema. Soft palate rises symmetrically. No tonsillar erythema, swelling or exudates. Tongue protrusion is normal, floor of mouth is soft. No trismus. No creptius on neck palpation. No gingival erythema or fluctuance noted. Mucus membranes moist. No pallor noted. Eyes:     General: Vision grossly intact. Gaze aligned appropriately.     Extraocular Movements: Extraocular movements intact.     Conjunctiva/sclera: Conjunctivae normal.     Pupils: Pupils are equal, round, and reactive to light.     Comments: Visual fields grossly intact bilaterally No pain with extraocular motion  Neck:     Trachea: Trachea and phonation normal. No tracheal tenderness or tracheal deviation.     Meningeal: Brudzinski's sign absent.  Pulmonary:     Effort: Pulmonary effort is normal. No respiratory distress.  Abdominal:     General: There is no distension.     Palpations: Abdomen is soft.     Tenderness: There is no abdominal tenderness. There is no guarding or rebound.  Musculoskeletal:        General: Normal range of motion.     Cervical back: Full passive range of motion without pain, normal range of motion and neck supple.  Skin:    General: Skin is warm and dry.  Neurological:     Mental Status: She is alert.     GCS: GCS eye subscore is 4. GCS verbal subscore is 5. GCS motor subscore is 6.     Comments: Speech is clear and goal oriented, follows commands Major Cranial nerves without deficit, no facial droop Normal strength in upper and lower extremities bilaterally including dorsiflexion and plantar flexion, strong and equal grip strength Sensation  normal to light and sharp touch Moves extremities without ataxia, coordination intact Normal finger to nose and rapid alternating movements Neg romberg, no pronator drift Normal gait Normal heel-shin and balance  Psychiatric:        Behavior: Behavior normal.    ED  Results / Procedures / Treatments   Labs (all labs ordered are listed, but only abnormal results are displayed) Labs Reviewed  PREGNANCY, URINE    EKG None  Radiology No results found.  Procedures Procedures (including critical care time)  Medications Ordered in ED Medications  acetaminophen (TYLENOL) tablet 500 mg (500 mg Oral Given 09/23/19 0947)  ibuprofen (ADVIL) tablet 600 mg (600 mg Oral Given 09/23/19 1021)    ED Course  I have reviewed the triage vital signs and the nursing notes.  Pertinent labs & imaging results that were available during my care of the patient were reviewed by me and considered in my medical decision making (see chart for details).    MDM Rules/Calculators/A&P                     Additional History Obtained: 1. Nursing notes from this visit.  Patient did not mention swelling to triage staff. 2. Prior ED visit on July 23, 2019.  Patient was diagnosed with hyperglycemia and was discharged after reassuring work-up. - 9:30 AM: Initial evaluation patient is well-appearing no acute distress.  She presents with left parotid gland enlargement and tenderness sudden onset at 8:30 AM after eating fruit at work.  She has not had this problem before.  She reported pain that radiated to her head and some blurred vision due to her severe pain.  Her symptoms appear to be resolving over the past 1 hour.  Cranial nerves intact, no meningeal signs, normal neuro exam, airway intact, no evidence of rash.  Patient denies history of allergies additionally she has no other complaints or concerns today.  Doubt allergic reaction.  Discussed case with Dr. Criss Alvine.  Suspect that symptoms today are secondary to  parotid gland swelling.  Possible that symptoms secondary to stone causing possible obstruction.  Patient symptoms have been resolving possible that patient may have passed a stone earlier.  Plan of care is to treat with NSAIDs, warm compresses and sour candies and continue to monitor in the ER as her symptoms are resolving.  Do not feel imaging is indicated at this time due to her resolving symptoms.  Additionally doubt infectious etiology given lack of fever or recent illness.  There is no overlying erythema or purulent drainage, no evidence of abscess.  Additionally doubt CNS compromise as patient has a normal neuro exam. ---- 10:30 AM:   I ordered, reviewed and interpreted labs which include: Urine pregnancy test, negative  Patient reevaluated she is resting comfortably no acute distress, patient reports continued improvement of her left jaw pain and swelling.  I discussed plan of care with patient, shared decision-making was made.  I offered patient a CT scan for assessment of possible parotid gland stone or other etiologies but she refused.  She reports that she does not want a CT scan today.  She requests to continue symptomatic treatment and to be discharged with ENT referral.  Given patient's symptoms are continuing to improve feel plan is reasonable at this time do not feel CT scan is indicated during this visit, patient will be given strict return precautions. ---------------------------- 11:15 AM: Patient reassessed, she is sleeping comfortably in bed no acute distress easily arousable to voice.  Patient reports that she is feeling much better and would like to be discharged.  Encouraged warm compresses, NSAIDs, sour sugar-free candies and ENT appointment.  Patient informed that if she has any new or worsening symptoms that she should return immediately to the emergency department and  not wait for her ENT appointment.  At this time there does not appear to be any evidence of an acute emergency  medical condition and the patient appears stable for discharge with appropriate outpatient follow up. Diagnosis was discussed with patient who verbalizes understanding of care plan and is agreeable to discharge. I have discussed return precautions with patient who verbalizes understanding. Patient encouraged to follow-up with their PCP and ENT. All questions answered.  Patient's case rediscussed with Dr. Gwenlyn Fudge who agrees with plan to discharge with follow-up.   Note: Portions of this report may have been transcribed using voice recognition software. Every effort was made to ensure accuracy; however, inadvertent computerized transcription errors may still be present. Final Clinical Impression(s) / ED Diagnoses Final diagnoses:  Swelling of left parotid gland    Rx / DC Orders ED Discharge Orders    None       Elizabeth Palau 09/23/19 1134    Pricilla Loveless, MD 09/24/19 [phone]

## 2019-09-29 ENCOUNTER — Ambulatory Visit (INDEPENDENT_AMBULATORY_CARE_PROVIDER_SITE_OTHER): Payer: 59 | Admitting: Family Medicine

## 2019-09-29 ENCOUNTER — Other Ambulatory Visit: Payer: Self-pay

## 2019-09-29 ENCOUNTER — Encounter: Payer: Self-pay | Admitting: Family Medicine

## 2019-09-29 VITALS — BP 112/68 | HR 77 | Temp 96.0°F | Ht 64.5 in | Wt 199.2 lb

## 2019-09-29 DIAGNOSIS — E1165 Type 2 diabetes mellitus with hyperglycemia: Secondary | ICD-10-CM | POA: Diagnosis not present

## 2019-09-29 DIAGNOSIS — Z794 Long term (current) use of insulin: Secondary | ICD-10-CM | POA: Diagnosis not present

## 2019-09-29 MED ORDER — DAPAGLIFLOZIN PROPANEDIOL 10 MG PO TABS: 10.0000 mg | ORAL_TABLET | Freq: Every day | ORAL | 3 refills | Status: DC

## 2019-09-29 NOTE — Progress Notes (Signed)
Chief Complaint  Patient presents with   Follow-up    medication    Subjective: Patient is a 54 y.o. female here for f/u sugars.  Diet has been healthy. Doing yoga, walking, starting to work on treadmill. Taking 30 u Novolin bid while waiting on PAP for Victoza 1.8 mg/d and Lantus 20 u qhs. Also on Metformin 1000 mg bid. Sugars running in 200's. No low's.    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   Adjustment disorder with depressed mood 06/03/2016   Ankle fracture, right    Asthma    Cardiomyopathy (HCC) 11/06/2017   Non-ischemic November 2018   Depression    Diabetes mellitus    Drug overdose    Essential hypertension 03/05/2017   Overview:  Added automatically from request for surgery [id number] Added automatically from request for surgery [phone]   Fracture of right orbital floor with routine healing 10/12/2014   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   GERD (gastroesophageal reflux disease)    History of hypertension 03/05/2017   Midline thoracic back pain 03/06/2017   Myocardial infarction [facility])    NSVT (nonsustained ventricular tachycardia) (HCC) 07/03/2019   Obstructive sleep apnea 06/26/2017   11/2017 PSG AHI: 5.4, REM AHI: 25, SpO2 nadir: 86% 04/2018 PAP PSG: CPAP 6cm  01/2019 started CPAP therapy   Other hyperlipidemia 06/06/2018   PAT (paroxysmal atrial tachycardia) (HCC) 07/03/2019   Right ankle injury 11/02/2010   Schizoaffective disorder (HCC)    Seasonal allergies 03/30/2013   Overview:  Watery eyes and sinus pain   Severe obesity (BMI 35.0-39.9) with comorbidity (HCC) 05/27/2019   Suicidal ideation    Type 2 diabetes mellitus without complications (HCC) 02/26/2013   UTI (urinary tract infection), bacterial 01/07/2014    Objective: BP 112/68 (BP Location: Left Arm, Patient Position: Sitting, Cuff Size: Normal)     Pulse 77    Temp (!) 96 F (35.6 C) (Temporal)    Ht 5' 4.5" (1.638 m)    Wt 199 lb 4 oz (90.4 kg)    SpO2 99%    BMI 33.67 kg/m  General: Awake, appears stated age Lungs: No accessory muscle use Psych: Age appropriate judgment and insight, normal affect and mood  Assessment and Plan: Type 2 diabetes mellitus with hyperglycemia, with long-term current use of insulin (HCC) - Plan: Ambulatory referral to Ophthalmology, dapagliflozin propanediol (FARXIGA) 10 MG TABS tablet  Status: Uncontrolled; add [place], payment card given. Hopefully can bridge until she gets her medicine. Counseled on diet/exercise. F/u prn or as originally scheduled.  The patient voiced understanding and agreement to the plan.  Jilda Roche Eunice, DO 09/29/19  2:41 PM

## 2019-09-29 NOTE — Patient Instructions (Signed)
Don't fill the medicine if it is too expensive.   Keep the diet clean and stay active.  Continue checking your sugars.  Keep [place] up to date after you get your Lantus and Victoza.   Let us know if you need anything.

## 2019-09-30 ENCOUNTER — Other Ambulatory Visit: Payer: Self-pay | Admitting: Family Medicine

## 2019-09-30 MED ORDER — PIOGLITAZONE HCL 30 MG PO TABS: 30.0000 mg | ORAL_TABLET | Freq: Every day | ORAL | 2 refills | Status: DC

## 2019-10-02 NOTE — Telephone Encounter (Signed)
Received Patient Assitance Victoza today 10/02/19. Received 4 boxes. Put in the refrigerator in the back of our office. Called the patient to pickup at her convenience.

## 2019-10-12 ENCOUNTER — Institutional Professional Consult (permissible substitution): Payer: 59 | Admitting: Cardiology

## 2019-10-12 NOTE — Progress Notes (Deleted)
Electrophysiology Office Note   Date:  10/12/2019   ID:  Karen Patrick, DOB 1965-11-14, MRN [id number]  PCP:  Shelda Pal, DO  Cardiologist:  Tobb Primary Electrophysiologist:  Racine Erby Meredith Leeds, MD    Chief Complaint: PVC   History of Present Illness: Karen Patrick is a 54 y.o. female who is being seen today for the evaluation of PVC at the request of Tobb, Kardie, DO. Presenting today for electrophysiology evaluation.  She has a history significant for diabetes, hypertension, hyperlipidemia.  She presents for PVCs.  She wore a cardiac monitor that showed 6% PVC burden.  The patient attempted a cardiac MRI but this could not be performed due to claustrophobia.  Today, she denies*** symptoms of palpitations, chest pain, shortness of breath, orthopnea, PND, lower extremity edema, claudication, dizziness, presyncope, syncope, bleeding, or neurologic sequela. The patient is tolerating medications without difficulties.    Past Medical History:  Diagnosis Date  . Acute pancreatitis 01/06/2014  . Acute systolic congestive heart failure ([place])   . 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 Adjustment disorder with depressed mood 06/03/2016  . Ankle fracture, right   . Asthma   . Cardiomyopathy ([place]) 11/06/2017   Non-ischemic November 2018  . Depression   . Diabetes mellitus   . Drug overdose   . Essential hypertension 03/05/2017   Overview:  Added automatically from request for surgery [id number] Added automatically from request for surgery [phone]  . Fracture of right orbital floor with routine healing 10/12/2014  . 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 GERD (gastroesophageal reflux disease)   . History of hypertension 03/05/2017  . Midline thoracic back pain 03/06/2017  . Myocardial infarction ([place])   . NSVT (nonsustained ventricular tachycardia) ([place]) 07/03/2019  . Obstructive sleep apnea 06/26/2017   11/2017 PSG AHI: 5.4, REM AHI: 25, SpO2 nadir: 86% 04/2018  PAP PSG: CPAP 6cm  01/2019 started CPAP therapy  . Other hyperlipidemia 06/06/2018  . PAT (paroxysmal atrial tachycardia) ([place]) 07/03/2019  . Right ankle injury 11/02/2010  . Schizoaffective disorder ([place])   . Seasonal allergies 03/30/2013   Overview:  Watery eyes and sinus pain  . Severe obesity (BMI 35.0-39.9) with comorbidity ([place]) 05/27/2019  . Suicidal ideation   . Type 2 diabetes mellitus without complications ([place]) 82/99/3716  . UTI (urinary tract infection), bacterial 01/07/2014   Past Surgical History:  Procedure Laterality Date  . BACK SURGERY    . CESAREAN SECTION    . ENDOMETRIAL ABLATION W/ NOVASURE    . FOOT FASCIOTOMY    . HAMMER TOE SURGERY    . KNEE ARTHROCENTESIS    . LIPOMA EXCISION       Current Outpatient Medications  Medication Sig Dispense Refill  . ascorbic acid (VITAMIN C) 500 MG tablet Take 500 mg by mouth daily.    Marland Kitchen atorvastatin (LIPITOR) 20 MG tablet 20 mg daily.    . Cranberry 400 MG TABS Take 400 mg by mouth daily.    Marland Kitchen diltiazem (CARDIZEM CD) 120 MG 24 hr capsule Take 1 capsule (120 mg total) by mouth daily. 90 capsule 1  . estrogen, conjugated,-medroxyprogesterone (PREMPRO) 0.3-1.5 MG tablet Take 1 tablet by mouth daily.    . insulin glargine (LANTUS SOLOSTAR) 100 UNIT/ML Solostar Pen Inject 20 Units into the skin daily.    . insulin NPH Human (NOVOLIN N) 100 UNIT/ML injection Inject 0.1 mLs (10 Units total) into the skin 2 (two) times daily before a meal. 6 mL 0  . Krill Oil  1000 MG CAPS Take 1,000 mg by mouth daily.    Marland Kitchen liraglutide (VICTOZA) 18 MG/3ML SOPN Inject 0.3 mLs (1.8 mg total) into the skin daily. 12 pen 4  . losartan (COZAAR) 50 MG tablet 75 mg daily.    . metFORMIN (GLUCOPHAGE-XR) 500 MG 24 hr tablet Take 1,000 mg by mouth 2 (two) times daily.    . Multiple Vitamin (MULTIVITAMIN) capsule Take 1 capsule by mouth daily.    . pioglitazone (ACTOS) 30 MG tablet Take 1 tablet (30 mg total) by mouth daily. 30 tablet 2  . potassium chloride  (KLOR-CON) 10 MEQ tablet 10 mEq daily.    Marland Kitchen spironolactone (ALDACTONE) 25 MG tablet 25 mg daily.    Marland Kitchen torsemide (DEMADEX) 20 MG tablet Take 1 tablet (20 mg total) by mouth daily. 90 tablet 1   No current facility-administered medications for this visit.    Allergies:   Patient has no known allergies.   Social History:  The patient  reports that she quit smoking about 15 years ago. Her smoking use included cigarettes. She started smoking about 38 years ago. She has never used smokeless tobacco. She reports that she does not drink alcohol and does not use drugs.   Family History:  The patient's family history includes Cancer in her mother and paternal aunt; Diabetes in her father, maternal grandmother, mother, and paternal grandmother; Heart attack in her father and maternal grandmother; Heart failure in her mother; Hyperlipidemia in her father; Hypertension in her father, maternal grandmother, and mother; Stroke in her father and mother; Sudden death in her father and paternal grandmother.    ROS:  Please see the history of present illness.   Otherwise, review of systems is positive for none.   All other systems are reviewed and negative.    PHYSICAL EXAM: VS:  There were no vitals taken for this visit. , BMI There is no height or weight on file to calculate BMI. GEN: Well nourished, well developed, in no acute distress  HEENT: normal  Neck: no JVD, carotid bruits, or masses Cardiac: ***RRR; no murmurs, rubs, or gallops,no edema  Respiratory:  clear to auscultation bilaterally, normal work of breathing GI: soft, nontender, nondistended, + BS MS: no deformity or atrophy  Skin: warm and dry Neuro:  Strength and sensation are intact Psych: euthymic mood, full affect  EKG:  EKG {ACTION; IS/IS POE:42353614} ordered today. Personal review of the ekg ordered *** shows ***  Recent Labs: 01/25/2019: B Natriuretic Peptide 10.8 05/28/2019: Magnesium 1.6; TSH 0.982 07/23/2019: Hemoglobin 13.4;  Platelets 218 08/05/2019: ALT 21; BUN 18; Creatinine, Ser 0.65; Potassium 3.7; Sodium 137    Lipid Panel  No results found for: CHOL, TRIG, HDL, CHOLHDL, VLDL, LDLCALC, LDLDIRECT   Wt Readings from Last 3 Encounters:  09/29/19 199 lb 4 oz (90.4 kg)  09/23/19 195 lb (88.5 kg)  08/26/19 194 lb (88 kg)      Other studies Reviewed: Additional studies/ records that were reviewed today include: Cardiac monitor 06/27/2019 personally reviewed Review of the above records today demonstrates: The minimum heart rate was 67 bpm, maximum heart rate was 184  bpm, and average heart rate was 88 bpm. Predominant underlying rhythm was Sinus Rhythm.  Premature atrial complexes were rare (<1%). Premature Ventricular complexes were frequent (6.8%, [id number]).   12 patient triggered events noted with 9 associated with ventricular ectopy with the remaining associated with sinus rhythm. 8 diary event noted 7 associated with ventricular ectopy and there remaining associated with sinus rhythm.  ASSESSMENT AND PLAN:  1.  PVCs: Currently on diltiazem.  Had initially tried beta-blockers, but she felt poorly with this.***  2.  Hypertension:***  3.  Hyperlipidemia: Continue atorvastatin per primary cardiology    Current medicines are reviewed at length with the patient today.   The patient {ACTIONS; HAS/DOES NOT HAVE:19233} concerns regarding her medicines.  The following changes were made today:  {NONE DEFAULTED:18576::"none"}  Labs/ tests ordered today include: *** No orders of the defined types were placed in this encounter.    Disposition:   FU with Eudell Mcphee {gen number [id number]} {Days to years:10300}  Signed, Marieliz Strang Jorja Loa, MD  10/12/2019 1:48 PM     [facility] [address] [place] [address] [phone] (office) [phone] (fax)

## 2019-10-26 ENCOUNTER — Encounter: Payer: Self-pay | Admitting: Cardiology

## 2019-10-26 ENCOUNTER — Ambulatory Visit (INDEPENDENT_AMBULATORY_CARE_PROVIDER_SITE_OTHER): Payer: 59 | Admitting: Cardiology

## 2019-10-26 ENCOUNTER — Other Ambulatory Visit: Payer: Self-pay

## 2019-10-26 VITALS — BP 100/62 | HR 84 | Ht 64.5 in | Wt 197.0 lb

## 2019-10-26 DIAGNOSIS — I493 Ventricular premature depolarization: Secondary | ICD-10-CM

## 2019-10-26 NOTE — Progress Notes (Signed)
Electrophysiology Office Note   Date:  10/26/2019   ID:  Karen Patrick, DOB [birth year], MRN [id number]  PCP:  Sharlene Dory, DO  Cardiologist:  Tobb Primary Electrophysiologist:  Kamren Heintzelman Jorja Loa, MD    Chief Complaint: PVC   History of Present Illness: Karen Patrick is a 54 y.o. female who is being seen today for the evaluation of PVC at the request of Tobb, Kardie, DO. Presenting today for electrophysiology evaluation.  She has a history of diabetes, hypertension, hyperlipidemia, nonischemic cardiomyopathy with an EF of 35 to 40% in 2018 with a repeat echo showing an ejection fraction of 50%.  She initially presented to cardiology clinic 05/28/2019 with palpitations.  She wore a ZIO monitor which showed frequent PVCs and nonsustained VT.  She was started on Cardizem at that time.  She was ordered for cardiac MRI was not done due to significant claustrophobia.  She feels improved with medication change to diltiazem.  Per the patient, she thought that she was taking carvedilol, but when she checked her medications from the mail order, this was not in the list.  Once on the diltiazem, she felt much worse with more palpitations and fatigue.  On her own, she stopped the diltiazem and started taking carvedilol which greatly improved her symptoms.  She is currently unaware of palpitations.  Today, she denies symptoms of palpitations, chest pain, shortness of breath, orthopnea, PND, lower extremity edema, claudication, dizziness, presyncope, syncope, bleeding, or neurologic sequela. The patient is tolerating medications without difficulties.    Past Medical History:  Diagnosis Date  . Acute pancreatitis 01/06/2014  . Acute systolic congestive heart failure ([facility])   . 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 Adjustment disorder with depressed mood 06/03/2016  . Ankle fracture, right   . Asthma   . Cardiomyopathy ([facility]) 11/06/2017   Non-ischemic  November 2018  . Depression   . Diabetes mellitus   . Drug overdose   . Essential hypertension 03/05/2017   Overview:  Added automatically from request for surgery [id number] Added automatically from request for surgery [phone]  . Fracture of right orbital floor with routine healing 10/12/2014  . 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 GERD (gastroesophageal reflux disease)   . History of hypertension 03/05/2017  . Midline thoracic back pain 03/06/2017  . Myocardial infarction ([facility])   . NSVT (nonsustained ventricular tachycardia) ([facility]) 07/03/2019  . Obstructive sleep apnea 06/26/2017   11/2017 PSG AHI: 5.4, REM AHI: 25, SpO2 nadir: 86% 04/2018 PAP PSG: CPAP 6cm  01/2019 started CPAP therapy  . Other hyperlipidemia 06/06/2018  . PAT (paroxysmal atrial tachycardia) ([facility]) 07/03/2019  . Right ankle injury 11/02/2010  . Schizoaffective disorder ([facility])   . Seasonal allergies 03/30/2013   Overview:  Watery eyes and sinus pain  . Severe obesity (BMI 35.0-39.9) with comorbidity ([facility]) 05/27/2019  . Suicidal ideation   . Type 2 diabetes mellitus without complications ([facility]) 02/26/2013  . UTI (urinary tract infection), bacterial 01/07/2014   Past Surgical History:  Procedure Laterality Date  . BACK SURGERY    . CESAREAN SECTION    . ENDOMETRIAL ABLATION W/ NOVASURE    . FOOT FASCIOTOMY    . HAMMER TOE SURGERY    . KNEE ARTHROCENTESIS    . LIPOMA EXCISION       Current Outpatient Medications  Medication Sig Dispense Refill  . ascorbic acid (VITAMIN C) 500 MG tablet Take 500 mg by mouth daily.    Marland Kitchen atorvastatin (LIPITOR) 20 MG tablet 20  mg daily.    . carvedilol (COREG) 12.5 MG tablet Take 12.5 mg by mouth 2 (two) times daily with a meal.    . Cranberry 400 MG TABS Take 400 mg by mouth daily.    Marland Kitchen estrogen, conjugated,-medroxyprogesterone (PREMPRO) 0.3-1.5 MG tablet Take 1 tablet by mouth daily.    . insulin glargine (LANTUS SOLOSTAR) 100 UNIT/ML Solostar Pen Inject 20 Units into the skin daily.    Boris Lown Oil 1000 MG CAPS  Take 1,000 mg by mouth daily.    Marland Kitchen liraglutide (VICTOZA) 18 MG/3ML SOPN Inject 0.3 mLs (1.8 mg total) into the skin daily. 12 pen 4  . losartan (COZAAR) 50 MG tablet 75 mg daily.    . metFORMIN (GLUCOPHAGE-XR) 500 MG 24 hr tablet Take 1,000 mg by mouth 2 (two) times daily.    . Multiple Vitamin (MULTIVITAMIN) capsule Take 1 capsule by mouth daily.    . pioglitazone (ACTOS) 30 MG tablet Take 1 tablet (30 mg total) by mouth daily. 30 tablet 2  . potassium chloride (KLOR-CON) 10 MEQ tablet 10 mEq daily.    Marland Kitchen spironolactone (ALDACTONE) 25 MG tablet 25 mg daily.    Marland Kitchen torsemide (DEMADEX) 20 MG tablet Take 1 tablet (20 mg total) by mouth daily. 90 tablet 1  . insulin NPH Human (NOVOLIN N) 100 UNIT/ML injection Inject 0.1 mLs (10 Units total) into the skin 2 (two) times daily before a meal. 6 mL 0   No current facility-administered medications for this visit.    Allergies:   Patient has no known allergies.   Social History:  The patient  reports that she quit smoking about 15 years ago. Her smoking use included cigarettes. She started smoking about 38 years ago. She has never used smokeless tobacco. She reports that she does not drink alcohol and does not use drugs.   Family History:  The patient's family history includes Cancer in her mother and paternal aunt; Diabetes in her father, maternal grandmother, mother, and paternal grandmother; Heart attack in her father and maternal grandmother; Heart failure in her mother; Hyperlipidemia in her father; Hypertension in her father, maternal grandmother, and mother; Stroke in her father and mother; Sudden death in her father and paternal grandmother.    ROS:  Please see the history of present illness.   Otherwise, review of systems is positive for none.   All other systems are reviewed and negative.    PHYSICAL EXAM: VS:  BP 100/62   Pulse 84   Ht 5' 4.5" (1.638 m)   Wt 197 lb (89.4 kg)   SpO2 97%   BMI 33.29 kg/m  , BMI Body mass index is 33.29  kg/m. GEN: Well nourished, well developed, in no acute distress  HEENT: normal  Neck: no JVD, carotid bruits, or masses Cardiac: RRR; no murmurs, rubs, or gallops,no edema  Respiratory:  clear to auscultation bilaterally, normal work of breathing GI: soft, nontender, nondistended, + BS MS: no deformity or atrophy  Skin: warm and dry Neuro:  Strength and sensation are intact Psych: euthymic mood, full affect  EKG:  EKG is ordered today. Personal review of the ekg ordered shows sinus rhythm, rate  Recent Labs: 01/25/2019: B Natriuretic Peptide 10.8 05/28/2019: Magnesium 1.6; TSH 0.982 07/23/2019: Hemoglobin 13.4; Platelets 218 08/05/2019: ALT 21; BUN 18; Creatinine, Ser 0.65; Potassium 3.7; Sodium 137    Lipid Panel  No results found for: CHOL, TRIG, HDL, CHOLHDL, VLDL, LDLCALC, LDLDIRECT   Wt Readings from Last 3 Encounters:  10/26/19  197 lb (89.4 kg)  09/29/19 199 lb 4 oz (90.4 kg)  09/23/19 195 lb (88.5 kg)      Other studies Reviewed: Additional studies/ records that were reviewed today include: Cardiac monitor 06/27/2019 personally reviewed Review of the above records today demonstrates:  1. 2 runs of nonsustained ventricular tachycardia. 2. Rare asymptomatic paroxysmal atrial tachycardia with variable block 3. Premature Ventricular complexes were frequent (6.8%, [id number]). Ventricular Bigeminy and Trigeminy were present.   Transthoracic echocardiogram in May 05 2019 report left ventricular ejection fraction greater than 50%. Low normal left ventricular systolic function. Normal left ventricular wall thickness. Normal left ventricular cavity size. Trace mitral regurgitation. Trace tricuspid regurgitation. Unable to calculate the RV systolic pressure. Mild pulmonic regurgitation. Left atrial size was normal. Grade 1 diastolic dysfunction.  ASSESSMENT AND PLAN:  1.  PVCs: 6.8% on cardiac monitor.  She is currently on carvedilol.  She had a medication mixup and was  put on diltiazem which made her feel worse.  When she started carvedilol again, she felt much improved.  Her PVC burden is low and thus we Stachia Slutsky hold off on further therapy as she is feeling well.  I do not feel that this Brittnei Jagiello cause her issues with her overall cardiac function.  2.  Hypertension: Currently well controlled  3.  Obesity: Diet and exercise encouraged  Case discussed with primary cardiology  Current medicines are reviewed at length with the patient today.   The patient does not have concerns regarding her medicines.  The following changes were made today:  none  Labs/ tests ordered today include:  Orders Placed This Encounter  Procedures  . EKG 12-Lead     Disposition:   FU with Ronte Parker as needed  Signed, Yarrow Linhart Jorja Loa, MD  10/26/2019 2:47 PM     [facility] [address] [place] [address] [phone] (office) [phone] (fax)

## 2019-10-26 NOTE — Patient Instructions (Signed)
Medication Instructions:  Your physician recommends that you continue on your current medications as directed. Please refer to the Current Medication list given to you today.  *If you need a refill on your cardiac medications before your next appointment, please call your pharmacy*   Lab Work: None ordered   Testing/Procedures: None ordered   Follow-Up: At [facility], you and your health needs are our priority.  As part of our continuing mission to provide you with exceptional heart care, we have created designated Provider Care Teams.  These Care Teams include your primary Cardiologist (physician) and Advanced Practice Providers (APPs -  Physician Assistants and Nurse Practitioners) who all work together to provide you with the care you need, when you need it.  We recommend signing up for the patient portal called "MyChart".  Sign up information is provided on this After Visit Summary.  MyChart is used to connect with patients for Virtual Visits (Telemedicine).  Patients are able to view lab/test results, encounter notes, upcoming appointments, etc.  Non-urgent messages can be sent to your provider as well.   To learn more about what you can do with MyChart, go to [email or web address].    Your next appointment:    as needed  The format for your next appointment:   In Person  Provider:   Loman Brooklyn, MD   Thank you for choosing [facility]!!   Dory Horn, RN [phone]    Other Instructions

## 2019-10-28 ENCOUNTER — Telehealth: Payer: Self-pay | Admitting: Family Medicine

## 2019-10-28 NOTE — Telephone Encounter (Signed)
Received letter of approval from [employer] that patient will received Lantus through [employer] Patient connection Program. The patient will remain eligible for this therapy until 10/26/2020 Patient informed of approval.

## 2019-10-29 NOTE — Telephone Encounter (Signed)
Patient called to confirm med came in. I let her know it was in and she should get call from Robin on tomorrow to let her know what time she can pick up

## 2019-10-30 NOTE — Telephone Encounter (Signed)
Called the patient to inform patient assistance insulin has arrived at our office and is in the frig in the back nurses station in a brown paper bag/door of frig with her name on it. She will pickup some time today 10/30/2019 Friday.

## 2019-11-18 ENCOUNTER — Other Ambulatory Visit: Payer: Self-pay | Admitting: Family Medicine

## 2019-11-18 MED ORDER — METFORMIN HCL ER 500 MG PO TB24: 1000.0000 mg | ORAL_TABLET | Freq: Two times a day (BID) | ORAL | 1 refills | Status: DC

## 2019-11-18 MED ORDER — SPIRONOLACTONE 25 MG PO TABS: 25.0000 mg | ORAL_TABLET | Freq: Every day | ORAL | 1 refills | Status: DC

## 2019-11-18 MED ORDER — LOSARTAN POTASSIUM 50 MG PO TABS: 75.0000 mg | ORAL_TABLET | Freq: Every day | ORAL | 1 refills | Status: DC

## 2019-11-18 MED ORDER — TORSEMIDE 20 MG PO TABS: 20.0000 mg | ORAL_TABLET | Freq: Every day | ORAL | 1 refills | Status: DC

## 2019-11-18 MED ORDER — POTASSIUM CHLORIDE CRYS ER 10 MEQ PO TBCR: 10.0000 meq | EXTENDED_RELEASE_TABLET | Freq: Every day | ORAL | 1 refills | Status: DC

## 2019-11-18 MED ORDER — ATORVASTATIN CALCIUM 20 MG PO TABS: 20.0000 mg | ORAL_TABLET | Freq: Every day | ORAL | 1 refills | Status: DC

## 2019-11-23 ENCOUNTER — Telehealth: Payer: Self-pay | Admitting: Cardiology

## 2019-11-23 MED ORDER — CARVEDILOL 12.5 MG PO TABS: 12.5000 mg | ORAL_TABLET | Freq: Two times a day (BID) | ORAL | 3 refills | Status: DC

## 2019-11-23 NOTE — Telephone Encounter (Signed)
*  STAT* If patient is at the pharmacy, call can be transferred to refill team.   1. Which medications need to be refilled? (please list name of each medication and dose if known)  carvedilol (COREG) 12.5 MG tablet   2. Which pharmacy/location (including street and city if local pharmacy) is medication to be sent to? [facility] [address] [place], [place] - [address]   3. Do they need a 30 day or 90 day supply? 90

## 2019-11-23 NOTE — Telephone Encounter (Signed)
Refill sent in per request.  

## 2019-12-01 ENCOUNTER — Telehealth: Payer: Self-pay | Admitting: Family Medicine

## 2019-12-01 NOTE — Telephone Encounter (Signed)
New message:  Pt states this medication normally comes to the office  1.Medication Requested: estrogen, conjugated,-medroxyprogesterone (PREMPRO) 0.3-1.5 MG tablet 2. Pharmacy (Name, Street, [place]):  3. On Med List:   4. Last Visit with PCP:   5. Next visit date with PCP:   Agent: Please be advised that RX refills may take up to 3 business days. We ask that you follow-up with your pharmacy.

## 2019-12-01 NOTE — Telephone Encounter (Signed)
Patient informed we have not received this yet.

## 2019-12-02 ENCOUNTER — Encounter: Payer: Self-pay | Admitting: Family Medicine

## 2019-12-08 ENCOUNTER — Ambulatory Visit: Payer: 59 | Admitting: Cardiology

## 2019-12-16 ENCOUNTER — Other Ambulatory Visit: Payer: Self-pay | Admitting: Family Medicine

## 2019-12-16 NOTE — Telephone Encounter (Signed)
Called informed the patient we have not received her Prempro yet Gave her [employer] number to call [phone] and her CW#[id number] She is due another order to be shipped to our office. Last one arrived 07/2019. She has been approved for patient assistance through 07/2020.

## 2019-12-16 NOTE — Telephone Encounter (Signed)
Patient is calling to check status of medication   Please advise

## 2019-12-18 ENCOUNTER — Ambulatory Visit: Payer: 59 | Admitting: Cardiology

## 2019-12-28 ENCOUNTER — Telehealth: Payer: Self-pay | Admitting: Family Medicine

## 2019-12-28 NOTE — Telephone Encounter (Signed)
Received today 12/28/2019 Prempro #3 boxes #28 tablets each box. Put at the front for the patient to pickup. Called to inform arrived and ready for pickup.

## 2019-12-28 NOTE — Telephone Encounter (Signed)
Received patient assistance program refill request for Lantus. Called the patient to answer refill questions. Faxed to [employer] at [phone]. Received fax confirmation. Sent to scan /// kept copy in folder.

## 2020-01-01 ENCOUNTER — Telehealth: Payer: Self-pay | Admitting: Family Medicine

## 2020-01-01 NOTE — Telephone Encounter (Signed)
Faxed over to [employer] refill for [facility] confirmation

## 2020-01-06 NOTE — Telephone Encounter (Signed)
Patient assistance Lantus came in today 01/06/20 1 Box--5/3 ml pens. Put in frig in nursing station. Called the patient to inform to pickup at her convenience

## 2020-01-11 ENCOUNTER — Telehealth: Payer: Self-pay | Admitting: Family Medicine

## 2020-01-11 NOTE — Telephone Encounter (Signed)
Patient states spouse will come to pick up medication(lantus) from office today.

## 2020-01-25 ENCOUNTER — Other Ambulatory Visit: Payer: Self-pay | Admitting: Family Medicine

## 2020-01-25 ENCOUNTER — Telehealth: Payer: Self-pay | Admitting: Family Medicine

## 2020-01-25 MED ORDER — METFORMIN HCL ER 500 MG PO TB24
1000.0000 mg | ORAL_TABLET | Freq: Two times a day (BID) | ORAL | 1 refills | Status: DC
Start: 2020-01-25 — End: 2020-03-21

## 2020-01-25 NOTE — Telephone Encounter (Signed)
Patient states she has received a form  From  patient assistance. Patient states she only has one pen left and will need assistance getting her medication. Patient states she will upload form to her mychart for provider to complete for the assistance.  liraglutide (VICTOZA) 18 MG/3ML SOPN [[id number]]

## 2020-01-25 NOTE — Addendum Note (Signed)
Addended by: Scharlene Gloss B on: 01/25/2020 03:54 PM   Modules accepted: Orders

## 2020-01-25 NOTE — Telephone Encounter (Signed)
Will look for it in mychart/print and complete the form

## 2020-01-25 NOTE — Telephone Encounter (Signed)
Refill done.  

## 2020-01-25 NOTE — Telephone Encounter (Signed)
Medication: metFORMIN (GLUCOPHAGE-XR) 500 MG 24 hr tablet [[id number]]    Has the patient contacted their pharmacy? No. (If no, request that the patient contact the pharmacy for the refill.) (If yes, when and what did the pharmacy advise?)  Preferred Pharmacy (with phone number or street name):  [facility] [address] [place], [place] - [address] Phone:  [phone]  Fax:  [phone]       Agent: Please be advised that RX refills may take up to 3 business days. We ask that you follow-up with your pharmacy.

## 2020-01-29 ENCOUNTER — Telehealth: Payer: Self-pay | Admitting: Family Medicine

## 2020-01-29 MED ORDER — VICTOZA 18 MG/3ML ~~LOC~~ SOPN: 1.8000 mg | PEN_INJECTOR | Freq: Every day | SUBCUTANEOUS | 1 refills | Status: DC

## 2020-01-29 NOTE — Addendum Note (Signed)
Addended by: Scharlene Gloss B on: 01/29/2020 12:55 PM   Modules accepted: Orders

## 2020-01-29 NOTE — Telephone Encounter (Signed)
Medication: liraglutide (VICTOZA) 18 MG/3ML SOPN  Has the patient contacted their pharmacy? No. (If no, request that the patient contact the pharmacy for the refill.) (If yes, when and what did the pharmacy advise?)  Preferred Pharmacy (with phone number or street name): [facility] [address] [place], [place] - [address]  [address], [place] [address]  Phone:  [phone] Fax:  [phone]  DEA #:  --  Agent: Please be advised that RX refills may take up to 3 business days. We ask that you follow-up with your pharmacy.

## 2020-01-29 NOTE — Telephone Encounter (Signed)
Refill done.  

## 2020-02-02 ENCOUNTER — Encounter: Payer: Self-pay | Admitting: Cardiology

## 2020-02-02 ENCOUNTER — Other Ambulatory Visit: Payer: Self-pay

## 2020-02-02 ENCOUNTER — Ambulatory Visit (INDEPENDENT_AMBULATORY_CARE_PROVIDER_SITE_OTHER): Payer: PRIVATE HEALTH INSURANCE | Admitting: Cardiology

## 2020-02-02 VITALS — BP 110/80 | HR 72 | Ht 62.0 in | Wt 195.8 lb

## 2020-02-02 DIAGNOSIS — E782 Mixed hyperlipidemia: Secondary | ICD-10-CM

## 2020-02-02 DIAGNOSIS — R0602 Shortness of breath: Secondary | ICD-10-CM | POA: Diagnosis not present

## 2020-02-02 DIAGNOSIS — R079 Chest pain, unspecified: Secondary | ICD-10-CM

## 2020-02-02 DIAGNOSIS — E669 Obesity, unspecified: Secondary | ICD-10-CM

## 2020-02-02 DIAGNOSIS — I4729 Other ventricular tachycardia: Secondary | ICD-10-CM

## 2020-02-02 DIAGNOSIS — Z794 Long term (current) use of insulin: Secondary | ICD-10-CM

## 2020-02-02 DIAGNOSIS — G4733 Obstructive sleep apnea (adult) (pediatric): Secondary | ICD-10-CM

## 2020-02-02 DIAGNOSIS — E1100 Type 2 diabetes mellitus with hyperosmolarity without nonketotic hyperglycemic-hyperosmolar coma (NKHHC): Secondary | ICD-10-CM

## 2020-02-02 DIAGNOSIS — I10 Essential (primary) hypertension: Secondary | ICD-10-CM

## 2020-02-02 DIAGNOSIS — I472 Ventricular tachycardia: Secondary | ICD-10-CM

## 2020-02-02 DIAGNOSIS — I471 Supraventricular tachycardia: Secondary | ICD-10-CM

## 2020-02-02 DIAGNOSIS — I493 Ventricular premature depolarization: Secondary | ICD-10-CM | POA: Diagnosis not present

## 2020-02-02 MED ORDER — NITROGLYCERIN 0.4 MG SL SUBL: 0.4000 mg | SUBLINGUAL_TABLET | SUBLINGUAL | 3 refills | Status: DC | PRN

## 2020-02-02 MED ORDER — METOPROLOL TARTRATE 100 MG PO TABS: 100.0000 mg | ORAL_TABLET | Freq: Once | ORAL | 0 refills | Status: DC

## 2020-02-02 NOTE — Progress Notes (Signed)
Cardiology Office Note:    Date:  02/02/2020   ID:  Karen Patrick, DOB Feb 26, 1966, MRN [id number]  PCP:  Shelda Pal, DO  Cardiologist:  Berniece Salines, DO  Electrophysiologist:  None   Referring MD: Shelda Pal*   " I am experiencing chest pain shortness of breath"  History of Present Illness:    Karen Patrick is a 54 y.o. female with a hx of hx of diabetes mellitus, hypertension, hyperlipidemia, family history of premature coronary disease (father first MI in his 75s and mother died of MI 54),history of nonischemic cardiomyopathy with a EF of 35 to 40%2018and a left heart catheterization which was also normal. Most recent echocardiogram at the outside facility showed EF greater than 50%.  I saw the patient April 2021 at that time we had discussed her monitor results which showed frequent PVCs as well as 2 runs of nonsustained ventricular tachycardia.  Started on Cardizem and recommend she undergo cardiac MRI.  Unfortunately patient was unable to perform the study due to claustrophobia.  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  She did see EP.  However in the interim prior to see EP she had transition from Cardizem to Coreg due to the fact that she was convincing worsening palpitations.  Her palpitations had resolved therefore medical management was recommended to be continued.  She is here today for follow-up visit.  The patient recently got married in August.  I am happy for her.  She tells me she experiencing worsening shortness of breath on exertion as well as intermittent chest pain.  She described the pain as a squeezing sensation.  She notes that she is to be able to walk close to a mile and be fine but recently she cannot walk half a mile without experiencing significant chest pain.  She has to wait 5 to 10 minutes for this to resolve.  She is  concerned about this.  In addition the shortness of breath keeps getting worse.   Past Medical History:  Diagnosis Date  . Acute pancreatitis 01/06/2014  . Acute systolic congestive heart failure ([place])   . 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 Adjustment disorder with depressed mood 06/03/2016  . Ankle fracture, right   . Asthma   . Cardiomyopathy ([place]) 11/06/2017   Non-ischemic November 2018  . Depression   . Diabetes mellitus   . Drug overdose   . Essential hypertension 03/05/2017   Overview:  Added automatically from request for surgery [id number] Added automatically from request for surgery [phone]  . Fracture of right orbital floor with routine healing 10/12/2014  . 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 GERD (gastroesophageal reflux disease)   . History of hypertension 03/05/2017  . Midline thoracic back pain 03/06/2017  . Myocardial infarction ([place])   . NSVT (nonsustained ventricular tachycardia) ([place]) 07/03/2019  . Obstructive sleep apnea 06/26/2017   11/2017 PSG AHI: 5.4, REM AHI: 25, SpO2 nadir: 86% 04/2018 PAP PSG: CPAP 6cm  01/2019 started CPAP therapy  . Other hyperlipidemia 06/06/2018  . PAT (paroxysmal atrial tachycardia) ([place]) 07/03/2019  . Right ankle injury 11/02/2010  . Schizoaffective disorder ([place])   . Seasonal allergies 03/30/2013   Overview:  Watery eyes and sinus pain  . Severe obesity (BMI 35.0-39.9) with comorbidity ([place]) 05/27/2019  . Suicidal ideation   . Type 2 diabetes mellitus without complications ([place]) 27/61/4709  . UTI (  urinary tract infection), bacterial 01/07/2014    Past Surgical History:  Procedure Laterality Date  . BACK SURGERY    . CESAREAN SECTION    . ENDOMETRIAL ABLATION W/ NOVASURE    . FOOT FASCIOTOMY    . HAMMER TOE SURGERY    . KNEE ARTHROCENTESIS    . LIPOMA EXCISION      Current Medications: Current Meds  Medication Sig  . ascorbic acid (VITAMIN C) 500 MG tablet Take 500 mg by mouth daily.  Marland Kitchen atorvastatin  (LIPITOR) 20 MG tablet Take 1 tablet (20 mg total) by mouth daily.  . carvedilol (COREG) 12.5 MG tablet Take 1 tablet (12.5 mg total) by mouth 2 (two) times daily with a meal.  . Cranberry 400 MG TABS Take 400 mg by mouth daily.  Marland Kitchen estrogen, conjugated,-medroxyprogesterone (PREMPRO) 0.3-1.5 MG tablet Take 1 tablet by mouth daily.  . insulin glargine (LANTUS SOLOSTAR) 100 UNIT/ML Solostar Pen Inject 20 Units into the skin daily.  Javier Docker Oil 1000 MG CAPS Take 1,000 mg by mouth daily.  Marland Kitchen liraglutide (VICTOZA) 18 MG/3ML SOPN Inject 1.8 mg into the skin daily.  Marland Kitchen losartan (COZAAR) 50 MG tablet Take 1.5 tablets (75 mg total) by mouth daily.  . metFORMIN (GLUCOPHAGE-XR) 500 MG 24 hr tablet Take 2 tablets (1,000 mg total) by mouth 2 (two) times daily.  . Multiple Vitamin (MULTIVITAMIN) capsule Take 1 capsule by mouth daily.  . potassium chloride (KLOR-CON) 10 MEQ tablet Take 1 tablet (10 mEq total) by mouth daily.  Marland Kitchen spironolactone (ALDACTONE) 25 MG tablet Take 1 tablet (25 mg total) by mouth daily.  Marland Kitchen torsemide (DEMADEX) 20 MG tablet TAKE 1 TABLET BY MOUTH DAILY     Allergies:   Patient has no known allergies.   Social History   Socioeconomic History  . Marital status: Divorced    Spouse name: Not on file  . Number of children: Not on file  . Years of education: Not on file  . Highest education level: Not on file  Occupational History  . Not on file  Tobacco Use  . Smoking status: Former Smoker    Types: Cigarettes    Start date: 1983    Quit date: 2006    Years since quitting: 15.7  . Smokeless tobacco: Never Used  Substance and Sexual Activity  . Alcohol use: No  . Drug use: No  . Sexual activity: Yes    Birth control/protection: Surgical  Other Topics Concern  . Not on file  Social History Narrative  . Not on file   Social Determinants of Health   Financial Resource Strain:   . Difficulty of Paying Living Expenses: Not on file  Food Insecurity:   . Worried About Paediatric nurse in the Last Year: Not on file  . Ran Out of Food in the Last Year: Not on file  Transportation Needs:   . Lack of Transportation (Medical): Not on file  . Lack of Transportation (Non-Medical): Not on file  Physical Activity:   . Days of Exercise per Week: Not on file  . Minutes of Exercise per Session: Not on file  Stress:   . Feeling of Stress : Not on file  Social Connections:   . Frequency of Communication with Friends and Family: Not on file  . Frequency of Social Gatherings with Friends and Family: Not on file  . Attends Religious Services: Not on file  . Active Member of Clubs or Organizations: Not on file  .  Attends Archivist Meetings: Not on file  . Marital Status: Not on file     Family History: The patient's family history includes Cancer in her mother and paternal aunt; Diabetes in her father, maternal grandmother, mother, and paternal grandmother; Heart attack in her father and maternal grandmother; Heart failure in her mother; Hyperlipidemia in her father; Hypertension in her father, maternal grandmother, and mother; Stroke in her father and mother; Sudden death in her father and paternal grandmother.  ROS:   Review of Systems  Constitution: Negative for decreased appetite, fever and weight gain.  HENT: Negative for congestion, ear discharge, hoarse voice and sore throat.   Eyes: Negative for discharge, redness, vision loss in right eye and visual halos.  Cardiovascular: Negative for chest pain, dyspnea on exertion, leg swelling, orthopnea and palpitations.  Respiratory: Negative for cough, hemoptysis, shortness of breath and snoring.   Endocrine: Negative for heat intolerance and polyphagia.  Hematologic/Lymphatic: Negative for bleeding problem. Does not bruise/bleed easily.  Skin: Negative for flushing, nail changes, rash and suspicious lesions.  Musculoskeletal: Negative for arthritis, joint pain, muscle cramps, myalgias, neck pain and stiffness.   Gastrointestinal: Negative for abdominal pain, bowel incontinence, diarrhea and excessive appetite.  Genitourinary: Negative for decreased libido, genital sores and incomplete emptying.  Neurological: Negative for brief paralysis, focal weakness, headaches and loss of balance.  Psychiatric/Behavioral: Negative for altered mental status, depression and suicidal ideas.  Allergic/Immunologic: Negative for HIV exposure and persistent infections.    EKGs/Labs/Other Studies Reviewed:    The following studies were reviewed today:   EKG: None today   The minimum heart rate was 67 bpm, maximum heart rate was 184bpm, and average heart rate was88bpm. Predominant underlying rhythm was Sinus Rhythm.  2 Ventricular Tachycardia runs occurred, the run with the fastest interval lasting 8 beats with a maximum heart rate of 184 bpm, the longest lasting 5 beats with an average rate of 108 bpm.   1 run of Supraventricular Tachycardia occurred lasting 6 beats with a maximum heart rate of 124 bpm (average 109 bpm).  Premature atrial complexes were rare (<1%). Premature Ventricular complexeswere frequent (6.8%, [id number]). Ventricular Bigeminy and Trigeminy were present.  12 patient triggered eventsnoted with 9 associated with ventricular ectopy with the remaining associated with sinus rhythm. 8 diary event noted 7 associated with ventricular ectopy and there remaining associated with sinus rhythm.   No pauses, No AV block and no atrial fibrillation present.  Conclusion: This study isremarkable for the following:  2 runs of nonsustained ventricular tachycardia.  Rare asymptomatic paroxysmal atrial tachycardia with variable block.  Frequent Symptomatic premature ventricular complexes.  Transthoracic echocardiogram in May 05 2019 report left ventricular ejection fraction greater than 50%. Low normal left ventricular  systolic function. Normal left ventricular wall thickness. Normal left ventricular cavity size. Trace mitral regurgitation. Trace tricuspid regurgitation. Unable to calculate the RV systolic pressure. Mild pulmonic regurgitation. Left atrial size was normal. Grade 1 diastolic dysfunction.  Recent Labs: 05/28/2019: Magnesium 1.6; TSH 0.982 07/23/2019: Hemoglobin 13.4; Platelets 218 08/05/2019: ALT 21; BUN 18; Creatinine, Ser 0.65; Potassium 3.7; Sodium 137  Recent Lipid Panel No results found for: CHOL, TRIG, HDL, CHOLHDL, VLDL, LDLCALC, LDLDIRECT  Physical Exam:    VS:  BP 110/80 (BP Location: Left Arm, Patient Position: Sitting, Cuff Size: Normal)   Pulse 72   Ht '5\' 2"'  (1.575 m)   Wt 195 lb 12.8 oz (88.8 kg)   SpO2 97%   BMI 35.81 kg/m     Wt  Readings from Last 3 Encounters:  02/02/20 195 lb 12.8 oz (88.8 kg)  10/26/19 197 lb (89.4 kg)  09/29/19 199 lb 4 oz (90.4 kg)     GEN: Well nourished, well developed in no acute distress HEENT: Normal NECK: No JVD; No carotid bruits LYMPHATICS: No lymphadenopathy CARDIAC: S1S2 noted,RRR, no murmurs, rubs, gallops RESPIRATORY:  Clear to auscultation without rales, wheezing or rhonchi  ABDOMEN: Soft, non-tender, non-distended, +bowel sounds, no guarding. EXTREMITIES: No edema, No cyanosis, no clubbing MUSCULOSKELETAL:  No deformity  SKIN: Warm and dry NEUROLOGIC:  Alert and oriented x 3, non-focal PSYCHIATRIC:  Normal affect, good insight  ASSESSMENT:    1. Chest pain of uncertain etiology   2. Shortness of breath   3. NSVT (nonsustained ventricular tachycardia) ([place])   4. Frequent PVCs   5. PAT (paroxysmal atrial tachycardia) ([place])   6. Essential hypertension   7. Obstructive sleep apnea   8. Obesity (BMI 30-39.9)   9. Mixed hyperlipidemia   10. Type 2 diabetes mellitus with hyperosmolarity without coma, with long-term current use of insulin ([facility])    PLAN:     1.  I am concerned about her chest pain given the patient  has significant risk factors for coronary artery disease ( diabetes, pressure, history, hyperlipidemia and hypertension).  We will discuss pursuing ischemic evaluation a coronary CTA has been recommended.  She is agreeable to undergo this testing.  She has no IV contrast dye allergy.  Sublingual nitroglycerin prescription was sent, its protocol and 911 protocol explained and the patient vocalized understanding questions were answered to the patient's satisfaction  2.  In addition for worsening shortness of breath I am concerned as the patient does have history of dilated cardiomyopathy.  Although her echo in January 2021 at an outside facility reports a EF of 50% is appropriate this time to get another echocardiogram to reassess her LV function.  3.  Palpitations has improved we will continue carvedilol.  4.  Continue atorvastatin 20 mg a day.  5.  The patient understands the need to lose weight with diet and exercise. We have discussed specific strategies for this.  The patient is in agreement with the above plan. The patient left the office in stable condition.  The patient will follow up in 3 months or sooner if needed.   Medication Adjustments/Labs and Tests Ordered: Current medicines are reviewed at length with the patient today.  Concerns regarding medicines are outlined above.  Orders Placed This Encounter  Procedures  . CT CORONARY MORPH W/CTA COR W/SCORE W/CA W/CM &/OR WO/CM  . CT CORONARY FRACTIONAL FLOW RESERVE DATA PREP  . CT CORONARY FRACTIONAL FLOW RESERVE FLUID ANALYSIS  . Basic metabolic panel  . ECHOCARDIOGRAM COMPLETE   Meds ordered this encounter  Medications  . nitroGLYCERIN (NITROSTAT) 0.4 MG SL tablet    Sig: Place 1 tablet (0.4 mg total) under the tongue every 5 (five) minutes as needed for chest pain.    Dispense:  90 tablet    Refill:  3  . metoprolol tartrate (LOPRESSOR) 100 MG tablet    Sig: Take 1 tablet (100 mg total) by mouth once for 1 dose. Take two hours  prior to your cardiac CT    Dispense:  1 tablet    Refill:  0    Patient Instructions  Medication Instructions:  Your physician has recommended you make the following change in your medication:  START: Nutroglycerin 0.4 mg take one tablet by mouth every 5 minutes as needed  for chest pain.  *If you need a refill on your cardiac medications before your next appointment, please call your pharmacy*   Lab Work: Your physician recommends that you return for lab work in: Within one week before your cardiac CT BMP If you have labs (blood work) drawn today and your tests are completely normal, you will receive your results only by: Marland Kitchen MyChart Message (if you have MyChart) OR . A paper copy in the mail If you have any lab test that is abnormal or we need to change your treatment, we will call you to review the results.   Testing/Procedures: Your physician has requested that you have an echocardiogram. Echocardiography is a painless test that uses sound waves to create images of your heart. It provides your doctor with information about the size and shape of your heart and how well your heart's chambers and valves are working. This procedure takes approximately one hour. There are no restrictions for this procedure.  Your cardiac CT will be scheduled at the below location:   [facility] [address] [place], [place] [address] [phone]   If scheduled at [facility], please arrive at the [facility] main entrance of [facility] 30 minutes prior to test start time. Proceed to the [facility] Radiology Department (first floor) to check-in and test prep.   Please follow these instructions carefully (unless otherwise directed):   On the Night Before the Test: . Be sure to Drink plenty of water. . Do not consume any caffeinated/decaffeinated beverages or chocolate 12 hours prior to your test. . Do not take any antihistamines 12 hours prior to your test.  On  the Day of the Test: . Drink plenty of water. Do not drink any water within one hour of the test. . Do not eat any food 4 hours prior to the test. . You may take your regular medications prior to the test.  . Take metoprolol (Lopressor) two hours prior to test. . FEMALES- please wear underwire-free bra if available      After the Test: . Drink plenty of water. . After receiving IV contrast, you may experience a mild flushed feeling. This is normal. . On occasion, you may experience a mild rash up to 24 hours after the test. This is not dangerous. If this occurs, you can take Benadryl 25 mg and increase your fluid intake. . If you experience trouble breathing, this can be serious. If it is severe call 911 IMMEDIATELY. If it is mild, please call our office. . If you take any of these medications: Glipizide/Metformin, Avandament, Glucavance, please do not take 48 hours after completing test unless otherwise instructed.   Once we have confirmed authorization from your insurance company, we will call you to set up a date and time for your test. Based on how quickly your insurance processes prior authorizations requests, please allow up to 4 weeks to be contacted for scheduling your Cardiac CT appointment. Be advised that routine Cardiac CT appointments could be scheduled as many as 8 weeks after your provider has ordered it.  For non-scheduling related questions, please contact the cardiac imaging nurse navigator should you have any questions/concerns: Marchia Bond, Cardiac Imaging Nurse Navigator Burley Saver, Interim Cardiac Imaging Nurse [facility] Direct Office Dial: [phone]   For scheduling needs, including cancellations and rescheduling, please call Vivien Rota at [phone], option 3.      Follow-Up: At [facility], you and your  health needs are our priority.  As part of our continuing mission to provide you with exceptional heart care, we have  created designated Provider Care Teams.  These Care Teams include your primary Cardiologist (physician) and Advanced Practice Providers (APPs -  Physician Assistants and Nurse Practitioners) who all work together to provide you with the care you need, when you need it.  We recommend signing up for the patient portal called "MyChart".  Sign up information is provided on this After Visit Summary.  MyChart is used to connect with patients for Virtual Visits (Telemedicine).  Patients are able to view lab/test results, encounter notes, upcoming appointments, etc.  Non-urgent messages can be sent to your provider as well.   To learn more about what you can do with MyChart, go to [email or web address].    Your next appointment:   3 month(s)  The format for your next appointment:   In Person  Provider:   Berniece Salines, DO   Other Instructions      Adopting a Healthy Lifestyle.  Know what a healthy weight is for you (roughly BMI <25) and aim to maintain this   Aim for 7+ servings of fruits and vegetables daily   65-80+ fluid ounces of water or unsweet tea for healthy kidneys   Limit to max 1 drink of alcohol per day; avoid smoking/tobacco   Limit animal fats in diet for cholesterol and heart health - choose grass fed whenever available   Avoid highly processed foods, and foods high in saturated/trans fats   Aim for low stress - take time to unwind and care for your mental health   Aim for 150 min of moderate intensity exercise weekly for heart health, and weights twice weekly for bone health   Aim for 7-9 hours of sleep daily   When it comes to diets, agreement about the perfect plan isnt easy to find, even among the experts. Experts at the [place] developed an idea known as the Healthy Eating Plate. Just imagine a plate divided into logical, healthy portions.   The emphasis is on diet quality:   Load up on vegetables and fruits - one-half of your plate:  Aim for color and variety, and remember that potatoes dont count.   Go for whole grains - one-quarter of your plate: Whole wheat, barley, wheat berries, quinoa, oats, brown rice, and foods made with them. If you want pasta, go with whole wheat pasta.   Protein power - one-quarter of your plate: Fish, chicken, beans, and nuts are all healthy, versatile protein sources. Limit red meat.   The diet, however, does go beyond the plate, offering a few other suggestions.   Use healthy plant oils, such as olive, canola, soy, corn, sunflower and peanut. Check the labels, and avoid partially hydrogenated oil, which have unhealthy trans fats.   If youre thirsty, drink water. Coffee and tea are good in moderation, but skip sugary drinks and limit milk and dairy products to one or two daily servings.   The type of carbohydrate in the diet is more important than the amount. Some sources of carbohydrates, such as vegetables, fruits, whole grains, and beans-are healthier than others.   Finally, stay active  Signed, Berniece Salines, DO  02/02/2020 8:48 AM    [facility]

## 2020-02-02 NOTE — Patient Instructions (Addendum)
Medication Instructions:  Your physician has recommended you make the following change in your medication:  START: Nutroglycerin 0.4 mg take one tablet by mouth every 5 minutes as needed for chest pain.  *If you need a refill on your cardiac medications before your next appointment, please call your pharmacy*   Lab Work: Your physician recommends that you return for lab work in: Within one week before your cardiac CT BMP If you have labs (blood work) drawn today and your tests are completely normal, you will receive your results only by: Marland Kitchen MyChart Message (if you have MyChart) OR . A paper copy in the mail If you have any lab test that is abnormal or we need to change your treatment, we will call you to review the results.   Testing/Procedures: Your physician has requested that you have an echocardiogram. Echocardiography is a painless test that uses sound waves to create images of your heart. It provides your doctor with information about the size and shape of your heart and how well your heart's chambers and valves are working. This procedure takes approximately one hour. There are no restrictions for this procedure.  Your cardiac CT will be scheduled at the below location:   [facility] [address] [place], [place] [address] [phone]   If scheduled at [facility], please arrive at the [facility] main entrance of [facility] 30 minutes prior to test start time. Proceed to the [facility] Radiology Department (first floor) to check-in and test prep.   Please follow these instructions carefully (unless otherwise directed):   On the Night Before the Test: . Be sure to Drink plenty of water. . Do not consume any caffeinated/decaffeinated beverages or chocolate 12 hours prior to your test. . Do not take any antihistamines 12 hours prior to your test.  On the Day of the Test: . Drink plenty of water. Do not drink any water within one hour of the  test. . Do not eat any food 4 hours prior to the test. . You may take your regular medications prior to the test.  . Take metoprolol (Lopressor) two hours prior to test. . FEMALES- please wear underwire-free bra if available      After the Test: . Drink plenty of water. . After receiving IV contrast, you may experience a mild flushed feeling. This is normal. . On occasion, you may experience a mild rash up to 24 hours after the test. This is not dangerous. If this occurs, you can take Benadryl 25 mg and increase your fluid intake. . If you experience trouble breathing, this can be serious. If it is severe call 911 IMMEDIATELY. If it is mild, please call our office. . If you take any of these medications: Glipizide/Metformin, Avandament, Glucavance, please do not take 48 hours after completing test unless otherwise instructed.   Once we have confirmed authorization from your insurance company, we will call you to set up a date and time for your test. Based on how quickly your insurance processes prior authorizations requests, please allow up to 4 weeks to be contacted for scheduling your Cardiac CT appointment. Be advised that routine Cardiac CT appointments could be scheduled as many as 8 weeks after your provider has ordered it.  For non-scheduling related questions, please contact the cardiac imaging nurse navigator should you have any questions/concerns: Marchia Bond, Cardiac Imaging Nurse Navigator Burley Saver, Interim Cardiac Imaging Nurse Navigator [place] [facility] Direct Office  Dial: [phone]   For scheduling needs, including cancellations and rescheduling, please call Vivien Rota at [phone], option 3.      Follow-Up: At [facility], you and your health needs are our priority.  As part of our continuing mission to provide you with exceptional heart care, we have created designated Provider Care Teams.  These Care Teams include your primary Cardiologist  (physician) and Advanced Practice Providers (APPs -  Physician Assistants and Nurse Practitioners) who all work together to provide you with the care you need, when you need it.  We recommend signing up for the patient portal called "MyChart".  Sign up information is provided on this After Visit Summary.  MyChart is used to connect with patients for Virtual Visits (Telemedicine).  Patients are able to view lab/test results, encounter notes, upcoming appointments, etc.  Non-urgent messages can be sent to your provider as well.   To learn more about what you can do with MyChart, go to [email or web address].    Your next appointment:   3 month(s)  The format for your next appointment:   In Person  Provider:   Berniece Salines, DO   Other Instructions

## 2020-02-05 ENCOUNTER — Encounter: Payer: Self-pay | Admitting: Family Medicine

## 2020-02-05 ENCOUNTER — Other Ambulatory Visit: Payer: Self-pay

## 2020-02-05 ENCOUNTER — Ambulatory Visit (INDEPENDENT_AMBULATORY_CARE_PROVIDER_SITE_OTHER): Payer: PRIVATE HEALTH INSURANCE | Admitting: Family Medicine

## 2020-02-05 VITALS — BP 128/80 | HR 84 | Temp 98.5°F | Ht 62.5 in | Wt 196.5 lb

## 2020-02-05 DIAGNOSIS — Z794 Long term (current) use of insulin: Secondary | ICD-10-CM | POA: Diagnosis not present

## 2020-02-05 DIAGNOSIS — I10 Essential (primary) hypertension: Secondary | ICD-10-CM | POA: Diagnosis not present

## 2020-02-05 DIAGNOSIS — E1165 Type 2 diabetes mellitus with hyperglycemia: Secondary | ICD-10-CM

## 2020-02-05 DIAGNOSIS — Z23 Encounter for immunization: Secondary | ICD-10-CM | POA: Diagnosis not present

## 2020-02-05 NOTE — Progress Notes (Signed)
Subjective:   Chief Complaint  Patient presents with  . Follow-up    medication and lab work.Karen Patrick month left for insurance    Karen Patrick is a 54 y.o. female here for follow-up of diabetes.   Karen Patrick's self monitored glucose range is 120-170's when she takes her medication. Patient confirms hypoglycemic reactions. She checks her glucose levels 1 times per day. Patient does require insulin- Lantus 20 u qhs.   Medications include: Victoza 1.8 mg/d, Metformin XR 1000 mg bid Diet is better.  Exercise: hiking, yoga  Hypertension Patient presents for hypertension follow up. She does monitor home blood pressures. Blood pressures ranging on average from 110-120's/70-80's. She is usually compliant with medications- losartan 75 mg/d, spironolactone 25 mg/d, Coreg 12.5 mg bid. Patient has these side effects of medication: none Diet/exercise as above.   Past Medical History:  Diagnosis Date  . Acute pancreatitis 01/06/2014  . Acute systolic congestive heart failure (HCC)   . 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 Adjustment disorder with depressed mood 06/03/2016  . Ankle fracture, right   . Asthma   . Cardiomyopathy (HCC) 11/06/2017   Non-ischemic November 2018  . Depression   . Diabetes mellitus   . Drug overdose   . Essential hypertension 03/05/2017   Overview:  Added automatically from request for surgery [id number] Added automatically from request for surgery [phone]  . Fracture of right orbital floor with routine healing 10/12/2014  . 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 GERD (gastroesophageal reflux disease)   . History of hypertension 03/05/2017  . Midline thoracic back pain 03/06/2017  . Myocardial infarction (HCC)   . NSVT (nonsustained ventricular tachycardia) (HCC) 07/03/2019  . Obstructive sleep apnea 06/26/2017   11/2017 PSG AHI: 5.4, REM AHI: 25, SpO2 nadir: 86% 04/2018 PAP PSG: CPAP 6cm  01/2019 started CPAP therapy  . Other hyperlipidemia 06/06/2018  .  PAT (paroxysmal atrial tachycardia) (HCC) 07/03/2019  . Right ankle injury 11/02/2010  . Schizoaffective disorder (HCC)   . Seasonal allergies 03/30/2013   Overview:  Watery eyes and sinus pain  . Severe obesity (BMI 35.0-39.9) with comorbidity (HCC) 05/27/2019  . Suicidal ideation   . Type 2 diabetes mellitus without complications (HCC) 02/26/2013  . UTI (urinary tract infection), bacterial 01/07/2014     Related testing: Retinal exam: Done Pneumovax: done  Objective:  BP 128/80 (BP Location: Left Arm, Patient Position: Sitting, Cuff Size: Normal)   Pulse 84   Temp 98.5 F (36.9 C) (Oral)   Ht 5' 2.5" (1.588 m)   Wt 196 lb 8 oz (89.1 kg)   SpO2 96%   BMI 35.37 kg/m  General:  Well developed, well nourished, in no apparent distress Skin:  Warm, no pallor or diaphoresis Head:  Normocephalic, atraumatic Eyes:  Pupils equal and round, sclera anicteric without injection  Lungs:  CTAB, no access msc use Cardio:  RRR, no bruits, no LE edema Musculoskeletal:  Symmetrical muscle groups noted without atrophy or deformity Neuro:  Sensation intact to pinprick on feet Psych: Age appropriate judgment and insight  Assessment:   Type 2 diabetes mellitus with hyperglycemia, with long-term current use of insulin (HCC) - Plan: Lipid panel, Hemoglobin A1c, Comprehensive metabolic panel  Essential hypertension  Need for influenza vaccination - Plan: Flu Vaccine QUAD 6+ mos PF IM (Fluarix Quad PF)   Plan:   1.  Continue Metformin XR 1000 mg twice daily, Victoza 1.8 mg daily.  Will consider adding Farxiga 10 mg daily if not controlled.  Counseled on diet and exercise. 2.  Continue Coreg 12.5 mg twice daily, losartan 75 mg daily, and spironolactone 25 mg daily. GYN info given in paperwork.  F/u in 3-6 mo pending above. The patient voiced understanding and agreement to the plan.  Jilda Roche [place], DO 02/05/20 12:03 PM

## 2020-02-05 NOTE — Patient Instructions (Addendum)
Give [place] 2-3 business days to get the results of your labs back.   Our follow up will be based on your results.   Keep the diet clean and stay active.  Consider adding weight resistance exercise.  Call Center for [facility] at [phone] for an appointment.  They are located at [address], [place], [place], [address] (right across the hall from our office).  Let us know if you need anything.

## 2020-02-06 ENCOUNTER — Other Ambulatory Visit: Payer: Self-pay | Admitting: Family Medicine

## 2020-02-06 LAB — COMPREHENSIVE METABOLIC PANEL
AG Ratio: 1.5 (calc) (ref 1.0–2.5)
ALT: 14 U/L (ref 6–29)
AST: 12 U/L (ref 10–35)
Albumin: 4.1 g/dL (ref 3.6–5.1)
Alkaline phosphatase (APISO): 84 U/L (ref 37–153)
BUN: 12 mg/dL (ref 7–25)
CO2: 25 mmol/L (ref 20–32)
Calcium: 9.1 mg/dL (ref 8.6–10.4)
Chloride: 105 mmol/L (ref 98–110)
Creat: 0.54 mg/dL (ref 0.50–1.05)
Globulin: 2.7 g/dL (calc) (ref 1.9–3.7)
Glucose, Bld: 163 mg/dL — ABNORMAL HIGH (ref 65–99)
Potassium: 4.1 mmol/L (ref 3.5–5.3)
Sodium: 138 mmol/L (ref 135–146)
Total Bilirubin: 0.5 mg/dL (ref 0.2–1.2)
Total Protein: 6.8 g/dL (ref 6.1–8.1)

## 2020-02-06 LAB — LIPID PANEL
Cholesterol: 123 mg/dL (ref <200)
HDL: 57 mg/dL (ref >=50)
LDL Cholesterol (Calc): 52 mg/dL (calc)
Non-HDL Cholesterol (Calc): 66 mg/dL (calc) (ref <130)
Total CHOL/HDL Ratio: 2.2 (calc) (ref <5.0)
Triglycerides: 61 mg/dL (ref <150)

## 2020-02-06 LAB — HEMOGLOBIN A1C
Hgb A1c MFr Bld: 7.9 % of total Hgb — ABNORMAL HIGH (ref <5.7)
Mean Plasma Glucose: 180 (calc)
eAG (mmol/L): 10 (calc)

## 2020-02-06 MED ORDER — DAPAGLIFLOZIN PROPANEDIOL 10 MG PO TABS: 10.0000 mg | ORAL_TABLET | Freq: Every day | ORAL | 3 refills | Status: DC

## 2020-02-10 ENCOUNTER — Ambulatory Visit (HOSPITAL_BASED_OUTPATIENT_CLINIC_OR_DEPARTMENT_OTHER)
Admission: RE | Admit: 2020-02-10 | Discharge: 2020-02-10 | Disposition: A | Discharge status: Home / Self Care | Payer: PRIVATE HEALTH INSURANCE | Source: Ambulatory Visit | Attending: Cardiology | Admitting: Cardiology

## 2020-02-10 ENCOUNTER — Other Ambulatory Visit: Payer: Self-pay

## 2020-02-10 DIAGNOSIS — R0602 Shortness of breath: Secondary | ICD-10-CM | POA: Diagnosis present

## 2020-02-10 DIAGNOSIS — R079 Chest pain, unspecified: Secondary | ICD-10-CM | POA: Insufficient documentation

## 2020-02-10 LAB — ECHOCARDIOGRAM COMPLETE
Area-P 1/2: 2.54 cm2
S' Lateral: 3.9 cm

## 2020-02-11 ENCOUNTER — Telehealth: Payer: Self-pay

## 2020-02-11 NOTE — Telephone Encounter (Signed)
-----   Message from Thomasene Ripple, DO sent at 02/10/2020  6:04 PM EDT ----- Your echo showed that your EF is slightly lower is 40 to 45%.  Please put the patient on my schedule for November 1 when I am in [place] as I like to optimize her medical regimen.

## 2020-02-17 ENCOUNTER — Other Ambulatory Visit: Payer: Self-pay

## 2020-02-17 DIAGNOSIS — I428 Other cardiomyopathies: Secondary | ICD-10-CM

## 2020-02-22 ENCOUNTER — Telehealth (HOSPITAL_COMMUNITY): Payer: Self-pay | Admitting: Emergency Medicine

## 2020-02-22 NOTE — Telephone Encounter (Signed)
Attempted to call patient regarding upcoming cardiac CT appointment. °Left message on voicemail with name and callback number °Ellanie Oppedisano RN Navigator Cardiac Imaging °[facility] °[phone] Office °[phone] Cell ° °

## 2020-02-23 ENCOUNTER — Encounter (HOSPITAL_COMMUNITY): Payer: Self-pay

## 2020-02-23 ENCOUNTER — Other Ambulatory Visit: Payer: Self-pay

## 2020-02-23 ENCOUNTER — Telehealth (HOSPITAL_COMMUNITY): Payer: Self-pay | Admitting: Emergency Medicine

## 2020-02-23 ENCOUNTER — Other Ambulatory Visit (HOSPITAL_COMMUNITY): Payer: Self-pay | Admitting: Emergency Medicine

## 2020-02-23 ENCOUNTER — Ambulatory Visit (HOSPITAL_COMMUNITY)
Admission: RE | Admit: 2020-02-23 | Discharge: 2020-02-23 | Disposition: A | Discharge status: Home / Self Care | Payer: PRIVATE HEALTH INSURANCE | Source: Ambulatory Visit | Attending: Cardiology | Admitting: Cardiology

## 2020-02-23 DIAGNOSIS — I428 Other cardiomyopathies: Secondary | ICD-10-CM

## 2020-02-23 MED ORDER — METOPROLOL TARTRATE 100 MG PO TABS: 100.0000 mg | ORAL_TABLET | Freq: Once | ORAL | 0 refills | Status: DC

## 2020-02-23 NOTE — Progress Notes (Signed)
CT heart exam scheduled for today cancelled due to heart rate in upper 80s, BP 103/73. Patient was unaware that metoprolol had been prescribed prior to scan. Dr. Servando Salina informed and decision made to cancel scan and have patient take metoprolol as prescribed. CT heart navigator Rockwell Alexandria made aware and CT heart scan rescheduled for tomorrow 03/26/20 @ 4 pm. Information and instructions regarding cardiac CT appointment given to patient by Rockwell Alexandria, RN. Patient states understanding without any further questions.

## 2020-02-23 NOTE — Telephone Encounter (Signed)
Pt here for CCTA and was not aware to take PO metoprolol prior to appt.   I confirmed her pharmacy and I will send refill for one time dose 100mg  metoprolol. Told her to hold diuretics.  Will r/s her tomorrow at 4pm.  RN Navigator Cardiac Imaging [facility] [phone] Office  [phone] Cell

## 2020-02-24 ENCOUNTER — Ambulatory Visit (HOSPITAL_COMMUNITY)
Admission: RE | Admit: 2020-02-24 | Discharge: 2020-02-24 | Disposition: A | Discharge status: Home / Self Care | Payer: PRIVATE HEALTH INSURANCE | Source: Ambulatory Visit | Attending: Cardiology | Admitting: Cardiology

## 2020-02-24 DIAGNOSIS — I428 Other cardiomyopathies: Secondary | ICD-10-CM | POA: Insufficient documentation

## 2020-02-24 MED ORDER — NITROGLYCERIN 0.4 MG SL SUBL: 0.8000 mg | SUBLINGUAL_TABLET | Freq: Once | SUBLINGUAL | Status: AC

## 2020-02-24 MED ORDER — METOPROLOL TARTRATE 5 MG/5ML IV SOLN
INTRAVENOUS | Status: AC
  Administered 2020-02-24: 5 mg via INTRAVENOUS
  Filled 2020-02-24: qty 20

## 2020-02-24 MED ORDER — METOPROLOL TARTRATE 5 MG/5ML IV SOLN
5.0000 mg | INTRAVENOUS | Status: DC | PRN
  Administered 2020-02-24: 5 mg via INTRAVENOUS

## 2020-02-24 MED ORDER — IOHEXOL 350 MG/ML SOLN
100.0000 mL | Freq: Once | INTRAVENOUS | Status: AC | PRN
  Administered 2020-02-24: 100 mL via INTRAVENOUS

## 2020-02-24 MED ORDER — DILTIAZEM HCL 25 MG/5ML IV SOLN
INTRAVENOUS | Status: AC
  Filled 2020-02-24: qty 5

## 2020-02-24 MED ORDER — NITROGLYCERIN 0.4 MG SL SUBL
SUBLINGUAL_TABLET | SUBLINGUAL | Status: AC
  Administered 2020-02-24: 0.8 mg via SUBLINGUAL
  Filled 2020-02-24: qty 2

## 2020-02-24 MED ORDER — DILTIAZEM HCL 25 MG/5ML IV SOLN
5.0000 mg | Freq: Once | INTRAVENOUS | Status: AC
  Administered 2020-02-24: 5 mg via INTRAVENOUS
  Filled 2020-02-24: qty 5

## 2020-02-24 NOTE — Progress Notes (Signed)
Called and spoke to Dr. Servando Salina. Let her know that the patient's heart rate is not decreasing with using medications. MD stated to have the patient scanned retrospectively. Notified CT of the change.

## 2020-02-25 ENCOUNTER — Ambulatory Visit (HOSPITAL_COMMUNITY)
Admission: RE | Admit: 2020-02-25 | Discharge: 2020-02-25 | Disposition: A | Discharge status: Home / Self Care | Payer: No Typology Code available for payment source | Source: Ambulatory Visit | Attending: Cardiology | Admitting: Cardiology

## 2020-02-25 ENCOUNTER — Other Ambulatory Visit: Payer: Self-pay

## 2020-02-25 DIAGNOSIS — R0602 Shortness of breath: Secondary | ICD-10-CM | POA: Insufficient documentation

## 2020-02-25 DIAGNOSIS — R079 Chest pain, unspecified: Secondary | ICD-10-CM | POA: Diagnosis present

## 2020-02-26 DIAGNOSIS — I428 Other cardiomyopathies: Secondary | ICD-10-CM | POA: Diagnosis not present

## 2020-02-29 ENCOUNTER — Ambulatory Visit (INDEPENDENT_AMBULATORY_CARE_PROVIDER_SITE_OTHER): Payer: PRIVATE HEALTH INSURANCE | Admitting: Cardiology

## 2020-02-29 ENCOUNTER — Encounter: Payer: Self-pay | Admitting: Cardiology

## 2020-02-29 ENCOUNTER — Telehealth: Payer: Self-pay | Admitting: Family Medicine

## 2020-02-29 ENCOUNTER — Other Ambulatory Visit: Payer: Self-pay

## 2020-02-29 VITALS — BP 110/70 | HR 44 | Ht 62.5 in | Wt 189.0 lb

## 2020-02-29 DIAGNOSIS — I472 Ventricular tachycardia: Secondary | ICD-10-CM

## 2020-02-29 DIAGNOSIS — I493 Ventricular premature depolarization: Secondary | ICD-10-CM

## 2020-02-29 DIAGNOSIS — I4729 Other ventricular tachycardia: Secondary | ICD-10-CM

## 2020-02-29 DIAGNOSIS — R0989 Other specified symptoms and signs involving the circulatory and respiratory systems: Secondary | ICD-10-CM

## 2020-02-29 DIAGNOSIS — I10 Essential (primary) hypertension: Secondary | ICD-10-CM

## 2020-02-29 DIAGNOSIS — G4733 Obstructive sleep apnea (adult) (pediatric): Secondary | ICD-10-CM

## 2020-02-29 DIAGNOSIS — I428 Other cardiomyopathies: Secondary | ICD-10-CM

## 2020-02-29 DIAGNOSIS — E119 Type 2 diabetes mellitus without complications: Secondary | ICD-10-CM

## 2020-02-29 MED ORDER — ENTRESTO 24-26 MG PO TABS: 1.0000 | ORAL_TABLET | Freq: Two times a day (BID) | ORAL | 3 refills | Status: DC

## 2020-02-29 NOTE — Telephone Encounter (Signed)
Called the number for [employer] Requested a refill Refill confirmed [id number] Arrival to our office 7 to 10 business days. Called the patient to inform refill done//arrival date

## 2020-02-29 NOTE — Telephone Encounter (Signed)
Patient states a refill need to be called in to [employer] Patient assistance at [phone], pt ID [id number]  estrogen, conjugated,-medroxyprogesterone (PREMPRO) 0.3-1.5 MG tablet [[id number]]

## 2020-02-29 NOTE — Patient Instructions (Signed)
Medication Instructions:  Your physician has recommended you make the following change in your medication:   STOP: Potassium STOP: Losartan  START: Entresto 24/26 twice daily   *If you need a refill on your cardiac medications before your next appointment, please call your pharmacy*   Lab Work: Your physician recommends that you return for lab work today: bmp, mg   If you have labs (blood work) drawn today and your tests are completely normal, you will receive your results only by: Marland Kitchen MyChart Message (if you have MyChart) OR . A paper copy in the mail If you have any lab test that is abnormal or we need to change your treatment, we will call you to review the results.   Testing/Procedures: None  [phone]  Follow-Up: At [facility], you and your health needs are our priority.  As part of our continuing mission to provide you with exceptional heart care, we have created designated Provider Care Teams.  These Care Teams include your primary Cardiologist (physician) and Advanced Practice Providers (APPs -  Physician Assistants and Nurse Practitioners) who all work together to provide you with the care you need, when you need it.  We recommend signing up for the patient portal called "MyChart".  Sign up information is provided on this After Visit Summary.  MyChart is used to connect with patients for Virtual Visits (Telemedicine).  Patients are able to view lab/test results, encounter notes, upcoming appointments, etc.  Non-urgent messages can be sent to your provider as well.   To learn more about what you can do with MyChart, go to [email or web address].    Your next appointment:   4 month(s)  The format for your next appointment:   In Person  Provider:   Thomasene Ripple, DO   Other Instructions  Sacubitril; Valsartan Oral Tablets What is this medicine? SACUBITRIL; VALSARTAN (sak UE bi tril; val SAR tan) is a combination of a neprilysin inhibitor and a an angiotensin II  receptor blocker. It treats heart failure. This medicine may be used for other purposes; ask your health care provider or pharmacist if you have questions. COMMON BRAND NAME(S): Entresto What should I tell my health care provider before I take this medicine? They need to know if you have any of these conditions:  diabetes and take a medicine that contains aliskiren  kidney disease  liver disease  an unusual or allergic reaction to sacubitril; valsartan, drugs called angiotensin converting enzyme (ACE) inhibitors, angiotensin II receptor blockers (ARBs), other medicines, foods, dyes, or preservatives  pregnant or trying to get pregnant  breast-feeding How should I use this medicine? Take this drug by mouth. Take it as directed on the prescription label at the same time every day. You can take it with or without food. If it upsets your stomach, take it with food. Keep taking it unless your health care provider tells you to stop. Talk to your health care provider about the use of this drug in children. While it may be prescribed for children as young as 1 for selected conditions, precautions do apply. Overdosage: If you think you have taken too much of this medicine contact a poison control center or emergency room at once. NOTE: This medicine is only for you. Do not share this medicine with others. What if I miss a dose? If you miss a dose, take it as soon as you can. If it is almost time for your next dose, take only that dose. Do not take double or extra doses.  What may interact with this medicine? Do not take this medicine with any of the following medicines:  aliskiren if you have diabetes  angiotensin-converting enzyme (ACE) inhibitors, like benazepril, captopril, enalapril, fosinopril, lisinopril, or ramipril This medicine may also interact with the following medicines:  angiotensin II receptor blockers (ARBs) like azilsartan, candesartan, eprosartan, irbesartan, losartan,  olmesartan, telmisartan, or valsartan  lithium  NSAIDS, medicines for pain and inflammation, like ibuprofen or naproxen  potassium-sparing diuretics like amiloride, spironolactone, and triamterene  potassium supplements This list may not describe all possible interactions. Give your health care provider a list of all the medicines, herbs, non-prescription drugs, or dietary supplements you use. Also tell them if you smoke, drink alcohol, or use illegal drugs. Some items may interact with your medicine. What should I watch for while using this medicine? Tell your doctor or healthcare professional if your symptoms do not start to get better or if they get worse. Do not become pregnant while taking this medicine. Women should inform their doctor if they wish to become pregnant or think they might be pregnant. There is a potential for serious side effects to an unborn child. Talk to your health care professional or pharmacist for more information. You may get dizzy. Do not drive, use machinery, or do anything that needs mental alertness until you know how this medicine affects you. Do not stand or sit up quickly, especially if you are an older patient. This reduces the risk of dizzy or fainting spells. Avoid alcoholic drinks; they can make you more dizzy. What side effects may I notice from receiving this medicine? Side effects that you should report to your doctor or health care professional as soon as possible:  allergic reactions like skin rash, itching or hives, swelling of the face, lips, or tongue  signs and symptoms of increased potassium like muscle weakness; chest pain; or fast, irregular heartbeat  signs and symptoms of kidney injury like trouble passing urine or change in the amount of urine  signs and symptoms of low blood pressure like feeling dizzy or lightheaded, or if you develop extreme fatigue Side effects that usually do not require medical attention (report to your doctor or  health care professional if they continue or are bothersome):  cough This list may not describe all possible side effects. Call your doctor for medical advice about side effects. You may report side effects to FDA at 1-800-FDA-1088. Where should I keep my medicine? Keep out of the reach of children and pets. Store at room temperature between 20 and 25 degrees C (68 and 77 degrees F). Protect from moisture. Keep the container tightly closed. Throw away any unused drug after the expiration date. NOTE: This sheet is a summary. It may not cover all possible information. If you have questions about this medicine, talk to your doctor, pharmacist, or health care provider.  2020 Elsevier/Gold Standard (2018-11-19 16:03:07)

## 2020-02-29 NOTE — Progress Notes (Signed)
Cardiology Office Note:    Date:  02/29/2020   ID:  Karen Patrick, DOB 02/26/1966, MRN [id number]  PCP:  Sharlene Dory, DO  Cardiologist:  Thomasene Ripple, DO  Electrophysiologist:  None   Referring MD: Sharlene Dory*   Chief Complaint  Patient presents with  . Follow-up    History of Present Illness:    Karen Patrick is a 54 y.o. female with a hx of diabetes mellitus, hypertension, hyperlipidemia, family history of premature coronary disease (father first MI in his 66s and mother died of MI 54),history of nonischemic cardiomyopathy with a EF of 35 to 40%2018and a left heart catheterization which was also normal. Most recent echocardiogram at the outside facility showed EF greater than 50%.  I saw the patient April 2021 at that time we had discussed her monitor results which showed frequent PVCs as well as 2 runs of nonsustained ventricular tachycardia.  Started on Cardizem and recommend she undergo cardiac MRI.  Unfortunately patient was unable to perform the study due to claustrophobia.  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  She did see EP.  However in the interim prior to see EP she had transition from Cardizem to Coreg due to the fact that she was convincing worsening palpitations.  Her palpitations had resolved therefore medical management was recommended to be continued.  I saw the patient on February 03, 2020 at that time she reported that she had been experiencing  shortness of breath on exertion as well as intermittent chest pain.  She described the pain as a squeezing sensation.  She did note that the pain  she was not able to walk close to a mile which she had on before.  Due to her change in symptoms of worsening shortness of breath and chest pain I recommended patient get a repeat echocardiogram as well as a coronary CTA.   In the interim  the patient will get this test and she is here today for follow-up visit.  She still does have some shortness of breath.   Past Medical History:  Diagnosis Date  . Acute pancreatitis 01/06/2014  . Acute systolic congestive heart failure (HCC)   . 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 Adjustment disorder with depressed mood 06/03/2016  . Ankle fracture, right   . Asthma   . Cardiomyopathy (HCC) 11/06/2017   Non-ischemic November 2018  . Depression   . Diabetes mellitus   . Drug overdose   . Essential hypertension 03/05/2017   Overview:  Added automatically from request for surgery [id number] Added automatically from request for surgery [phone]  . Fracture of right orbital floor with routine healing 10/12/2014  . 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 GERD (gastroesophageal reflux disease)   . History of hypertension 03/05/2017  . Injury of right ankle 11/02/2010   Formatting of this note might be different from the original. Last Assessment & Plan:  R ankle distal fibula avulsion fracture - significantly improved.  Add theraband and balance exercises moving forward (demonstrated today).  No evidence of DVT on exam - feel her pain in lateral calf and deep popliteal region is muscular - discussed warmth, redness, swelling - if occurs to seek care.  ASO for th  . Midline thoracic back pain 03/06/2017  . Mixed hyperlipidemia 08/26/2019  . Myocardial infarction (HCC)   . Nonischemic cardiomyopathy (HCC) 08/26/2019  . NSVT (nonsustained  ventricular tachycardia) (HCC) 07/03/2019  . Obesity (BMI 30-39.9) 08/26/2019  . Obstructive sleep apnea 06/26/2017   11/2017 PSG AHI: 5.4, REM AHI: 25, SpO2 nadir: 86% 04/2018 PAP PSG: CPAP 6cm  01/2019 started CPAP therapy  . Other hyperlipidemia 06/06/2018  . Other seasonal allergic rhinitis 03/30/2013   Formatting of this note might be different from the original. Overview:  Watery eyes and sinus pain  . PAT (paroxysmal atrial tachycardia) (HCC)  07/03/2019  . Right ankle injury 11/02/2010  . Schizoaffective disorder (HCC)   . Seasonal allergies 03/30/2013   Overview:  Watery eyes and sinus pain  . Severe obesity (BMI 35.0-39.9) with comorbidity (HCC) 05/27/2019  . Suicidal ideation   . Type 2 diabetes mellitus without complications (HCC) 02/26/2013  . Urinary tract infection 01/07/2014  . UTI (urinary tract infection), bacterial 01/07/2014    Past Surgical History:  Procedure Laterality Date  . BACK SURGERY    . CESAREAN SECTION    . ENDOMETRIAL ABLATION W/ NOVASURE    . FOOT FASCIOTOMY    . HAMMER TOE SURGERY    . KNEE ARTHROCENTESIS    . LIPOMA EXCISION      Current Medications: Current Meds  Medication Sig  . ascorbic acid (VITAMIN C) 500 MG tablet Take 500 mg by mouth daily.  Marland Kitchen atomoxetine (STRATTERA) 40 MG capsule Take 40 mg by mouth every morning.  Marland Kitchen atorvastatin (LIPITOR) 20 MG tablet Take 1 tablet (20 mg total) by mouth daily.  . carvedilol (COREG) 12.5 MG tablet Take 1 tablet (12.5 mg total) by mouth 2 (two) times daily with a meal.  . Cranberry 400 MG TABS Take 400 mg by mouth daily.  . dapagliflozin propanediol (FARXIGA) 10 MG TABS tablet Take 1 tablet (10 mg total) by mouth daily before breakfast.  . estrogen, conjugated,-medroxyprogesterone (PREMPRO) 0.3-1.5 MG tablet Take 1 tablet by mouth daily.  . insulin glargine (LANTUS SOLOSTAR) 100 UNIT/ML Solostar Pen Inject 20 Units into the skin daily.  Boris Lown Oil 1000 MG CAPS Take 1,000 mg by mouth daily.  Marland Kitchen liraglutide (VICTOZA) 18 MG/3ML SOPN Inject 1.8 mg into the skin daily.  . metFORMIN (GLUCOPHAGE-XR) 500 MG 24 hr tablet Take 2 tablets (1,000 mg total) by mouth 2 (two) times daily.  . Multiple Vitamin (MULTIVITAMIN) capsule Take 1 capsule by mouth daily.  . nitroGLYCERIN (NITROSTAT) 0.4 MG SL tablet Place 1 tablet (0.4 mg total) under the tongue every 5 (five) minutes as needed for chest pain.  Marland Kitchen spironolactone (ALDACTONE) 25 MG tablet Take 1 tablet (25 mg total)  by mouth daily.  Marland Kitchen torsemide (DEMADEX) 20 MG tablet TAKE 1 TABLET BY MOUTH DAILY  . [DISCONTINUED] losartan (COZAAR) 50 MG tablet Take 1.5 tablets (75 mg total) by mouth daily.  . [DISCONTINUED] potassium chloride (KLOR-CON) 10 MEQ tablet Take 1 tablet (10 mEq total) by mouth daily.     Allergies:   Patient has no known allergies.   Social History   Socioeconomic History  . Marital status: Divorced    Spouse name: Not on file  . Number of children: Not on file  . Years of education: Not on file  . Highest education level: Not on file  Occupational History  . Not on file  Tobacco Use  . Smoking status: Former Smoker    Types: Cigarettes    Start date: 1983    Quit date: 2006    Years since quitting: 15.8  . Smokeless tobacco: Never Used  Substance and Sexual Activity  .  Alcohol use: No  . Drug use: No  . Sexual activity: Yes    Birth control/protection: Surgical  Other Topics Concern  . Not on file  Social History Narrative  . Not on file   Social Determinants of Health   Financial Resource Strain:   . Difficulty of Paying Living Expenses: Not on file  Food Insecurity:   . Worried About Programme researcher, broadcasting/film/video in the Last Year: Not on file  . Ran Out of Food in the Last Year: Not on file  Transportation Needs:   . Lack of Transportation (Medical): Not on file  . Lack of Transportation (Non-Medical): Not on file  Physical Activity:   . Days of Exercise per Week: Not on file  . Minutes of Exercise per Session: Not on file  Stress:   . Feeling of Stress : Not on file  Social Connections:   . Frequency of Communication with Friends and Family: Not on file  . Frequency of Social Gatherings with Friends and Family: Not on file  . Attends Religious Services: Not on file  . Active Member of Clubs or Organizations: Not on file  . Attends Banker Meetings: Not on file  . Marital Status: Not on file     Family History: The patient's family history includes  Cancer in her mother and paternal aunt; Diabetes in her father, maternal grandmother, mother, and paternal grandmother; Heart attack in her father and maternal grandmother; Heart failure in her mother; Hyperlipidemia in her father; Hypertension in her father, maternal grandmother, and mother; Stroke in her father and mother; Sudden death in her father and paternal grandmother.  ROS:   Review of Systems  Constitution: Negative for decreased appetite, fever and weight gain.  HENT: Negative for congestion, ear discharge, hoarse voice and sore throat.   Eyes: Negative for discharge, redness, vision loss in right eye and visual halos.  Cardiovascular: Negative for chest pain, dyspnea on exertion, leg swelling, orthopnea and palpitations.  Respiratory: Negative for cough, hemoptysis, shortness of breath and snoring.   Endocrine: Negative for heat intolerance and polyphagia.  Hematologic/Lymphatic: Negative for bleeding problem. Does not bruise/bleed easily.  Skin: Negative for flushing, nail changes, rash and suspicious lesions.  Musculoskeletal: Negative for arthritis, joint pain, muscle cramps, myalgias, neck pain and stiffness.  Gastrointestinal: Negative for abdominal pain, bowel incontinence, diarrhea and excessive appetite.  Genitourinary: Negative for decreased libido, genital sores and incomplete emptying.  Neurological: Negative for brief paralysis, focal weakness, headaches and loss of balance.  Psychiatric/Behavioral: Negative for altered mental status, depression and suicidal ideas.  Allergic/Immunologic: Negative for HIV exposure and persistent infections.    EKGs/Labs/Other Studies Reviewed:    The following studies were reviewed today:   EKG: None today.  Transthoracic echocardiogram IMPRESSIONS    1. Left ventricular ejection fraction, by estimation, is 40 to 45%. The  left ventricle has mildly decreased function. Left ventricular endocardial  border not optimally defined  to evaluate regional wall motion. Left  ventricular diastolic parameters are  consistent with Grade I diastolic dysfunction (impaired relaxation).  2. Right ventricular systolic function is normal. The right ventricular  size is normal.  3. The mitral valve is normal in structure. No evidence of mitral valve  regurgitation. No evidence of mitral stenosis.  4. The aortic valve is normal in structure. Aortic valve regurgitation is  not visualized. No aortic stenosis is present.  5. The inferior vena cava is normal in size with greater than 50%  respiratory variability,  suggesting right atrial pressure of 3 mmHg.   Coronary CTA IMPRESSION: 1. Coronary calcium score of 0. This was 0 percentile for age and sex matched control.  2. Normal coronary origin with right dominance.  3. No evidence of CAD.  Recent Labs: 05/28/2019: Magnesium 1.6; TSH 0.982 07/23/2019: Hemoglobin 13.4; Platelets 218 02/05/2020: ALT 14; BUN 12; Creat 0.54; Potassium 4.1; Sodium 138  Recent Lipid Panel    Component Value Date/Time   CHOL 123 02/05/2020 1039   TRIG 61 02/05/2020 1039   HDL 57 02/05/2020 1039   CHOLHDL 2.2 02/05/2020 1039   LDLCALC 52 02/05/2020 1039    Physical Exam:    VS:  BP 110/70 (BP Location: Left Arm, Patient Position: Sitting, Cuff Size: Normal)   Pulse (!) 44   Ht 5' 2.5" (1.588 m)   Wt 189 lb (85.7 kg)   BMI 34.02 kg/m     Wt Readings from Last 3 Encounters:  02/29/20 189 lb (85.7 kg)  02/05/20 196 lb 8 oz (89.1 kg)  02/02/20 195 lb 12.8 oz (88.8 kg)     GEN: Well nourished, well developed in no acute distress HEENT: Normal NECK: No JVD; No carotid bruits LYMPHATICS: No lymphadenopathy CARDIAC: S1S2 noted,RRR, no murmurs, rubs, gallops RESPIRATORY:  Clear to auscultation without rales, wheezing or rhonchi  ABDOMEN: Soft, non-tender, non-distended, +bowel sounds, no guarding. EXTREMITIES: No edema, No cyanosis, no clubbing MUSCULOSKELETAL:  No deformity  SKIN:  Warm and dry NEUROLOGIC:  Alert and oriented x 3, non-focal PSYCHIATRIC:  Normal affect, good insight  ASSESSMENT:    1. Depressed left ventricular ejection fraction   2. Frequent PVCs   3. Essential hypertension   4. NSVT (nonsustained ventricular tachycardia) (HCC)   5. Nonischemic cardiomyopathy (HCC)   6. Obstructive sleep apnea   7. Type 2 diabetes mellitus without complication, without long-term current use of insulin (HCC)    PLAN:     Unfortunately her EF has dropped a bit compared to her previous echocardiogram which was done on May 01, 2019 which was reported to be 50%.  Her recent echo on February 10, 2020 which was 40 to 45%.  At this time we will stop her losartan and transition the patient to Entresto 24-26 mg twice daily.  I have educated patient about the new medication.  She will continue on carvedilol 12.5 mg twice a day, spironolactone 25 mg daily and she is also on Farxiga.  I am hoping that this change her regimen will help her LV function.  Her coronary CTA with 0 calcium and no evidence of coronary artery disease.  I suspect that PVC may be causing this depressed ejection fraction.  My plan is to put a monitor on the patient again as she did wear 1 back in January 2021 which showed frequent PVCs.  I like her to see EP in follow-up.  Her blood pressure deceptively in the office today.  In terms of her residual shortness of breath I have asked the patient given her history of asthma with her PCP to see if rescue inhalers will be of some help here.  OSA continue CPAP.  Diabetes mellitus per her PCP.  The patient understands the need to lose weight with diet and exercise. We have discussed specific strategies for this.  The patient is in agreement with the above plan. The patient left the office in stable condition.  The patient will follow up in 4 months or sooner if needed.   Medication Adjustments/Labs and Tests  Ordered: Current medicines are reviewed at  length with the patient today.  Concerns regarding medicines are outlined above.  Orders Placed This Encounter  Procedures  . Basic metabolic panel  . Magnesium   Meds ordered this encounter  Medications  . sacubitril-valsartan (ENTRESTO) 24-26 MG    Sig: Take 1 tablet by mouth 2 (two) times daily.    Dispense:  60 tablet    Refill:  3    Patient Instructions  Medication Instructions:  Your physician has recommended you make the following change in your medication:   STOP: Potassium STOP: Losartan  START: Entresto 24/26 twice daily   *If you need a refill on your cardiac medications before your next appointment, please call your pharmacy*   Lab Work: Your physician recommends that you return for lab work today: bmp, mg   If you have labs (blood work) drawn today and your tests are completely normal, you will receive your results only by: Marland Kitchen MyChart Message (if you have MyChart) OR . A paper copy in the mail If you have any lab test that is abnormal or we need to change your treatment, we will call you to review the results.   Testing/Procedures: None  [phone]  Follow-Up: At [facility], you and your health needs are our priority.  As part of our continuing mission to provide you with exceptional heart care, we have created designated Provider Care Teams.  These Care Teams include your primary Cardiologist (physician) and Advanced Practice Providers (APPs -  Physician Assistants and Nurse Practitioners) who all work together to provide you with the care you need, when you need it.  We recommend signing up for the patient portal called "MyChart".  Sign up information is provided on this After Visit Summary.  MyChart is used to connect with patients for Virtual Visits (Telemedicine).  Patients are able to view lab/test results, encounter notes, upcoming appointments, etc.  Non-urgent messages can be sent to your provider as well.   To learn more about what you can do  with MyChart, go to [email or web address].    Your next appointment:   4 month(s)  The format for your next appointment:   In Person  Provider:   Thomasene Ripple, DO   Other Instructions  Sacubitril; Valsartan Oral Tablets What is this medicine? SACUBITRIL; VALSARTAN (sak UE bi tril; val SAR tan) is a combination of a neprilysin inhibitor and a an angiotensin II receptor blocker. It treats heart failure. This medicine may be used for other purposes; ask your health care provider or pharmacist if you have questions. COMMON BRAND NAME(S): Entresto What should I tell my health care provider before I take this medicine? They need to know if you have any of these conditions:  diabetes and take a medicine that contains aliskiren  kidney disease  liver disease  an unusual or allergic reaction to sacubitril; valsartan, drugs called angiotensin converting enzyme (ACE) inhibitors, angiotensin II receptor blockers (ARBs), other medicines, foods, dyes, or preservatives  pregnant or trying to get pregnant  breast-feeding How should I use this medicine? Take this drug by mouth. Take it as directed on the prescription label at the same time every day. You can take it with or without food. If it upsets your stomach, take it with food. Keep taking it unless your health care provider tells you to stop. Talk to your health care provider about the use of this drug in children. While it may be prescribed for children as young as 1  for selected conditions, precautions do apply. Overdosage: If you think you have taken too much of this medicine contact a poison control center or emergency room at once. NOTE: This medicine is only for you. Do not share this medicine with others. What if I miss a dose? If you miss a dose, take it as soon as you can. If it is almost time for your next dose, take only that dose. Do not take double or extra doses. What may interact with this medicine? Do not take this  medicine with any of the following medicines:  aliskiren if you have diabetes  angiotensin-converting enzyme (ACE) inhibitors, like benazepril, captopril, enalapril, fosinopril, lisinopril, or ramipril This medicine may also interact with the following medicines:  angiotensin II receptor blockers (ARBs) like azilsartan, candesartan, eprosartan, irbesartan, losartan, olmesartan, telmisartan, or valsartan  lithium  NSAIDS, medicines for pain and inflammation, like ibuprofen or naproxen  potassium-sparing diuretics like amiloride, spironolactone, and triamterene  potassium supplements This list may not describe all possible interactions. Give your health care provider a list of all the medicines, herbs, non-prescription drugs, or dietary supplements you use. Also tell them if you smoke, drink alcohol, or use illegal drugs. Some items may interact with your medicine. What should I watch for while using this medicine? Tell your doctor or healthcare professional if your symptoms do not start to get better or if they get worse. Do not become pregnant while taking this medicine. Women should inform their doctor if they wish to become pregnant or think they might be pregnant. There is a potential for serious side effects to an unborn child. Talk to your health care professional or pharmacist for more information. You may get dizzy. Do not drive, use machinery, or do anything that needs mental alertness until you know how this medicine affects you. Do not stand or sit up quickly, especially if you are an older patient. This reduces the risk of dizzy or fainting spells. Avoid alcoholic drinks; they can make you more dizzy. What side effects may I notice from receiving this medicine? Side effects that you should report to your doctor or health care professional as soon as possible:  allergic reactions like skin rash, itching or hives, swelling of the face, lips, or tongue  signs and symptoms of  increased potassium like muscle weakness; chest pain; or fast, irregular heartbeat  signs and symptoms of kidney injury like trouble passing urine or change in the amount of urine  signs and symptoms of low blood pressure like feeling dizzy or lightheaded, or if you develop extreme fatigue Side effects that usually do not require medical attention (report to your doctor or health care professional if they continue or are bothersome):  cough This list may not describe all possible side effects. Call your doctor for medical advice about side effects. You may report side effects to FDA at 1-800-FDA-1088. Where should I keep my medicine? Keep out of the reach of children and pets. Store at room temperature between 20 and 25 degrees C (68 and 77 degrees F). Protect from moisture. Keep the container tightly closed. Throw away any unused drug after the expiration date. NOTE: This sheet is a summary. It may not cover all possible information. If you have questions about this medicine, talk to your doctor, pharmacist, or health care provider.  2020 Elsevier/Gold Standard (2018-11-19 16:03:07)      Adopting a Healthy Lifestyle.  Know what a healthy weight is for you (roughly BMI <25) and aim to  maintain this   Aim for 7+ servings of fruits and vegetables daily   65-80+ fluid ounces of water or unsweet tea for healthy kidneys   Limit to max 1 drink of alcohol per day; avoid smoking/tobacco   Limit animal fats in diet for cholesterol and heart health - choose grass fed whenever available   Avoid highly processed foods, and foods high in saturated/trans fats   Aim for low stress - take time to unwind and care for your mental health   Aim for 150 min of moderate intensity exercise weekly for heart health, and weights twice weekly for bone health   Aim for 7-9 hours of sleep daily   When it comes to diets, agreement about the perfect plan isnt easy to find, even among the experts. Experts  at the [facility] [employer] developed an idea known as the Healthy Eating Plate. Just imagine a plate divided into logical, healthy portions.   The emphasis is on diet quality:   Load up on vegetables and fruits - one-half of your plate: Aim for color and variety, and remember that potatoes dont count.   Go for whole grains - one-quarter of your plate: Whole wheat, barley, wheat berries, quinoa, oats, brown rice, and foods made with them. If you want pasta, go with whole wheat pasta.   Protein power - one-quarter of your plate: Fish, chicken, beans, and nuts are all healthy, versatile protein sources. Limit red meat.   The diet, however, does go beyond the plate, offering a few other suggestions.   Use healthy plant oils, such as olive, canola, soy, corn, sunflower and peanut. Check the labels, and avoid partially hydrogenated oil, which have unhealthy trans fats.   If youre thirsty, drink water. Coffee and tea are good in moderation, but skip sugary drinks and limit milk and dairy products to one or two daily servings.   The type of carbohydrate in the diet is more important than the amount. Some sources of carbohydrates, such as vegetables, fruits, whole grains, and beans-are healthier than others.   Finally, stay active  Signed, Thomasene Ripple, DO  02/29/2020 8:40 AM    [facility]

## 2020-03-01 ENCOUNTER — Telehealth: Payer: Self-pay

## 2020-03-01 ENCOUNTER — Telehealth: Payer: Self-pay | Admitting: Family Medicine

## 2020-03-01 LAB — BASIC METABOLIC PANEL
BUN/Creatinine Ratio: 19 (ref 9–23)
BUN: 14 mg/dL (ref 6–24)
CO2: 22 mmol/L (ref 20–29)
Calcium: 9.6 mg/dL (ref 8.7–10.2)
Chloride: 98 mmol/L (ref 96–106)
Creatinine, Ser: 0.73 mg/dL (ref 0.57–1.00)
GFR calc Af Amer: 108 mL/min/{1.73_m2} (ref >59)
GFR calc non Af Amer: 94 mL/min/{1.73_m2} (ref >59)
Glucose: 158 mg/dL — ABNORMAL HIGH (ref 65–99)
Potassium: 3.9 mmol/L (ref 3.5–5.2)
Sodium: 138 mmol/L (ref 134–144)

## 2020-03-01 LAB — MAGNESIUM: Magnesium: 1.9 mg/dL (ref 1.6–2.3)

## 2020-03-01 NOTE — Telephone Encounter (Signed)
Received Patient Assistance refill for Lantus Completed refill request Faxed completed form to [phone] Fax confirmation received

## 2020-03-01 NOTE — Telephone Encounter (Signed)
Spoke with patient regarding results and recommendation.  Patient verbalizes understanding and is agreeable to plan of care. Advised patient to call back with any issues or concerns.  

## 2020-03-01 NOTE — Telephone Encounter (Signed)
-----   Message from Thomasene Ripple, DO sent at 02/26/2020 11:59 PM EDT ----- Randie Heinz news calcium score 0 no evidence of coronary artery disease.

## 2020-03-08 NOTE — Telephone Encounter (Signed)
Called informed the patient received lantus Patient assistance. Is in the frig in the back of nurses station Received lantus 100 units/ml Includes five 3 ml prefilled pens. Patient will pickup at the front desk.Karen Patrick

## 2020-03-09 NOTE — Telephone Encounter (Signed)
Received today 03/09/2020 #3 boxes of Prempro #28 tablets. From [employer] patient assistance. Called the patient informed medication has arrived in our office. Will put in a bag and will be at the front for her to pickup at her convenience.

## 2020-03-21 ENCOUNTER — Other Ambulatory Visit: Payer: Self-pay | Admitting: Family Medicine

## 2020-04-03 ENCOUNTER — Other Ambulatory Visit: Payer: Self-pay | Admitting: Cardiology

## 2020-04-07 ENCOUNTER — Telehealth: Payer: Self-pay | Admitting: Family Medicine

## 2020-04-07 NOTE — Telephone Encounter (Signed)
Patient sent message wanting to get done  Hepatitis C Screening Hemoglobin A1c  Please advise

## 2020-04-08 NOTE — Telephone Encounter (Signed)
Patient informed of PCP response and agreed to wait until appt next month

## 2020-04-08 NOTE — Telephone Encounter (Signed)
OK to order Hep C, though we could wait until next mo. She has appt next mo where we will be checking A1c, we checked in in Oct. Ty.

## 2020-04-19 ENCOUNTER — Telehealth: Payer: Self-pay | Admitting: Family Medicine

## 2020-04-19 NOTE — Telephone Encounter (Signed)
Received patient assistance for Victoza #4 boxes--3 pens each. Patient informed ready for pickup at the front desk.

## 2020-05-02 ENCOUNTER — Telehealth: Payer: No Typology Code available for payment source | Admitting: Family

## 2020-05-02 DIAGNOSIS — U071 COVID-19: Secondary | ICD-10-CM

## 2020-05-02 NOTE — Progress Notes (Signed)
E-Visit for Corona Virus Screening  Your current symptoms could be consistent with the coronavirus.  Many health care providers can now test patients at their office but not all are.  [facility] has multiple testing sites. For information on our COVID testing locations and hours go to [email or web address]  We are enrolling you in our MyChart Home Monitoring for COVID19 . Daily you will receive a questionnaire within the MyChart website. Our COVID 19 response team will be monitoring your responses daily.  Testing Information: The COVID-19 Community Testing sites are testing BY APPOINTMENT ONLY.  You can schedule online at [email or web address]  If you do not have access to a smart phone or computer you may call [phone] for an appointment.   Additional testing sites in the Community:  . For [facility] Testing sites in [place]  [email or web address]  . For Pop-up testing sites in [place]  [email or web address]  . For [facility] [email or web address]  . For [facility] testing in [place] and [employer] [email or web address]  . For [facility] testing in [facility]   [email or web address]  For  more information about community testing call [phone]   Please quarantine yourself while awaiting your test results. Please stay home for a minimum of 10 days from the first day of illness with improving symptoms and you have had 24 hours of no fever (without the use of Tylenol (Acetaminophen) Motrin (Ibuprofen) or any fever reducing medication).  Also - Do not get tested prior to returning to work because once you have had a positive test the test can stay  positive for more than a month in some cases.   You should wear a mask or cloth face covering over your nose and mouth if you must be around other people or animals, including pets (even at home). Try to stay at least 6 feet away from other people. This will protect the people around you.  Please continue good preventive care measures, including:  frequent hand-washing, avoid touching your face, cover coughs/sneezes, stay out of crowds and keep a 6 foot distance from others.  COVID-19 is a respiratory illness with symptoms that are similar to the flu. Symptoms are typically mild to moderate, but there have been cases of severe illness and death due to the virus.   The following symptoms may appear 2-14 days after exposure: . Fever . Cough . Shortness of breath or difficulty breathing . Chills . Repeated shaking with chills . Muscle pain . Headache . Sore throat . New loss of taste or smell . Fatigue . Congestion or runny nose . Nausea or vomiting . Diarrhea  Go to the nearest hospital ED for assessment if fever/cough/breathlessness are severe or illness seems like a threat to life.  It is vitally important that if you feel that you have an infection such as this virus or any other virus that you stay home and away from places where you may spread it to others.  You should avoid contact with people age 79 and older.   Work note was sent to [employer].   You may also take acetaminophen (Tylenol) as needed for fever.  Reduce your risk of any infection by using the same precautions used for avoiding the common cold or flu:  Marland Kitchen Wash your hands often with soap and warm water for at least 20 seconds.  If soap and water are not readily available, use an alcohol-based hand sanitizer with at least 60% alcohol.  . If coughing or sneezing, cover  your mouth and nose by coughing or sneezing into the elbow areas of your shirt or coat, into a tissue or into your sleeve (not your hands). . Avoid shaking  hands with others and consider head nods or verbal greetings only. . Avoid touching your eyes, nose, or mouth with unwashed hands.  . Avoid close contact with people who are sick. . Avoid places or events with large numbers of people in one location, like concerts or sporting events. . Carefully consider travel plans you have or are making. . If you are planning any travel outside or inside the [place], visit the CDC's Travelers' Health webpage for the latest health notices. . If you have some symptoms but not all symptoms, continue to monitor at home and seek medical attention if your symptoms worsen. . If you are having a medical emergency, call 911.  HOME CARE . Only take medications as instructed by your medical team. . Drink plenty of fluids and get plenty of rest. . A steam or ultrasonic humidifier can help if you have congestion.   GET HELP RIGHT AWAY IF YOU HAVE EMERGENCY WARNING SIGNS** FOR COVID-19. If you or someone is showing any of these signs seek emergency medical care immediately. Call 911 or proceed to your closest emergency facility if: . You develop worsening high fever. . Trouble breathing . Bluish lips or face . Persistent pain or pressure in the chest . New confusion . Inability to wake or stay awake . You cough up blood. . Your symptoms become more severe  **This list is not all possible symptoms. Contact your medical provider for any symptoms that are sever or concerning to you.  MAKE SURE YOU   Understand these instructions.  Will watch your condition.  Will get help right away if you are not doing well or get worse.  Your e-visit answers were reviewed by a board certified advanced clinical practitioner to complete your personal care plan.  Depending on the condition, your plan could have included both over the counter or prescription medications.  If there is a problem please reply once you have received a response from your provider.  Your safety is important  to [place].  If you have drug allergies check your prescription carefully.    You can use MyChart to ask questions about today's visit, request a non-urgent call back, or ask for a work or school excuse for 24 hours related to this e-Visit. If it has been greater than 24 hours you will need to follow up with your provider, or enter a new e-Visit to address those concerns. You will get an e-mail in the next two days asking about your experience.  I hope that your e-visit has been valuable and will speed your recovery. Thank you for using e-visits.  Approximately 5 minutes was spent documenting and reviewing patient's chart.

## 2020-05-05 ENCOUNTER — Telehealth: Payer: Self-pay

## 2020-05-05 NOTE — Telephone Encounter (Signed)
Received patients insulin (Lantus) for Patient assistance Program. Called and let patient know. She will pick up. Insulin placed in fridge in labeled bag.

## 2020-05-09 ENCOUNTER — Other Ambulatory Visit: Payer: Self-pay

## 2020-05-10 ENCOUNTER — Other Ambulatory Visit: Payer: Self-pay

## 2020-05-10 ENCOUNTER — Ambulatory Visit (INDEPENDENT_AMBULATORY_CARE_PROVIDER_SITE_OTHER): Payer: Self-pay | Admitting: Family Medicine

## 2020-05-10 ENCOUNTER — Encounter: Payer: Self-pay | Admitting: Family Medicine

## 2020-05-10 VITALS — BP 108/62 | HR 86 | Temp 98.2°F | Ht 62.5 in | Wt 200.0 lb

## 2020-05-10 DIAGNOSIS — Z794 Long term (current) use of insulin: Secondary | ICD-10-CM

## 2020-05-10 DIAGNOSIS — E1165 Type 2 diabetes mellitus with hyperglycemia: Secondary | ICD-10-CM

## 2020-05-10 LAB — HEMOGLOBIN A1C: Hgb A1c MFr Bld: 8.2 % — ABNORMAL HIGH (ref 4.6–6.5)

## 2020-05-10 NOTE — Patient Instructions (Addendum)
Stop the Lantus for the next week. If the sugars are >150 in the morning, add 5 units. If still >150, add 3 units every 3 days. Send me some readings in the next few weeks if any questions.   Call Center for [facility] at [facility] at [phone] for an appointment.  They are located at [address], [place], [place], [address] (right across the hall from our office).  Keep the diet clean and stay active.  Give [place] 2-3 business days to get the results of your labs back.   Let us know if you need anything.

## 2020-05-10 NOTE — Progress Notes (Signed)
Subjective:   Chief Complaint  Patient presents with  . Follow-up    Karen Patrick is a 55 y.o. female here for follow-up of diabetes.   Karen Patrick's self monitored glucose range is mid 100's.  Patient confirms hypoglycemic reactions- twice per 3 mo. She checks her glucose levels 2 time(s) per day. Patient does require insulin. She is on Lantus 20 u qhs. Medications include: Metformin XR 1000 mg bid, Farxiga 10 mg/d, Victoza 1.8 mg/d Diet is fair.  Exercise: HIIT, cardio  Past Medical History:  Diagnosis Date  . Acute pancreatitis 01/06/2014  . Acute systolic congestive heart failure (HCC)   . 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 Adjustment disorder with depressed mood 06/03/2016  . Ankle fracture, right   . Asthma   . Cardiomyopathy (HCC) 11/06/2017   Non-ischemic November 2018  . Depression   . Diabetes mellitus   . Drug overdose   . Essential hypertension 03/05/2017   Overview:  Added automatically from request for surgery [id number] Added automatically from request for surgery [phone]  . Fracture of right orbital floor with routine healing 10/12/2014  . 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 GERD (gastroesophageal reflux disease)   . History of hypertension 03/05/2017  . Injury of right ankle 11/02/2010   Formatting of this note might be different from the original. Last Assessment & Plan:  R ankle distal fibula avulsion fracture - significantly improved.  Add theraband and balance exercises moving forward (demonstrated today).  No evidence of DVT on exam - feel her pain in lateral calf and deep popliteal region is muscular - discussed warmth, redness, swelling - if occurs to seek care.  ASO for th  . Midline thoracic back pain 03/06/2017  . Mixed hyperlipidemia 08/26/2019  . Myocardial infarction (HCC)   . Nonischemic cardiomyopathy (HCC) 08/26/2019  . NSVT (nonsustained ventricular tachycardia) (HCC) 07/03/2019  . Obesity (BMI 30-39.9) 08/26/2019  . Obstructive  sleep apnea 06/26/2017   11/2017 PSG AHI: 5.4, REM AHI: 25, SpO2 nadir: 86% 04/2018 PAP PSG: CPAP 6cm  01/2019 started CPAP therapy  . Other hyperlipidemia 06/06/2018  . Other seasonal allergic rhinitis 03/30/2013   Formatting of this note might be different from the original. Overview:  Watery eyes and sinus pain  . PAT (paroxysmal atrial tachycardia) (HCC) 07/03/2019  . Right ankle injury 11/02/2010  . Schizoaffective disorder (HCC)   . Seasonal allergies 03/30/2013   Overview:  Watery eyes and sinus pain  . Severe obesity (BMI 35.0-39.9) with comorbidity (HCC) 05/27/2019  . Suicidal ideation   . Type 2 diabetes mellitus without complications (HCC) 02/26/2013  . Urinary tract infection 01/07/2014  . UTI (urinary tract infection), bacterial 01/07/2014     Related testing: Retinal exam: Done Pneumovax: done  Objective:  BP 108/62 (BP Location: Left Arm, Patient Position: Sitting, Cuff Size: Normal)   Pulse 86   Temp 98.2 F (36.8 C) (Oral)   Ht 5' 2.5" (1.588 m)   Wt 200 lb (90.7 kg)   SpO2 99%   BMI 36.00 kg/m  General:  Well developed, well nourished, in no apparent distress Eyes:  Pupils equal and round, sclera anicteric without injection  Lungs:  CTAB, no access msc use Cardio:  RRR, no bruits, no LE edema Psych: Age appropriate judgment and insight  Assessment:   Type 2 diabetes mellitus with hyperglycemia, with long-term current use of insulin (HCC) - Plan: Hemoglobin A1c   Plan:   Status: Chronic, uncontrolled. Goal A1c is <7, last one 3  mo ago was 7.9. Counseled on diet and exercise. Cont Victoza 1.8 mg/d, metformin XR 1000 mg twice daily, Farxiga 10 mg daily.  Because she has not been on the [place] over the past 3 months but has had normal sugars for controlled diabetic, we will stop her insulin, Lantus 20 units nightly.  She will monitor her morning sugars and if she is greater than 150, she will add back Lantus.  She will start with 5 units initially and then add back 3 units  every 3 days.  She will send me readings in the next several weeks. F/u in 3 to 6 months pending above. The patient voiced understanding and agreement to the plan.  Jilda Roche [place], DO 05/10/20 8:16 AM

## 2020-05-17 DIAGNOSIS — K219 Gastro-esophageal reflux disease without esophagitis: Secondary | ICD-10-CM | POA: Insufficient documentation

## 2020-05-17 DIAGNOSIS — I219 Acute myocardial infarction, unspecified: Secondary | ICD-10-CM | POA: Insufficient documentation

## 2020-05-17 DIAGNOSIS — F32A Depression, unspecified: Secondary | ICD-10-CM | POA: Insufficient documentation

## 2020-05-17 DIAGNOSIS — J45909 Unspecified asthma, uncomplicated: Secondary | ICD-10-CM | POA: Insufficient documentation

## 2020-05-17 DIAGNOSIS — I5021 Acute systolic (congestive) heart failure: Secondary | ICD-10-CM | POA: Insufficient documentation

## 2020-05-17 DIAGNOSIS — S82891A Other fracture of right lower leg, initial encounter for closed fracture: Secondary | ICD-10-CM | POA: Insufficient documentation

## 2020-05-19 ENCOUNTER — Ambulatory Visit: Payer: Self-pay | Admitting: Cardiology

## 2020-05-21 ENCOUNTER — Other Ambulatory Visit: Payer: Self-pay | Admitting: Family Medicine

## 2020-05-23 ENCOUNTER — Other Ambulatory Visit: Payer: Self-pay | Admitting: Family Medicine

## 2020-05-24 MED ORDER — ATOMOXETINE HCL 40 MG PO CAPS: 40.0000 mg | ORAL_CAPSULE | Freq: Every morning | ORAL | 2 refills | Status: DC

## 2020-06-11 ENCOUNTER — Other Ambulatory Visit: Payer: Self-pay | Admitting: Family Medicine

## 2020-06-13 ENCOUNTER — Other Ambulatory Visit: Payer: Self-pay

## 2020-06-13 ENCOUNTER — Encounter: Payer: Self-pay | Admitting: Family Medicine

## 2020-06-13 ENCOUNTER — Ambulatory Visit: Payer: Self-pay | Admitting: Family Medicine

## 2020-06-13 ENCOUNTER — Ambulatory Visit: Payer: BC Managed Care – PPO | Admitting: Family Medicine

## 2020-06-13 VITALS — BP 108/62 | HR 84 | Temp 98.5°F | Ht 62.5 in | Wt 194.0 lb

## 2020-06-13 DIAGNOSIS — E1165 Type 2 diabetes mellitus with hyperglycemia: Secondary | ICD-10-CM | POA: Diagnosis not present

## 2020-06-13 DIAGNOSIS — Z794 Long term (current) use of insulin: Secondary | ICD-10-CM | POA: Diagnosis not present

## 2020-06-13 MED ORDER — OZEMPIC (1 MG/DOSE) 4 MG/3ML ~~LOC~~ SOPN: 1.0000 mg | PEN_INJECTOR | SUBCUTANEOUS | 1 refills | Status: DC

## 2020-06-13 NOTE — Progress Notes (Signed)
CC: DM f/u  Subjective: Patient is a 55 y.o. female here for f/u DM.  Got a new glucometer that is more accurate. She finds her sugars have actually been higher than she thinks. Her diet is better. Her sugars have been running in the low 100's. Pt has been doing HIIT.  She is compliant w Marcelline Deist 10 mg/d, Victoza 1.8 mg/d, Metformin XR 1000 mg bid. She is no longer on insulin and has not had any low's since then.    Past Medical History:  Diagnosis Date  . Acute pancreatitis 01/06/2014  . Acute systolic congestive heart failure (HCC)   . 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 Adjustment disorder with depressed mood 06/03/2016  . Ankle fracture, right   . Asthma   . Cardiomyopathy (HCC) 11/06/2017   Non-ischemic November 2018  . Depression   . Diabetes mellitus   . Drug overdose   . Essential hypertension 03/05/2017   Overview:  Added automatically from request for surgery [id number] Added automatically from request for surgery [phone] [phone]  . Fracture of right orbital floor with routine healing 10/12/2014  . 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 GERD (gastroesophageal reflux disease)   . History of hypertension 03/05/2017  . Injury of right ankle 11/02/2010   Formatting of this note might be different from the original. Last Assessment & Plan:  R ankle distal fibula avulsion fracture - significantly improved.  Add theraband and balance exercises moving forward (demonstrated today).  No evidence of DVT on exam - feel her pain in lateral calf and deep popliteal region is muscular - discussed warmth, redness, swelling - if occurs to seek care.  ASO for th  . Midline thoracic back pain 03/06/2017  . Mixed hyperlipidemia 08/26/2019  . Myocardial infarction (HCC)   . Nonischemic cardiomyopathy (HCC) 08/26/2019  . NSVT (nonsustained ventricular tachycardia) (HCC) 07/03/2019  . Obesity (BMI 30-39.9) 08/26/2019  . Obstructive sleep apnea 06/26/2017   11/2017 PSG AHI: 5.4, REM AHI: 25, SpO2  nadir: 86% 04/2018 PAP PSG: CPAP 6cm  01/2019 started CPAP therapy  . Other hyperlipidemia 06/06/2018  . Other seasonal allergic rhinitis 03/30/2013   Formatting of this note might be different from the original. Overview:  Watery eyes and sinus pain  . PAT (paroxysmal atrial tachycardia) (HCC) 07/03/2019  . Right ankle injury 11/02/2010  . Schizoaffective disorder (HCC)   . Seasonal allergies 03/30/2013   Overview:  Watery eyes and sinus pain  . Severe obesity (BMI 35.0-39.9) with comorbidity (HCC) 05/27/2019  . Suicidal ideation   . Type 2 diabetes mellitus without complications (HCC) 02/26/2013  . Urinary tract infection 01/07/2014  . UTI (urinary tract infection), bacterial 01/07/2014    Objective: BP 108/62 (BP Location: Left Arm, Patient Position: Sitting, Cuff Size: Normal)   Pulse 84   Temp 98.5 F (36.9 C) (Oral)   Ht 5' 2.5" (1.588 m)   Wt 194 lb (88 kg)   SpO2 98%   BMI 34.92 kg/m  General: Awake, appears stated age Heart: RRR, no LE edema Lungs: CTAB, no rales, wheezes or rhonchi. No accessory muscle use Psych: Age appropriate judgment and insight, normal affect and mood  Assessment and Plan: Type 2 diabetes mellitus with hyperglycemia, with long-term current use of insulin (HCC) - Plan: Semaglutide, 1 MG/DOSE, (OZEMPIC, 1 MG/DOSE,) 4 MG/3ML SOPN  Chronic, uncontrolled. Cont Metformin XR 1000 mg bid, Farxiga 10 mg/d. Will see if ins covers Ozempic 1 mg weekly to replace Victoza 1.8 mg/d. Counseled on diet/exercise, I  want some readings in PM. She will let me know if >200 consistently and we will start Actos if so.  F/u in 2 mo.  The patient voiced understanding and agreement to the plan.  Jilda Roche [place], DO 06/13/20  2:28 PM

## 2020-06-13 NOTE — Patient Instructions (Addendum)
Keep the diet clean and stay active.  Check your sugars around 2-3 times per week. Alternate checking in the morning before you eat, in the afternoon and before bed. Write them down and bring it to your next appointment. I want more evening/afternoon readings. If those afternoon/evening readings are >200 consistently, please let me know.   Aim to do some physical exertion for 150 minutes per week. This is typically divided into 5 days per week, 30 minutes per day. The activity should be enough to get your heart rate up. Anything is better than nothing if you have time constraints.  Let us know if you need anything.

## 2020-06-14 ENCOUNTER — Ambulatory Visit: Payer: Self-pay | Admitting: Family Medicine

## 2020-06-24 ENCOUNTER — Other Ambulatory Visit: Payer: Self-pay

## 2020-06-24 ENCOUNTER — Encounter: Payer: Self-pay | Admitting: Physician Assistant

## 2020-06-24 ENCOUNTER — Telehealth (INDEPENDENT_AMBULATORY_CARE_PROVIDER_SITE_OTHER): Payer: BC Managed Care – PPO | Admitting: Family Medicine

## 2020-06-24 ENCOUNTER — Encounter: Payer: Self-pay | Admitting: Family Medicine

## 2020-06-24 ENCOUNTER — Telehealth: Payer: BC Managed Care – PPO | Admitting: Physician Assistant

## 2020-06-24 VITALS — Temp 97.6°F

## 2020-06-24 DIAGNOSIS — J40 Bronchitis, not specified as acute or chronic: Secondary | ICD-10-CM

## 2020-06-24 DIAGNOSIS — Z20822 Contact with and (suspected) exposure to covid-19: Secondary | ICD-10-CM

## 2020-06-24 MED ORDER — PROMETHAZINE-DM 6.25-15 MG/5ML PO SYRP: 5.0000 mL | ORAL_SOLUTION | Freq: Four times a day (QID) | ORAL | 0 refills | Status: DC | PRN

## 2020-06-24 MED ORDER — PREDNISONE 20 MG PO TABS: 40.0000 mg | ORAL_TABLET | Freq: Every day | ORAL | 0 refills | Status: AC

## 2020-06-24 MED ORDER — ONDANSETRON 4 MG PO TBDP: 4.0000 mg | ORAL_TABLET | Freq: Three times a day (TID) | ORAL | 0 refills | Status: DC | PRN

## 2020-06-24 MED ORDER — BENZONATATE 100 MG PO CAPS: 100.0000 mg | ORAL_CAPSULE | Freq: Three times a day (TID) | ORAL | 0 refills | Status: DC | PRN

## 2020-06-24 NOTE — Progress Notes (Signed)
[name], Karen Patrick are scheduled for a virtual visit with your provider today.    Just as we do with appointments in the office, we must obtain your consent to participate.  Your consent will be active for this visit and any virtual visit you may have with one of our providers in the next 365 days.    If you have a MyChart account, I can also send a copy of this consent to you electronically.  All virtual visits are billed to your insurance company just like a traditional visit in the office.  As this is a virtual visit, video technology does not allow for your provider to perform a traditional examination.  This may limit your provider's ability to fully assess your condition.  If your provider identifies any concerns that need to be evaluated in person or the need to arrange testing such as labs, EKG, etc, we will make arrangements to do so.    Although advances in technology are sophisticated, we cannot ensure that it will always work on either your end or our end.  If the connection with a video visit is poor, we may have to switch to a telephone visit.  With either a video or telephone visit, we are not always able to ensure that we have a secure connection.   I need to obtain your verbal consent now.   Are you willing to proceed with your visit today?   Karen Patrick has provided verbal consent on 06/24/2020 for a virtual visit (video or telephone).  Piedad Climes, PA-C 06/24/2020  11:46 AM  Virtual Visit via Video   I connected with patient on 06/24/20 at 12:00 PM EST by a video enabled telemedicine application and verified that I am speaking with the correct person using two identifiers.  Location patient: Home Location provider: [facility] Persons participating in the virtual visit: Patient, Provider  I discussed the limitations of evaluation and management by telemedicine and the availability of in person appointments. The patient expressed understanding and agreed to  proceed.  Subjective:   HPI:   Patient presents via [facility] today complaining of 3 days of nasal congestion with postnasal drip and significant cough that is mainly dry but is associated with some mild chest congestion.  Patient noted symptoms started pretty quickly.  Notes her grandson has similar symptoms.  They both tested for Covid on 06/22/2020 with home test, both negative.  She denies any fever or chills but notes body aches.  Denies sinus pain, ear pain or tooth pain.  Denies any true chest pain but sometimes having slight chest tightness.  Notes that the amount of drainage is causing her to dry heave and actually vomit last night.  Notes makes her hesitant to eat.  Patient with history of prior MI and heart failure, currently on a regimen of torsemide, Entresto, carvedilol, and spironolactone.  Notes she has continued all of these except for her spironolactone.  Notes chronic peripheral edema given her cardiac history.  Denies any worsening of this.  Also a type I diabetic, currently on Farxiga which she has been holding since symptom onset.  Notes glucose is running stable.  Is able to keep well-hydrated without difficulty.  Notes good urine Neri output with light urine.  Denies any diarrhea or abdominal pain.Has had her Pfizer Covid vaccines.  Also up-to-date on influenza vaccine.  She does note she has reached out to her primary care provider as well and is waiting on a call  back.     ROS:   See pertinent positives and negatives per HPI.  Patient Active Problem List   Diagnosis Date Noted  . Myocardial infarction (HCC)   . GERD (gastroesophageal reflux disease)   . Depression   . Asthma   . Ankle fracture, right   . Acute systolic congestive heart failure (HCC)   . Nonischemic cardiomyopathy (HCC) 08/26/2019  . Mixed hyperlipidemia 08/26/2019  . Obesity (BMI 30-39.9) 08/26/2019  . PAT (paroxysmal atrial tachycardia) (HCC) 07/03/2019  . 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 NSVT  (nonsustained ventricular tachycardia) (HCC) 07/03/2019  . Severe obesity (BMI 35.0-39.9) with comorbidity (HCC) 05/27/2019  . Other hyperlipidemia 06/06/2018  . 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 Cardiomyopathy (HCC) 11/06/2017  . Obstructive sleep apnea 06/26/2017  . Midline thoracic back pain 03/06/2017  . Essential hypertension 03/05/2017  . History of hypertension 03/05/2017  . Adjustment disorder with depressed mood 06/03/2016  . Drug overdose   . Suicidal ideation   . Fracture of right orbital floor with routine healing 10/12/2014  . Urinary tract infection 01/07/2014  . UTI (urinary tract infection), bacterial 01/07/2014  . Acute pancreatitis 01/06/2014  . Seasonal allergies 03/30/2013  . Other seasonal allergic rhinitis 03/30/2013  . Type 2 diabetes mellitus with hyperglycemia, with long-term current use of insulin (HCC) 02/26/2013  . Type 2 diabetes mellitus without complications (HCC) 02/26/2013  . Schizoaffective disorder (HCC) 06/04/2012  . Injury of right ankle 11/02/2010  . Right ankle injury 11/02/2010    Social History   Tobacco Use  . Smoking status: Former Smoker    Types: Cigarettes    Start date: 1983    Quit date: 2006    Years since quitting: 16.1  . Smokeless tobacco: Never Used  Substance Use Topics  . Alcohol use: No    Current Outpatient Medications:  .  ascorbic acid (VITAMIN C) 500 MG tablet, Take 500 mg by mouth daily., Disp: , Rfl:  .  atomoxetine (STRATTERA) 40 MG capsule, Take 1 capsule (40 mg total) by mouth every morning., Disp: 90 capsule, Rfl: 2 .  atorvastatin (LIPITOR) 20 MG tablet, Take 1 tablet (20 mg total) by mouth daily., Disp: 30 tablet, Rfl: 1 .  carvedilol (COREG) 12.5 MG tablet, Take 1 tablet (12.5 mg total) by mouth 2 (two) times daily with a meal., Disp: 180 tablet, Rfl: 3 .  Cranberry 400 MG TABS, Take 400 mg by mouth daily., Disp: , Rfl:  .  dapagliflozin propanediol (FARXIGA)  10 MG TABS tablet, Take 1 tablet (10 mg total) by mouth daily before breakfast., Disp: 30 tablet, Rfl: 3 .  estrogen, conjugated,-medroxyprogesterone (PREMPRO) 0.3-1.5 MG tablet, Take 1 tablet by mouth daily., Disp: , Rfl:  .  Krill Oil 1000 MG CAPS, Take 1,000 mg by mouth daily., Disp: , Rfl:  .  liraglutide (VICTOZA) 18 MG/3ML SOPN, Inject 1.8 mg into the skin daily., Disp: 12 mL, Rfl: 1 .  metFORMIN (GLUCOPHAGE-XR) 500 MG 24 hr tablet, TAKE 2 TABLETS BY MOUTH 2 TIMES DAILY, Disp: 120 tablet, Rfl: 1 .  Multiple Vitamin (MULTIVITAMIN) capsule, Take 1 capsule by mouth daily., Disp: , Rfl:  .  nitroGLYCERIN (NITROSTAT) 0.4 MG SL tablet, Place 1 tablet (0.4 mg total) under the tongue every 5 (five) minutes as needed for chest pain., Disp: 90 tablet, Rfl: 3 .  potassium chloride (KLOR-CON) 10 MEQ tablet, TAKE ONE TABLET BY MOUTH DAILY, Disp: 60 tablet, Rfl: 3 .  sacubitril-valsartan (ENTRESTO) 24-26 MG, Take  1 tablet by mouth 2 (two) times daily., Disp: 60 tablet, Rfl: 3 .  Semaglutide, 1 MG/DOSE, (OZEMPIC, 1 MG/DOSE,) 4 MG/3ML SOPN, Inject 1 mg into the skin once a week., Disp: 15 mL, Rfl: 1 .  spironolactone (ALDACTONE) 25 MG tablet, TAKE 1 TABLET BY MOUTH DAILY, Disp: 30 tablet, Rfl: 1 .  torsemide (DEMADEX) 20 MG tablet, TAKE ONE TABLET BY MOUTH DAILY, Disp: 30 tablet, Rfl: 3  No Known Allergies  Objective:   There were no vitals taken for this visit.  Patient is well-developed, well-nourished in no acute distress.  Resting comfortably at home.  Head is normocephalic, atraumatic.  No labored breathing.  Speech is clear and coherent with logical content.  Patient is alert and oriented at baseline.   Assessment and Plan:   1. Suspected COVID-19 virus infection Given abrupt onset and severity of symptoms, concern for Covid.  She did take a home test but concern for false negative getting help soon after symptom onset she took the test.  Encouraged her to be retested with both rapid and PCR  testing.  If positive she would be a candidate for monoclonal antibody infusion as well as other antiviral treatments given her chronic medical history.  Discussed with her that given her medical history I do feel she absolutely needs an in person evaluation with check of oxygenation status and a good examination of heart and lungs as well as evaluation of peripheral edema as she is at higher risk for acute exacerbation of chronic heart failure.  She states she is waiting on a call back from her PCP.  They are unable to accommodate her today she is aware she needs to be seen at least at an urgent care.  ER for any worsening of symptoms.  We will add on Zofran for nausea and Tessalon to help with her cough.  Recommend plain Mucinex to help thin congestion.  Vitamin regimen discussed.  She is to start these.   We will send her for directly to her primary care provider.  Where patient condition and that she has been instructed to be seen in person.   Piedad Climes, [place] 06/24/2020

## 2020-06-24 NOTE — Patient Instructions (Signed)
Instructions sent to patients MyChart.

## 2020-06-24 NOTE — Progress Notes (Signed)
Chief Complaint  Patient presents with  . Cough    Congestion and getting no better     [name] here for URI complaints. Due to COVID-19 pandemic, we are interacting via web portal for an electronic face-to-face visit. I verified patient's ID using 2 identifiers. Patient agreed to proceed with visit via this method. Patient is at home, I am at office. Patient and I are present for visit.    Duration: 3 days  Associated symptoms: sinus congestion, rhinorrhea, sore throat, wheezing, myalgia and cough Denies: sinus pain, itchy watery eyes, ear pain, ear drainage, shortness of breath and fevers, N/V/D Treatment to date: Tessalon Perles, Mucinex, Vit D, Vit C, Zofran, Zinc Sick contacts: Yes - grandson w cold Tested - for covid x2.   Past Medical History:  Diagnosis Date  . Acute pancreatitis 01/06/2014  . Acute systolic congestive heart failure (HCC)   . 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 Adjustment disorder with depressed mood 06/03/2016  . Ankle fracture, right   . Asthma   . Cardiomyopathy (HCC) 11/06/2017   Non-ischemic November 2018  . Depression   . Diabetes mellitus   . Drug overdose   . Essential hypertension 03/05/2017   Overview:  Added automatically from request for surgery [id number] Added automatically from request for surgery [phone]  . Fracture of right orbital floor with routine healing 10/12/2014  . 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 GERD (gastroesophageal reflux disease)   . History of hypertension 03/05/2017  . Injury of right ankle 11/02/2010   Formatting of this note might be different from the original. Last Assessment & Plan:  R ankle distal fibula avulsion fracture - significantly improved.  Add theraband and balance exercises moving forward (demonstrated today).  No evidence of DVT on exam - feel her pain in lateral calf and deep popliteal region is muscular - discussed warmth, redness, swelling - if occurs to seek care.  ASO for th  .  Midline thoracic back pain 03/06/2017  . Mixed hyperlipidemia 08/26/2019  . Myocardial infarction (HCC)   . Nonischemic cardiomyopathy (HCC) 08/26/2019  . NSVT (nonsustained ventricular tachycardia) (HCC) 07/03/2019  . Obesity (BMI 30-39.9) 08/26/2019  . Obstructive sleep apnea 06/26/2017   11/2017 PSG AHI: 5.4, REM AHI: 25, SpO2 nadir: 86% 04/2018 PAP PSG: CPAP 6cm  01/2019 started CPAP therapy  . Other hyperlipidemia 06/06/2018  . Other seasonal allergic rhinitis 03/30/2013   Formatting of this note might be different from the original. Overview:  Watery eyes and sinus pain  . PAT (paroxysmal atrial tachycardia) (HCC) 07/03/2019  . Right ankle injury 11/02/2010  . Schizoaffective disorder (HCC)   . Seasonal allergies 03/30/2013   Overview:  Watery eyes and sinus pain  . Severe obesity (BMI 35.0-39.9) with comorbidity (HCC) 05/27/2019  . Suicidal ideation   . Type 2 diabetes mellitus without complications (HCC) 02/26/2013  . Urinary tract infection 01/07/2014  . UTI (urinary tract infection), bacterial 01/07/2014    Temp 97.6 F (36.4 C) (Oral)  No conversational dyspnea Age appropriate judgment and insight Nml affect and mood  Wheezy bronchitis - Plan: promethazine-dextromethorphan (PROMETHAZINE-DM) 6.25-15 MG/5ML syrup, predniSONE (DELTASONE) 20 MG tablet  5 d pred burst given wheezing, she will monitor sugars at home. Syrup prn, warned about drowsiness.  Continue to push fluids, practice good hand hygiene, cover mouth when coughing. F/u prn. If starting to experience fevers, shaking, or shortness of breath, seek immediate care. Pt voiced understanding and agreement to the plan.  Jilda Roche  Jamiyla Ishee, DO 06/24/20 4:06 PM

## 2020-07-05 ENCOUNTER — Ambulatory Visit (INDEPENDENT_AMBULATORY_CARE_PROVIDER_SITE_OTHER): Payer: BC Managed Care – PPO | Admitting: Cardiology

## 2020-07-05 ENCOUNTER — Encounter: Payer: Self-pay | Admitting: Cardiology

## 2020-07-05 ENCOUNTER — Other Ambulatory Visit: Payer: Self-pay

## 2020-07-05 VITALS — BP 110/66 | HR 82 | Ht 62.0 in | Wt 192.0 lb

## 2020-07-05 DIAGNOSIS — I4729 Other ventricular tachycardia: Secondary | ICD-10-CM

## 2020-07-05 DIAGNOSIS — I493 Ventricular premature depolarization: Secondary | ICD-10-CM | POA: Diagnosis not present

## 2020-07-05 DIAGNOSIS — I428 Other cardiomyopathies: Secondary | ICD-10-CM

## 2020-07-05 DIAGNOSIS — I10 Essential (primary) hypertension: Secondary | ICD-10-CM

## 2020-07-05 DIAGNOSIS — I471 Supraventricular tachycardia: Secondary | ICD-10-CM

## 2020-07-05 DIAGNOSIS — I472 Ventricular tachycardia: Secondary | ICD-10-CM

## 2020-07-05 DIAGNOSIS — I5020 Unspecified systolic (congestive) heart failure: Secondary | ICD-10-CM

## 2020-07-05 NOTE — Progress Notes (Signed)
Cardiology Office Note:    Date:  07/05/2020   ID:  Karen Patrick, DOB [birth year], MRN [id number]  PCP:  Sharlene Dory, DO  Cardiologist:  Thomasene Ripple, DO  Electrophysiologist:  None   Referring MD: Sharlene Dory*   I am doing fine  History of Present Illness:    Karen Patrick is a 55 y.o. female with a hx of  diabetes mellitus, hypertension, hyperlipidemia, family history of premature coronary disease (father first MI in his 22s and mother died of MI 54),history of nonischemic cardiomyopathy with a EF of 35 to 40%2018and a left heart catheterization which was also normal. Most recent echocardiogram at the outside facility showed EF greater than 50%.  I saw the patient April 2021 at that time we had discussed her monitor results which showed frequent PVCs as well as 2 runs of nonsustained ventricular tachycardia. Started on Cardizem and recommend she undergo cardiac MRI. Unfortunately patient was unable to perform the study due to claustrophobia.  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 She did see EP. However in the interim prior to see EP she had transition from Cardizem to Coreg due to the fact that she was convincing worsening palpitations. Her palpitations had resolved therefore medical management was recommended to be continued.  I saw the patient on February 03, 2020 at that time she reported that she had been experiencing shortness of breath on exertion as well as intermittent chest pain. She described the pain as a squeezing sensation.  She did note that the pain she was not able to walk close to a mile which she had on before.    Past Medical History:  Diagnosis Date  . Acute pancreatitis 01/06/2014  . Acute systolic congestive heart failure (HCC)   . 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  Adjustment disorder with depressed mood 06/03/2016  . Ankle fracture, right   . Asthma   . Cardiomyopathy (HCC) 11/06/2017   Non-ischemic November 2018  . Depression   . Diabetes mellitus   . Drug overdose   . Essential hypertension 03/05/2017   Overview:  Added automatically from request for surgery [id number] Added automatically from request for surgery [phone]  . Fracture of right orbital floor with routine healing 10/12/2014  . 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 GERD (gastroesophageal reflux disease)   . History of hypertension 03/05/2017  . Injury of right ankle 11/02/2010   Formatting of this note might be different from the original. Last Assessment & Plan:  R ankle distal fibula avulsion fracture - significantly improved.  Add theraband and balance exercises moving forward (demonstrated today).  No evidence of DVT on exam - feel her pain in lateral calf and deep popliteal region is muscular - discussed warmth, redness, swelling - if occurs to seek care.  ASO for th  . Midline thoracic back pain 03/06/2017  . Mixed hyperlipidemia 08/26/2019  . Myocardial infarction (HCC)   . Nonischemic cardiomyopathy (HCC) 08/26/2019  . NSVT (nonsustained ventricular tachycardia) (HCC) 07/03/2019  . Obesity (BMI 30-39.9) 08/26/2019  . Obstructive sleep apnea 06/26/2017   11/2017 PSG AHI: 5.4, REM AHI: 25, SpO2 nadir: 86% 04/2018 PAP PSG: CPAP 6cm  01/2019 started CPAP therapy  . Other hyperlipidemia 06/06/2018  . Other seasonal allergic rhinitis 03/30/2013   Formatting of this note might be different from the original. Overview:  Watery eyes and sinus pain  .  PAT (paroxysmal atrial tachycardia) (HCC) 07/03/2019  . Right ankle injury 11/02/2010  . Schizoaffective disorder (HCC)   . Seasonal allergies 03/30/2013   Overview:  Watery eyes and sinus pain  . Severe obesity (BMI 35.0-39.9) with comorbidity (HCC) 05/27/2019  . Suicidal ideation   . Type 2 diabetes mellitus without complications (HCC) 02/26/2013  . Urinary tract infection  01/07/2014  . UTI (urinary tract infection), bacterial 01/07/2014    Past Surgical History:  Procedure Laterality Date  . BACK SURGERY    . CESAREAN SECTION    . ENDOMETRIAL ABLATION W/ NOVASURE    . FOOT FASCIOTOMY    . HAMMER TOE SURGERY    . KNEE ARTHROCENTESIS    . LIPOMA EXCISION      Current Medications: Current Meds  Medication Sig  . ascorbic acid (VITAMIN C) 500 MG tablet Take 500 mg by mouth daily.  Marland Kitchen atomoxetine (STRATTERA) 40 MG capsule Take 1 capsule (40 mg total) by mouth every morning.  Marland Kitchen atorvastatin (LIPITOR) 20 MG tablet Take 1 tablet (20 mg total) by mouth daily.  . benzonatate (TESSALON) 100 MG capsule Take 1 capsule (100 mg total) by mouth 3 (three) times daily as needed for cough.  . carvedilol (COREG) 12.5 MG tablet Take 1 tablet (12.5 mg total) by mouth 2 (two) times daily with a meal.  . Cranberry 400 MG TABS Take 400 mg by mouth daily.  . dapagliflozin propanediol (FARXIGA) 10 MG TABS tablet Take 1 tablet (10 mg total) by mouth daily before breakfast.  . estrogen, conjugated,-medroxyprogesterone (PREMPRO) 0.3-1.5 MG tablet Take 1 tablet by mouth daily.  Boris Lown Oil 1000 MG CAPS Take 1,000 mg by mouth daily.  Marland Kitchen liraglutide (VICTOZA) 18 MG/3ML SOPN Inject 1.8 mg into the skin daily.  . metFORMIN (GLUCOPHAGE-XR) 500 MG 24 hr tablet TAKE 2 TABLETS BY MOUTH 2 TIMES DAILY  . Multiple Vitamin (MULTIVITAMIN) capsule Take 1 capsule by mouth daily.  . ondansetron (ZOFRAN ODT) 4 MG disintegrating tablet Take 1 tablet (4 mg total) by mouth every 8 (eight) hours as needed for nausea or vomiting.  . promethazine-dextromethorphan (PROMETHAZINE-DM) 6.25-15 MG/5ML syrup Take 5 mLs by mouth 4 (four) times daily as needed for cough.  . sacubitril-valsartan (ENTRESTO) 24-26 MG Take 1 tablet by mouth 2 (two) times daily.  . Semaglutide, 1 MG/DOSE, (OZEMPIC, 1 MG/DOSE,) 4 MG/3ML SOPN Inject 1 mg into the skin once a week.  . spironolactone (ALDACTONE) 25 MG tablet TAKE 1 TABLET BY  MOUTH DAILY  . torsemide (DEMADEX) 20 MG tablet TAKE ONE TABLET BY MOUTH DAILY     Allergies:   Patient has no known allergies.   Social History   Socioeconomic History  . Marital status: Married    Spouse name: Not on file  . Number of children: Not on file  . Years of education: Not on file  . Highest education level: Not on file  Occupational History  . Not on file  Tobacco Use  . Smoking status: Former Smoker    Types: Cigarettes    Start date: 1983    Quit date: 2006    Years since quitting: 16.1  . Smokeless tobacco: Never Used  Substance and Sexual Activity  . Alcohol use: No  . Drug use: No  . Sexual activity: Yes    Birth control/protection: Surgical  Other Topics Concern  . Not on file  Social History Narrative  . Not on file   Social Determinants of Health   Financial Resource Strain:  Not on file  Food Insecurity: Not on file  Transportation Needs: Not on file  Physical Activity: Not on file  Stress: Not on file  Social Connections: Not on file     Family History: The patient's family history includes Cancer in her mother and paternal aunt; Diabetes in her father, maternal grandmother, mother, and paternal grandmother; Heart attack in her father and maternal grandmother; Heart failure in her mother; Hyperlipidemia in her father; Hypertension in her father, maternal grandmother, and mother; Stroke in her father and mother; Sudden death in her father and paternal grandmother.  ROS:   Review of Systems  Constitution: Negative for decreased appetite, fever and weight gain.  HENT: Negative for congestion, ear discharge, hoarse voice and sore throat.   Eyes: Negative for discharge, redness, vision loss in right eye and visual halos.  Cardiovascular: Negative for chest pain, dyspnea on exertion, leg swelling, orthopnea and palpitations.  Respiratory: Negative for cough, hemoptysis, shortness of breath and snoring.   Endocrine: Negative for heat intolerance and  polyphagia.  Hematologic/Lymphatic: Negative for bleeding problem. Does not bruise/bleed easily.  Skin: Negative for flushing, nail changes, rash and suspicious lesions.  Musculoskeletal: Negative for arthritis, joint pain, muscle cramps, myalgias, neck pain and stiffness.  Gastrointestinal: Negative for abdominal pain, bowel incontinence, diarrhea and excessive appetite.  Genitourinary: Negative for decreased libido, genital sores and incomplete emptying.  Neurological: Negative for brief paralysis, focal weakness, headaches and loss of balance.  Psychiatric/Behavioral: Negative for altered mental status, depression and suicidal ideas.  Allergic/Immunologic: Negative for HIV exposure and persistent infections.    EKGs/Labs/Other Studies Reviewed:    The following studies were reviewed today:   EKG:  None today  Transthoracic echocardiogram IMPRESSIONS 02/10/2020 1. Left ventricular ejection fraction, by estimation, is 40 to 45%. The  left ventricle has mildly decreased function. Left ventricular endocardial  border not optimally defined to evaluate regional wall motion. Left  ventricular diastolic parameters are  consistent with Grade I diastolic dysfunction (impaired relaxation).  2. Right ventricular systolic function is normal. The right ventricular  size is normal.  3. The mitral valve is normal in structure. No evidence of mitral valve  regurgitation. No evidence of mitral stenosis.  4. The aortic valve is normal in structure. Aortic valve regurgitation is  not visualized. No aortic stenosis is present.  5. The inferior vena cava is normal in size with greater than 50%  respiratory variability, suggesting right atrial pressure of 3 mmHg.   Coronary CTA IMPRESSION: 1. Coronary calcium score of 0. This was 0 percentile for age and sex matched control.  2. Normal coronary origin with right dominance.  3. No evidence of CAD.   The patient wore the monitor for 14  days starting 05/28/2019  . Indication:  Palpitations  The minimum heart rate was 67 bpm, maximum heart rate was 184  bpm, and average heart rate was 88 bpm. Predominant underlying rhythm was Sinus Rhythm.  2 Ventricular Tachycardia runs occurred, the run with the fastest interval lasting 8 beats with a maximum heart rate of 184 bpm, the longest lasting 5 beats with an average rate of 108 bpm.    1 run of Supraventricular Tachycardia occurred lasting 6 beats with a maximum heart rate of 124 bpm (average 109 bpm).  Premature atrial complexes were rare (<1%). Premature Ventricular complexes were frequent (6.8%, [id number]). Ventricular Bigeminy and Trigeminy were present.  12 patient triggered events noted with 9 associated with ventricular ectopy with the remaining associated with sinus  rhythm. 8 diary event noted 7 associated with ventricular ectopy and there remaining associated with sinus rhythm.   No pauses, No AV block and no atrial fibrillation present.  Conclusion: This study is remarkable for the following:                             1. 2 runs of nonsustained ventricular tachycardia.                             2. Rare asymptomatic paroxysmal atrial tachycardia with variable block.                             3. Frequent Symptomatic premature ventricular complexes.   Recent Labs: 07/23/2019: Hemoglobin 13.4; Platelets 218 02/05/2020: ALT 14 02/29/2020: BUN 14; Creatinine, Ser 0.73; Magnesium 1.9; Potassium 3.9; Sodium 138  Recent Lipid Panel    Component Value Date/Time   CHOL 123 02/05/2020 1039   TRIG 61 02/05/2020 1039   HDL 57 02/05/2020 1039   CHOLHDL 2.2 02/05/2020 1039   LDLCALC 52 02/05/2020 1039    Physical Exam:    VS:  BP 110/66   Pulse 82   Ht 5\' 2"  (1.575 m)   Wt 192 lb (87.1 kg)   SpO2 99%   BMI 35.12 kg/m     Wt Readings from Last 3 Encounters:  07/05/20 192 lb (87.1 kg)  06/13/20 194 lb (88 kg)  05/10/20 200 lb (90.7 kg)     GEN: Well  nourished, well developed in no acute distress HEENT: Normal NECK: No JVD; No carotid bruits LYMPHATICS: No lymphadenopathy CARDIAC: S1S2 noted,RRR, no murmurs, rubs, gallops RESPIRATORY:  Clear to auscultation without rales, wheezing or rhonchi  ABDOMEN: Soft, non-tender, non-distended, +bowel sounds, no guarding. EXTREMITIES: No edema, No cyanosis, no clubbing MUSCULOSKELETAL:  No deformity  SKIN: Warm and dry NEUROLOGIC:  Alert and oriented x 3, non-focal PSYCHIATRIC:  Normal affect, good insight  ASSESSMENT:    1. Nonischemic cardiomyopathy (HCC)   2. Essential hypertension   3. PAT (paroxysmal atrial tachycardia) (HCC)   4. Frequent PVCs   5. NSVT (nonsustained ventricular tachycardia) (HCC)   6. HFrEF (heart failure with reduced ejection fraction) (HCC)    PLAN:    She appears to be doing well from a cardiovascular standpoint.  She is tolerating her Sherryll Burgerntresto she is on 24-26 mg twice daily we will keep her on this for now.  She also is on Aldactone 25 mg daily, carvedilol 12.5 mg daily.  She will continue this for the next 6 months.  We will repeat her echocardiogram prior to her next visit.  The patient understands the need to lose weight with diet and exercise. We have discussed specific strategies for this.  She also [place] plans [place]  We will be happy to sign a record release once we get the information.  No palpitations continue patient on her beta-blocker for now  The patient is in agreement with the above plan. The patient left the office in stable condition.  The patient will follow up in the sooner if needed.  Medication Adjustments/Labs and Tests Ordered: Current medicines are reviewed at length with the patient today.  Concerns regarding medicines are outlined above.  No orders of the defined types were placed in this encounter.  No orders of the defined types were placed in this encounter.   Patient  Instructions   Medication Instructions:  Your physician recommends that you continue on your current medications as directed. Please refer to the Current Medication list given to you today.  *If you need a refill on your cardiac medications before your next appointment, please call your pharmacy*   Lab Work: [facility] If you have labs (blood work) drawn today and your tests are completely normal, you will receive your results only by: Marland Kitchen MyChart Message (if you have MyChart) OR . A paper copy in the mail If you have any lab test that is abnormal or we need to change your treatment, we will call you to review the results.   Testing/Procedures: Your physician has requested that you have an echocardiogram. Echocardiography is a painless test that uses sound waves to create images of your heart. It provides your doctor with information about the size and shape of your heart and how well your heart's chambers and valves are working. This procedure takes approximately one hour. There are no restrictions for this procedure.   Follow-Up: At [facility], you and your health needs are our priority.  As part of our continuing mission to provide you with exceptional heart care, we have created designated Provider Care Teams.  These Care Teams include your primary Cardiologist (physician) and Advanced Practice Providers (APPs -  Physician Assistants and Nurse Practitioners) who all work together to provide you with the care you need, when you need it.  We recommend signing up for the patient portal called "MyChart".  Sign up information is provided on this After Visit Summary.  MyChart is used to connect with patients for Virtual Visits (Telemedicine).  Patients are able to view lab/test results, encounter notes, upcoming appointments, etc.  Non-urgent messages can be sent to your provider as well.   To learn more about what you can do with MyChart, go to [email or web address].    Your next appointment:    6 month(s)  The format for your next appointment:   In Person  Provider:   Thomasene Ripple, DO   Other Instructions  Echocardiogram An echocardiogram is a test that uses sound waves (ultrasound) to produce images of the heart. Images from an echocardiogram can provide important information about:  Heart size and shape.  The size and thickness and movement of your heart's walls.  Heart muscle function and strength.  Heart valve function or if you have stenosis. Stenosis is when the heart valves are too narrow.  If blood is flowing backward through the heart valves (regurgitation).  A tumor or infectious growth around the heart valves.  Areas of heart muscle that are not working well because of poor blood flow or injury from a heart attack.  Aneurysm detection. An aneurysm is a weak or damaged part of an artery wall. The wall bulges out from the normal force of blood pumping through the body. Tell a health care provider about:  Any allergies you have.  All medicines you are taking, including vitamins, herbs, eye drops, creams, and over-the-counter medicines.  Any blood disorders you have.  Any surgeries you have had.  Any medical conditions you have.  Whether you are pregnant or may be pregnant. What are the risks? Generally, this is a safe test. However, problems may occur, including an allergic reaction to dye (contrast) that may be used during the test. What happens before the test? No specific preparation is needed. You may  eat and drink normally. What happens during the test?  You will take off your clothes from the waist up and put on a hospital gown.  Electrodes or electrocardiogram (ECG)patches may be placed on your chest. The electrodes or patches are then connected to a device that monitors your heart rate and rhythm.  You will lie down on a table for an ultrasound exam. A gel will be applied to your chest to help sound waves pass through your skin.  A  handheld device, called a transducer, will be pressed against your chest and moved over your heart. The transducer produces sound waves that travel to your heart and bounce back (or "echo" back) to the transducer. These sound waves will be captured in real-time and changed into images of your heart that can be viewed on a video monitor. The images will be recorded on a computer and reviewed by your health care provider.  You may be asked to change positions or hold your breath for a short time. This makes it easier to get different views or better views of your heart.  In some cases, you may receive contrast through an IV in one of your veins. This can improve the quality of the pictures from your heart. The procedure may vary among health care providers and hospitals.   What can I expect after the test? You may return to your normal, everyday life, including diet, activities, and medicines, unless your health care provider tells you not to do that. Follow these instructions at home:  It is up to you to get the results of your test. Ask your health care provider, or the department that is doing the test, when your results will be ready.  Keep all follow-up visits. This is important. Summary  An echocardiogram is a test that uses sound waves (ultrasound) to produce images of the heart.  Images from an echocardiogram can provide important information about the size and shape of your heart, heart muscle function, heart valve function, and other possible heart problems.  You do not need to do anything to prepare before this test. You may eat and drink normally.  After the echocardiogram is completed, you may return to your normal, everyday life, unless your health care provider tells you not to do that. This information is not intended to replace advice given to you by your health care provider. Make sure you discuss any questions you have with your health care provider. Document Revised:  12/08/2019 Document Reviewed: 12/08/2019 Elsevier Patient Education  2021 Elsevier Inc.      Adopting a Healthy Lifestyle.  Know what a healthy weight is for you (roughly BMI <25) and aim to maintain this   Aim for 7+ servings of fruits and vegetables daily   65-80+ fluid ounces of water or unsweet tea for healthy kidneys   Limit to max 1 drink of alcohol per day; avoid smoking/tobacco   Limit animal fats in diet for cholesterol and heart health - choose grass fed whenever available   Avoid highly processed foods, and foods high in saturated/trans fats   Aim for low stress - take time to unwind and care for your mental health   Aim for 150 min of moderate intensity exercise weekly for heart health, and weights twice weekly for bone health   Aim for 7-9 hours of sleep daily   When it comes to diets, agreement about the perfect plan isnt easy to find, even among the experts. Experts at the [facility]  [employer] developed an idea known as the Healthy Eating Plate. Just imagine a plate divided into logical, healthy portions.   The emphasis is on diet quality:   Load up on vegetables and fruits - one-half of your plate: Aim for color and variety, and remember that potatoes dont count.   Go for whole grains - one-quarter of your plate: Whole wheat, barley, wheat berries, quinoa, oats, brown rice, and foods made with them. If you want pasta, go with whole wheat pasta.   Protein power - one-quarter of your plate: Fish, chicken, beans, and nuts are all healthy, versatile protein sources. Limit red meat.   The diet, however, does go beyond the plate, offering a few other suggestions.   Use healthy plant oils, such as olive, canola, soy, corn, sunflower and peanut. Check the labels, and avoid partially hydrogenated oil, which have unhealthy trans fats.   If youre thirsty, drink water. Coffee and tea are good in moderation, but skip sugary drinks and limit milk and dairy  products to one or two daily servings.   The type of carbohydrate in the diet is more important than the amount. Some sources of carbohydrates, such as vegetables, fruits, whole grains, and beans-are healthier than others.   Finally, stay active  Signed, Thomasene Ripple, DO  07/05/2020 8:55 AM    [facility]

## 2020-07-05 NOTE — Patient Instructions (Addendum)
Medication Instructions:  Your physician recommends that you continue on your current medications as directed. Please refer to the Current Medication list given to you today.  *If you need a refill on your cardiac medications before your next appointment, please call your pharmacy*   Lab Work: None If you have labs (blood work) drawn today and your tests are completely normal, you will receive your results only by: . MyChart Message (if you have MyChart) OR . A paper copy in the mail If you have any lab test that is abnormal or we need to change your treatment, we will call you to review the results.   Testing/Procedures: Your physician has requested that you have an echocardiogram. Echocardiography is a painless test that uses sound waves to create images of your heart. It provides your doctor with information about the size and shape of your heart and how well your heart's chambers and valves are working. This procedure takes approximately one hour. There are no restrictions for this procedure.   Follow-Up: At [facility], you and your health needs are our priority.  As part of our continuing mission to provide you with exceptional heart care, we have created designated Provider Care Teams.  These Care Teams include your primary Cardiologist (physician) and Advanced Practice Providers (APPs -  Physician Assistants and Nurse Practitioners) who all work together to provide you with the care you need, when you need it.  We recommend signing up for the patient portal called "MyChart".  Sign up information is provided on this After Visit Summary.  MyChart is used to connect with patients for Virtual Visits (Telemedicine).  Patients are able to view lab/test results, encounter notes, upcoming appointments, etc.  Non-urgent messages can be sent to your provider as well.   To learn more about what you can do with MyChart, go to [email or web address].    Your next appointment:   6  month(s)  The format for your next appointment:   In Person  Provider:   Kardie Tobb, DO   Other Instructions   Echocardiogram An echocardiogram is a test that uses sound waves (ultrasound) to produce images of the heart. Images from an echocardiogram can provide important information about:  Heart size and shape.  The size and thickness and movement of your heart's walls.  Heart muscle function and strength.  Heart valve function or if you have stenosis. Stenosis is when the heart valves are too narrow.  If blood is flowing backward through the heart valves (regurgitation).  A tumor or infectious growth around the heart valves.  Areas of heart muscle that are not working well because of poor blood flow or injury from a heart attack.  Aneurysm detection. An aneurysm is a weak or damaged part of an artery wall. The wall bulges out from the normal force of blood pumping through the body. Tell a health care provider about:  Any allergies you have.  All medicines you are taking, including vitamins, herbs, eye drops, creams, and over-the-counter medicines.  Any blood disorders you have.  Any surgeries you have had.  Any medical conditions you have.  Whether you are pregnant or may be pregnant. What are the risks? Generally, this is a safe test. However, problems may occur, including an allergic reaction to dye (contrast) that may be used during the test. What happens before the test? No specific preparation is needed. You may eat and drink normally. What happens during the test?  You will take off your clothes from   the waist up and put on a hospital gown.  Electrodes or electrocardiogram (ECG)patches may be placed on your chest. The electrodes or patches are then connected to a device that monitors your heart rate and rhythm.  You will lie down on a table for an ultrasound exam. A gel will be applied to your chest to help sound waves pass through your skin.  A  handheld device, called a transducer, will be pressed against your chest and moved over your heart. The transducer produces sound waves that travel to your heart and bounce back (or "echo" back) to the transducer. These sound waves will be captured in real-time and changed into images of your heart that can be viewed on a video monitor. The images will be recorded on a computer and reviewed by your health care provider.  You may be asked to change positions or hold your breath for a short time. This makes it easier to get different views or better views of your heart.  In some cases, you may receive contrast through an IV in one of your veins. This can improve the quality of the pictures from your heart. The procedure may vary among health care providers and hospitals.   What can I expect after the test? You may return to your normal, everyday life, including diet, activities, and medicines, unless your health care provider tells you not to do that. Follow these instructions at home:  It is up to you to get the results of your test. Ask your health care provider, or the department that is doing the test, when your results will be ready.  Keep all follow-up visits. This is important. Summary  An echocardiogram is a test that uses sound waves (ultrasound) to produce images of the heart.  Images from an echocardiogram can provide important information about the size and shape of your heart, heart muscle function, heart valve function, and other possible heart problems.  You do not need to do anything to prepare before this test. You may eat and drink normally.  After the echocardiogram is completed, you may return to your normal, everyday life, unless your health care provider tells you not to do that. This information is not intended to replace advice given to you by your health care provider. Make sure you discuss any questions you have with your health care provider. Document Revised:  12/08/2019 Document Reviewed: 12/08/2019 Elsevier Patient Education  2021 [employer]vier Inc.   

## 2020-07-24 ENCOUNTER — Other Ambulatory Visit: Payer: Self-pay | Admitting: Family Medicine

## 2020-08-16 ENCOUNTER — Ambulatory Visit: Payer: BC Managed Care – PPO | Admitting: Family Medicine

## 2020-08-24 ENCOUNTER — Ambulatory Visit: Payer: BC Managed Care – PPO | Admitting: Family Medicine

## 2020-08-24 ENCOUNTER — Encounter: Payer: Self-pay | Admitting: Family Medicine

## 2020-08-24 ENCOUNTER — Other Ambulatory Visit: Payer: Self-pay

## 2020-08-24 VITALS — BP 114/68 | HR 78 | Temp 98.4°F | Ht 62.5 in | Wt 183.2 lb

## 2020-08-24 DIAGNOSIS — F259 Schizoaffective disorder, unspecified: Secondary | ICD-10-CM

## 2020-08-24 DIAGNOSIS — Z794 Long term (current) use of insulin: Secondary | ICD-10-CM

## 2020-08-24 DIAGNOSIS — E1165 Type 2 diabetes mellitus with hyperglycemia: Secondary | ICD-10-CM

## 2020-08-24 DIAGNOSIS — I10 Essential (primary) hypertension: Secondary | ICD-10-CM | POA: Diagnosis not present

## 2020-08-24 LAB — COMPREHENSIVE METABOLIC PANEL
ALT: 14 U/L (ref 0–35)
AST: 13 U/L (ref 0–37)
Albumin: 4.2 g/dL (ref 3.5–5.2)
Alkaline Phosphatase: 62 U/L (ref 39–117)
BUN: 11 mg/dL (ref 6–23)
CO2: 27 mEq/L (ref 19–32)
Calcium: 9.3 mg/dL (ref 8.4–10.5)
Chloride: 101 mEq/L (ref 96–112)
Creatinine, Ser: 0.68 mg/dL (ref 0.40–1.20)
GFR: 98.12 mL/min (ref >60.00)
Glucose, Bld: 117 mg/dL — ABNORMAL HIGH (ref 70–99)
Potassium: 3.9 mEq/L (ref 3.5–5.1)
Sodium: 136 mEq/L (ref 135–145)
Total Bilirubin: 0.7 mg/dL (ref 0.2–1.2)
Total Protein: 6.9 g/dL (ref 6.0–8.3)

## 2020-08-24 LAB — LIPID PANEL
Cholesterol: 142 mg/dL (ref 0–200)
HDL: 53.2 mg/dL (ref >39.00)
LDL Cholesterol: 73 mg/dL (ref 0–99)
NonHDL: 88.6
Total CHOL/HDL Ratio: 3
Triglycerides: 78 mg/dL (ref 0.0–149.0)
VLDL: 15.6 mg/dL (ref 0.0–40.0)

## 2020-08-24 LAB — HEMOGLOBIN A1C: Hgb A1c MFr Bld: 6.9 % — ABNORMAL HIGH (ref 4.6–6.5)

## 2020-08-24 LAB — MICROALBUMIN / CREATININE URINE RATIO
Creatinine,U: 77.8 mg/dL
Microalb Creat Ratio: 0.9 mg/g (ref 0.0–30.0)
Microalb, Ur: <0.7 mg/dL (ref 0.0–1.9)

## 2020-08-24 MED ORDER — ATORVASTATIN CALCIUM 20 MG PO TABS: 20.0000 mg | ORAL_TABLET | Freq: Every day | ORAL | 11 refills | Status: DC

## 2020-08-24 NOTE — Progress Notes (Signed)
Subjective:   Chief Complaint  Patient presents with  . Follow-up    Karen Patrick is a 55 y.o. female here for follow-up of diabetes.   Karen Patrick's self monitored glucose range is mid-low 100's.  Patient denies hypoglycemic reactions. She checks her glucose levels 2 time(s) per day. Patient does not require insulin.   Medications include: Metformin XR 1000 mg bid, Farxiga 10 mg/d, Victoza 1.8 mg/d Diet is healthy.  Exercise: HIIT, cardio No cp or SOB.   HTN She does not monitor home blood pressures. She is compliant with medications-spironolactone 25 mg/d, Entresto 24-26 mg bid, Coreg 12.5 mg bid. Patient has these side effects of medication: none Diet/exercise as above.   Past Medical History:  Diagnosis Date  . Acute pancreatitis 01/06/2014  . Acute systolic congestive heart failure (HCC)   . 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 Adjustment disorder with depressed mood 06/03/2016  . Ankle fracture, right   . Asthma   . Cardiomyopathy (HCC) 11/06/2017   Non-ischemic November 2018  . Depression   . Diabetes mellitus   . Drug overdose   . Essential hypertension 03/05/2017   Overview:  Added automatically from request for surgery [id number] Added automatically from request for surgery [phone]  . Fracture of right orbital floor with routine healing 10/12/2014  . 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 GERD (gastroesophageal reflux disease)   . History of hypertension 03/05/2017  . Injury of right ankle 11/02/2010   Formatting of this note might be different from the original. Last Assessment & Plan:  R ankle distal fibula avulsion fracture - significantly improved.  Add theraband and balance exercises moving forward (demonstrated today).  No evidence of DVT on exam - feel her pain in lateral calf and deep popliteal region is muscular - discussed warmth, redness, swelling - if occurs to seek care.  ASO for th  . Midline thoracic back pain 03/06/2017  . Mixed  hyperlipidemia 08/26/2019  . Myocardial infarction (HCC)   . Nonischemic cardiomyopathy (HCC) 08/26/2019  . NSVT (nonsustained ventricular tachycardia) (HCC) 07/03/2019  . Obesity (BMI 30-39.9) 08/26/2019  . Obstructive sleep apnea 06/26/2017   11/2017 PSG AHI: 5.4, REM AHI: 25, SpO2 nadir: 86% 04/2018 PAP PSG: CPAP 6cm  01/2019 started CPAP therapy  . Other hyperlipidemia 06/06/2018  . Other seasonal allergic rhinitis 03/30/2013   Formatting of this note might be different from the original. Overview:  Watery eyes and sinus pain  . PAT (paroxysmal atrial tachycardia) (HCC) 07/03/2019  . Right ankle injury 11/02/2010  . Schizoaffective disorder (HCC)   . Seasonal allergies 03/30/2013   Overview:  Watery eyes and sinus pain  . Severe obesity (BMI 35.0-39.9) with comorbidity (HCC) 05/27/2019  . Suicidal ideation   . Type 2 diabetes mellitus without complications (HCC) 02/26/2013  . Urinary tract infection 01/07/2014  . UTI (urinary tract infection), bacterial 01/07/2014     Related testing: Retinal exam: Done Pneumovax: done  Objective:  BP 114/68 (BP Location: Left Arm, Patient Position: Sitting, Cuff Size: Normal)   Pulse 78   Temp 98.4 F (36.9 C) (Oral)   Ht 5' 2.5" (1.588 m)   Wt 183 lb 4 oz (83.1 kg)   SpO2 98%   BMI 32.98 kg/m  General:  Well developed, well nourished, in no apparent distress Skin:  Warm, no pallor or diaphoresis Head:  Normocephalic, atraumatic Eyes:  Pupils equal and round, sclera anicteric without injection  Lungs:  CTAB, no access msc use Cardio:  RRR, no  bruits, no LE edema Musculoskeletal:  Symmetrical muscle groups noted without atrophy or deformity Neuro:  Sensation intact to pinprick on feet Psych: Age appropriate judgment and insight  Assessment:   Type 2 diabetes mellitus with hyperglycemia, with long-term current use of insulin (HCC) - Plan: Hemoglobin A1c, Lipid panel, Comprehensive metabolic panel, Microalbumin / creatinine urine ratio, CANCELED: HIV  Antibody (routine testing w rflx)  Schizoaffective disorder, unspecified type (HCC), Chronic  Essential hypertension   Plan:   1. Cont Metformin XR 1000 mg bid, Farxiga 10 mg/d, Victoza 1.8 mg/d. Counseled on diet and exercise. Goal A1c is <7. 2. She is on Entresto. Cont Spironolactone 25 mg/d, Coreg 12.5 mg bid.  F/u in 3-6 mo pending above. The patient voiced understanding and agreement to the plan.  Jilda Roche Edwards, DO 08/24/20 9:06 AM

## 2020-08-24 NOTE — Patient Instructions (Signed)
Give [place] 2-3 business days to get the results of your labs back. Our follow up will be based on your results.  Strong work with your weight loss!  Keep the diet clean and stay active.  Let us know if you need anything.

## 2020-09-16 ENCOUNTER — Ambulatory Visit (HOSPITAL_BASED_OUTPATIENT_CLINIC_OR_DEPARTMENT_OTHER)
Admission: RE | Admit: 2020-09-16 | Discharge: 2020-09-16 | Disposition: A | Discharge status: Home / Self Care | Payer: BC Managed Care – PPO | Source: Ambulatory Visit | Attending: Cardiology | Admitting: Cardiology

## 2020-09-16 ENCOUNTER — Other Ambulatory Visit: Payer: Self-pay

## 2020-09-16 DIAGNOSIS — I5020 Unspecified systolic (congestive) heart failure: Secondary | ICD-10-CM | POA: Insufficient documentation

## 2020-09-16 DIAGNOSIS — I428 Other cardiomyopathies: Secondary | ICD-10-CM

## 2020-09-17 LAB — ECHOCARDIOGRAM COMPLETE
AR max vel: 2.42 cm2
AV Area VTI: 2.48 cm2
AV Area mean vel: 2.37 cm2
AV Mean grad: 2 mmHg
AV Peak grad: 4.3 mmHg
Ao pk vel: 1.04 m/s
Area-P 1/2: 3.48 cm2
Calc EF: 49.5 %
S' Lateral: 3.61 cm
Single Plane A2C EF: 44.6 %
Single Plane A4C EF: 51.8 %

## 2020-09-20 ENCOUNTER — Telehealth: Payer: Self-pay | Admitting: Family Medicine

## 2020-09-20 NOTE — Telephone Encounter (Signed)
Received [employer] form for [employer]. Have completed and faxed to [employer] at [phone]. Received fax confirmation Order will be shipped to our office 7 to 10 business days. Will notify the patient once order is received.

## 2020-09-23 ENCOUNTER — Telehealth: Payer: Self-pay

## 2020-09-23 ENCOUNTER — Other Ambulatory Visit (HOSPITAL_BASED_OUTPATIENT_CLINIC_OR_DEPARTMENT_OTHER): Payer: BC Managed Care – PPO

## 2020-09-23 NOTE — Telephone Encounter (Signed)
Left message for patient to return our call.

## 2020-09-25 ENCOUNTER — Other Ambulatory Visit: Payer: Self-pay | Admitting: Family Medicine

## 2020-10-03 ENCOUNTER — Encounter: Payer: Self-pay | Admitting: Family Medicine

## 2020-10-06 ENCOUNTER — Other Ambulatory Visit: Payer: Self-pay | Admitting: Family Medicine

## 2020-10-12 NOTE — Telephone Encounter (Signed)
Received today 10/12/2020 the order from [employer] #[id number]. Called the patient to inform to pickup at her convenience Placed items in the refrigerator.

## 2020-10-18 ENCOUNTER — Emergency Department (HOSPITAL_BASED_OUTPATIENT_CLINIC_OR_DEPARTMENT_OTHER): Payer: BC Managed Care – PPO

## 2020-10-18 ENCOUNTER — Emergency Department (HOSPITAL_BASED_OUTPATIENT_CLINIC_OR_DEPARTMENT_OTHER)
Admission: EM | Admit: 2020-10-18 | Discharge: 2020-10-18 | Disposition: A | Discharge status: Home / Self Care | Payer: BC Managed Care – PPO | Attending: Emergency Medicine | Admitting: Emergency Medicine

## 2020-10-18 ENCOUNTER — Other Ambulatory Visit: Payer: Self-pay

## 2020-10-18 ENCOUNTER — Encounter (HOSPITAL_BASED_OUTPATIENT_CLINIC_OR_DEPARTMENT_OTHER): Payer: Self-pay

## 2020-10-18 DIAGNOSIS — J45909 Unspecified asthma, uncomplicated: Secondary | ICD-10-CM | POA: Insufficient documentation

## 2020-10-18 DIAGNOSIS — E119 Type 2 diabetes mellitus without complications: Secondary | ICD-10-CM | POA: Diagnosis not present

## 2020-10-18 DIAGNOSIS — U071 COVID-19: Secondary | ICD-10-CM | POA: Insufficient documentation

## 2020-10-18 DIAGNOSIS — Z7984 Long term (current) use of oral hypoglycemic drugs: Secondary | ICD-10-CM | POA: Diagnosis not present

## 2020-10-18 DIAGNOSIS — I110 Hypertensive heart disease with heart failure: Secondary | ICD-10-CM | POA: Insufficient documentation

## 2020-10-18 DIAGNOSIS — J069 Acute upper respiratory infection, unspecified: Secondary | ICD-10-CM | POA: Insufficient documentation

## 2020-10-18 DIAGNOSIS — Z20822 Contact with and (suspected) exposure to covid-19: Secondary | ICD-10-CM

## 2020-10-18 DIAGNOSIS — Z79899 Other long term (current) drug therapy: Secondary | ICD-10-CM | POA: Insufficient documentation

## 2020-10-18 DIAGNOSIS — R059 Cough, unspecified: Secondary | ICD-10-CM | POA: Diagnosis present

## 2020-10-18 DIAGNOSIS — Z794 Long term (current) use of insulin: Secondary | ICD-10-CM | POA: Diagnosis not present

## 2020-10-18 DIAGNOSIS — Z87891 Personal history of nicotine dependence: Secondary | ICD-10-CM | POA: Diagnosis not present

## 2020-10-18 DIAGNOSIS — I5021 Acute systolic (congestive) heart failure: Secondary | ICD-10-CM | POA: Insufficient documentation

## 2020-10-18 LAB — CBC WITH DIFFERENTIAL/PLATELET
Abs Immature Granulocytes: 0.01 10*3/uL (ref 0.00–0.07)
Basophils Absolute: 0 10*3/uL (ref 0.0–0.1)
Basophils Relative: 1 %
Eosinophils Absolute: 0.1 10*3/uL (ref 0.0–0.5)
Eosinophils Relative: 1 %
HCT: 38.8 % (ref 36.0–46.0)
Hemoglobin: 13.1 g/dL (ref 12.0–15.0)
Immature Granulocytes: 0 %
Lymphocytes Relative: 16 %
Lymphs Abs: 0.9 10*3/uL (ref 0.7–4.0)
MCH: 30 pg (ref 26.0–34.0)
MCHC: 33.8 g/dL (ref 30.0–36.0)
MCV: 89 fL (ref 80.0–100.0)
Monocytes Absolute: 0.5 10*3/uL (ref 0.1–1.0)
Monocytes Relative: 8 %
Neutro Abs: 4.1 10*3/uL (ref 1.7–7.7)
Neutrophils Relative %: 74 %
Platelets: 225 10*3/uL (ref 150–400)
RBC: 4.36 MIL/uL (ref 3.87–5.11)
RDW: 12.9 % (ref 11.5–15.5)
WBC: 5.6 10*3/uL (ref 4.0–10.5)
nRBC: 0 % (ref 0.0–0.2)

## 2020-10-18 LAB — BASIC METABOLIC PANEL
Anion gap: 7 (ref 5–15)
BUN: 9 mg/dL (ref 6–20)
CO2: 25 mmol/L (ref 22–32)
Calcium: 8.4 mg/dL — ABNORMAL LOW (ref 8.9–10.3)
Chloride: 107 mmol/L (ref 98–111)
Creatinine, Ser: 0.6 mg/dL (ref 0.44–1.00)
GFR, Estimated: >60 mL/min (ref >60)
Glucose, Bld: 100 mg/dL — ABNORMAL HIGH (ref 70–99)
Potassium: 3.4 mmol/L — ABNORMAL LOW (ref 3.5–5.1)
Sodium: 139 mmol/L (ref 135–145)

## 2020-10-18 LAB — URINALYSIS, ROUTINE W REFLEX MICROSCOPIC
Bilirubin Urine: NEGATIVE
Glucose, UA: >=500 mg/dL — AB
Hgb urine dipstick: NEGATIVE
Ketones, ur: NEGATIVE mg/dL
Leukocytes,Ua: NEGATIVE
Nitrite: NEGATIVE
Protein, ur: NEGATIVE mg/dL
Specific Gravity, Urine: <1.005 — ABNORMAL LOW (ref 1.005–1.030)
pH: 6 (ref 5.0–8.0)

## 2020-10-18 LAB — URINALYSIS, MICROSCOPIC (REFLEX)

## 2020-10-18 LAB — TROPONIN I (HIGH SENSITIVITY): Troponin I (High Sensitivity): 3 ng/L (ref <18)

## 2020-10-18 LAB — PREGNANCY, URINE: Preg Test, Ur: NEGATIVE

## 2020-10-18 NOTE — Discharge Instructions (Addendum)
Please check MyChart to look at your results of COVID test, likely will be there tomorrow morning.  Please call primary doctor to get a follow-up appointment regarding your symptoms.  If you develop worsening pain, difficulty breathing or other new concerning symptom, come back to ER for reassessment.

## 2020-10-18 NOTE — ED Triage Notes (Signed)
Pt c/o flu like sx started yesterday-c/o bd pain "I can't get my urine out" and body aches x 2 days-NAD-steady gait

## 2020-10-18 NOTE — ED Provider Notes (Signed)
[facility] EMERGENCY DEPARTMENT Provider Note   CSN: [id number] Arrival date & time: 10/18/20  1742     History Chief Complaint  Patient presents with   Cough    Karen Patrick is a 55 y.o. female.  Presents to ER with concern for cough.  Over the past couple days she has been having flulike symptoms.  Describes generalized body aches, fatigue, had chills at home.  Some cough but nonproductive.  Has history of nonischemic cardiomyopathy.  No difficulty in breathing.  Did note that when she coughs she does have some central chest discomfort.  Currently no chest pain.  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   Adjustment disorder with depressed mood 06/03/2016   Ankle fracture, right    Asthma    Cardiomyopathy (HCC) 11/06/2017   Non-ischemic November 2018   Depression    Diabetes mellitus    Drug overdose    Essential hypertension 03/05/2017   Overview:  Added automatically from request for surgery [id number] Added automatically from request for surgery [id number]   Fracture of right orbital floor with routine healing 10/12/2014   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 Last Assessment & Plan:  R ankle distal fibula avulsion fracture - significantly improved.  Add theraband and balance exercises moving forward (demonstrated today).  No evidence of DVT on exam - feel her pain in lateral calf and deep popliteal region is muscular - discussed warmth, redness, swelling - if occurs to seek care.  ASO for th   Midline thoracic back pain 03/06/2017   Mixed hyperlipidemia 08/26/2019   Myocardial infarction [facility])    Nonischemic cardiomyopathy (HCC) 08/26/2019   NSVT  (nonsustained ventricular tachycardia) (HCC) 07/03/2019   Obesity (BMI 30-39.9) 08/26/2019   Obstructive sleep apnea 06/26/2017   11/2017 PSG AHI: 5.4, REM AHI: 25, SpO2 nadir: 86% 04/2018 PAP PSG: CPAP 6cm  01/2019 started CPAP therapy   Other hyperlipidemia 06/06/2018   Other seasonal allergic rhinitis 03/30/2013   Formatting of this note might be different from the original. Overview:  Watery eyes and sinus pain   PAT (paroxysmal atrial tachycardia) (HCC) 07/03/2019   Right ankle injury 11/02/2010   Schizoaffective disorder (HCC)    Seasonal allergies 03/30/2013   Overview:  Watery eyes and sinus pain   Severe obesity (BMI 35.0-39.9) with comorbidity (HCC) 05/27/2019   Suicidal ideation    Type 2 diabetes mellitus without complications (HCC) 02/26/2013   Urinary tract infection 01/07/2014   UTI (urinary tract infection), bacterial 01/07/2014    Patient Active Problem List   Diagnosis Date Noted   Myocardial infarction [facility])    GERD (gastroesophageal reflux disease)    Depression    Asthma    Ankle fracture, right    Acute systolic congestive heart failure (HCC)    Nonischemic cardiomyopathy (HCC) 08/26/2019   Mixed hyperlipidemia 08/26/2019   Obesity (BMI 30-39.9) 08/26/2019   PAT (paroxysmal atrial tachycardia) (HCC) 07/03/2019   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   NSVT (nonsustained ventricular tachycardia) (HCC) 07/03/2019   Other hyperlipidemia 06/06/2018   ADHD (attention deficit hyperactivity disorder), 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   Cardiomyopathy (HCC) 11/06/2017   Obstructive sleep apnea 06/26/2017  Midline thoracic back pain 03/06/2017   Essential hypertension 03/05/2017   History of hypertension 03/05/2017   Adjustment disorder with depressed mood 06/03/2016   Drug overdose    Suicidal ideation    Fracture of right orbital floor with routine healing 10/12/2014   Urinary tract infection 01/07/2014   UTI (urinary tract infection), bacterial 01/07/2014   Acute pancreatitis 01/06/2014    Seasonal allergies 03/30/2013   Other seasonal allergic rhinitis 03/30/2013   Type 2 diabetes mellitus with hyperglycemia, with long-term current use of insulin (HCC) 02/26/2013   Type 2 diabetes mellitus without complications (HCC) 02/26/2013   Schizoaffective disorder (HCC) 06/04/2012   Injury of right ankle 11/02/2010   Right ankle injury 11/02/2010    Past Surgical History:  Procedure Laterality Date   BACK SURGERY     CESAREAN SECTION     ENDOMETRIAL ABLATION W/ NOVASURE     FOOT FASCIOTOMY     HAMMER TOE SURGERY     KNEE ARTHROCENTESIS     LIPOMA EXCISION       OB History   No obstetric history on file.     Family History  Problem Relation Age of Onset   Stroke Mother    Diabetes Mother    Hypertension Mother    Cancer Mother    Heart failure Mother    Stroke Father    Sudden death Father    Hypertension Father    Hyperlipidemia Father    Heart attack Father    Diabetes Father    Diabetes Maternal Grandmother    Heart attack Maternal Grandmother    Hypertension Maternal Grandmother    Diabetes Paternal Grandmother    Sudden death Paternal Grandmother    Cancer Paternal Aunt     Social History   Tobacco Use   Smoking status: Former    Pack years: 0.00    Types: Cigarettes    Start date: 1983    Quit date: 2006    Years since quitting: 16.4   Smokeless tobacco: Never  Vaping Use   Vaping Use: Never used  Substance Use Topics   Alcohol use: No   Drug use: No    Home Medications Prior to Admission medications   Medication Sig Start Date End Date Taking? Authorizing Provider  ascorbic acid (VITAMIN C) 500 MG tablet Take 500 mg by mouth daily.    [provider]  atomoxetine (STRATTERA) 40 MG capsule Take 1 capsule (40 mg total) by mouth every morning. 05/24/20   Sharlene DoryWendling, Nicholas Paul, DO  atorvastatin (LIPITOR) 20 MG tablet Take 1 tablet (20 mg total) by mouth daily. 08/24/20   Sharlene DoryWendling, Nicholas Paul, DO  carvedilol (COREG) 12.5 MG  tablet Take 1 tablet (12.5 mg total) by mouth 2 (two) times daily with a meal. 11/23/19   Tobb, Kardie, DO  Cranberry 400 MG TABS Take 400 mg by mouth daily.    [provider]  dapagliflozin propanediol (FARXIGA) 10 MG TABS tablet Take 1 tablet (10 mg total) by mouth daily before breakfast. 02/06/20   Wendling, Jilda RocheNicholas Paul, DO  estrogen, conjugated,-medroxyprogesterone (PREMPRO) 0.3-1.5 MG tablet Take 1 tablet by mouth daily. 12/03/14   [provider]  Boris LownKrill Oil 1000 MG CAPS Take 1,000 mg by mouth daily.    [provider]  liraglutide (VICTOZA) 18 MG/3ML SOPN Inject 1.8 mg into the skin daily. 01/29/20   Sharlene DoryWendling, Nicholas Paul, DO  metFORMIN (GLUCOPHAGE-XR) 500 MG 24 hr tablet TAKE TWO TABLETS BY MOUTH TWICE A DAY 09/27/20  Sharlene Dory, DO  Multiple Vitamin (MULTIVITAMIN) capsule Take 1 capsule by mouth daily.    [provider]  nitroGLYCERIN (NITROSTAT) 0.4 MG SL tablet Place 1 tablet (0.4 mg total) under the tongue every 5 (five) minutes as needed for chest pain. 02/02/20 05/02/20  Tobb, Kardie, DO  sacubitril-valsartan (ENTRESTO) 24-26 MG Take 1 tablet by mouth 2 (two) times daily. 02/29/20   Tobb, Kardie, DO  Semaglutide, 1 MG/DOSE, (OZEMPIC, 1 MG/DOSE,) 4 MG/3ML SOPN Inject 1 mg into the skin once a week. 06/13/20   Sharlene Dory, DO  spironolactone (ALDACTONE) 25 MG tablet TAKE ONE TABLET BY MOUTH DAILY 09/27/20   Sharlene Dory, DO  torsemide (DEMADEX) 20 MG tablet TAKE ONE TABLET BY MOUTH DAILY 10/06/20   Sharlene Dory, DO    Allergies    Sulfa antibiotics  Review of Systems   Review of Systems  Constitutional:  Positive for fatigue and fever. Negative for chills.  HENT:  Negative for ear pain and sore throat.   Eyes:  Negative for pain and visual disturbance.  Respiratory:  Positive for cough. Negative for shortness of breath.   Cardiovascular:  Positive for chest pain. Negative for palpitations.   Gastrointestinal:  Negative for abdominal pain and vomiting.  Genitourinary:  Negative for dysuria and hematuria.  Musculoskeletal:  Negative for arthralgias and back pain.  Skin:  Negative for color change and rash.  Neurological:  Negative for seizures and syncope.  All other systems reviewed and are negative.  Physical Exam Updated Vital Signs BP 130/82 (BP Location: Right Arm)   Pulse 89   Temp 98.7 F (37.1 C) (Oral)   Resp (!) 22   Ht 5\' 3"  (1.6 m)   Wt 77.1 kg   SpO2 96%   BMI 30.11 kg/m   Physical Exam Vitals and nursing note reviewed.  Constitutional:      General: She is not in acute distress.    Appearance: She is well-developed.  HENT:     Head: Normocephalic and atraumatic.  Eyes:     Conjunctiva/sclera: Conjunctivae normal.  Cardiovascular:     Rate and Rhythm: Normal rate and regular rhythm.     Heart sounds: No murmur heard. Pulmonary:     Effort: Pulmonary effort is normal. No respiratory distress.     Breath sounds: Normal breath sounds.  Abdominal:     Palpations: Abdomen is soft.     Tenderness: There is no abdominal tenderness.  Musculoskeletal:        General: No deformity or signs of injury.     Cervical back: Neck supple.  Skin:    General: Skin is warm and dry.  Neurological:     General: No focal deficit present.     Mental Status: She is alert.    ED Results / Procedures / Treatments   Labs (all labs ordered are listed, but only abnormal results are displayed) Labs Reviewed  SARS CORONAVIRUS 2 (TAT 6-24 HRS) - Abnormal; Notable for the following components:      Result Value   SARS Coronavirus 2 POSITIVE (*)    All other components within normal limits  URINALYSIS, ROUTINE W REFLEX MICROSCOPIC - Abnormal; Notable for the following components:   APPearance HAZY (*)    Specific Gravity, Urine <1.005 (*)    Glucose, UA >=500 (*)    All other components within normal limits  URINALYSIS, MICROSCOPIC (REFLEX) - Abnormal; Notable for  the following components:   Bacteria, UA RARE (*)  All other components within normal limits  BASIC METABOLIC PANEL - Abnormal; Notable for the following components:   Potassium 3.4 (*)    Glucose, Bld 100 (*)    Calcium 8.4 (*)    All other components within normal limits  PREGNANCY, URINE  CBC WITH DIFFERENTIAL/PLATELET  TROPONIN I (HIGH SENSITIVITY)    EKG EKG Interpretation  Date/Time:  Tuesday October 18 2020 21:49:07 EDT Ventricular Rate:  92 PR Interval:  173 QRS Duration: 91 QT Interval:  333 QTC Calculation: 412 R Axis:   8 Text Interpretation: Sinus arrhythmia Ventricular premature complex Confirmed by Marianna Fuss ([id number]) on 10/18/2020 9:52:21 PM Also confirmed by Marianna Fuss ([id number]), editor [place], [place]ne [phone])  on 10/19/2020 8:58:35 AM  Radiology DG Chest Portable 1 View  Result Date: 10/18/2020 CLINICAL DATA:  Cough and shortness of breath. Assess for pneumonia. EXAM: PORTABLE CHEST 1 VIEW COMPARISON:  Radiograph 07/23/2019 FINDINGS: The cardiomediastinal contours are normal. Mild bronchial thickening. Pulmonary vasculature is normal. No consolidation, pleural effusion, or pneumothorax. No acute osseous abnormalities are seen. Questionable bone infarcts in both proximal humeri, incompletely included in the field of view. IMPRESSION: Mild bronchial thickening. No consolidation to suggest pneumonia. Electronically Signed   By: Narda Rutherford M.D.   On: 10/18/2020 21:22    Procedures Procedures   Medications Ordered in ED Medications - No data to display  ED Course  I have reviewed the triage vital signs and the nursing notes.  Pertinent labs & imaging results that were available during my care of the patient were reviewed by me and considered in my medical decision making (see chart for details).    MDM Rules/Calculators/A&P                          55 year old lady presents to ER with concern for cough, congestion, body aches.  On exam  well-appearing in no distress.  Vitals are stable.  No hypoxia or tachypnea.  Check EKG, troponin given reported chest pain.  No acute changes, troponin within normal limits.  Given these findings and description of pain, doubt ACS, suspect most likely related to her frequent cough.  Chest x-ray looks clear.  Suspect viral upper respiratory illness.  We will send COVID testing.  Discussed with patient need to call and follow-up with her primary care doctor to discuss further treatment if COVID test comes back positive.  COVID test pending at time of discharge.    After the discussed management above, the patient was determined to be safe for discharge.  The patient was in agreement with this plan and all questions regarding their care were answered.  ED return precautions were discussed and the patient will return to the ED with any significant worsening of condition.  Makita Blow was evaluated in Emergency Department on 10/19/2020 for the symptoms described in the history of present illness. She was evaluated in the context of the global COVID-19 pandemic, which necessitated consideration that the patient might be at risk for infection with the SARS-CoV-2 virus that causes COVID-19. Institutional protocols and algorithms that pertain to the evaluation of patients at risk for COVID-19 are in a state of rapid change based on information released by regulatory bodies including the CDC and federal and state organizations. These policies and algorithms were followed during the patient's care in the ED.       Final Clinical Impression(s) / ED Diagnoses Final diagnoses:  Viral URI with cough  Suspected COVID-19 virus infection  Rx / DC Orders ED Discharge Orders     None        Milagros Loll, MD 10/19/20 1511

## 2020-10-19 LAB — SARS CORONAVIRUS 2 (TAT 6-24 HRS): SARS Coronavirus 2: POSITIVE — AB

## 2020-10-20 ENCOUNTER — Other Ambulatory Visit: Payer: Self-pay | Admitting: Family

## 2020-10-20 ENCOUNTER — Other Ambulatory Visit: Payer: Self-pay

## 2020-10-20 ENCOUNTER — Telehealth: Payer: Self-pay | Admitting: Family Medicine

## 2020-10-20 ENCOUNTER — Telehealth (INDEPENDENT_AMBULATORY_CARE_PROVIDER_SITE_OTHER): Payer: BC Managed Care – PPO | Admitting: Family

## 2020-10-20 ENCOUNTER — Encounter: Payer: Self-pay | Admitting: Family

## 2020-10-20 VITALS — Temp 99.1°F | Ht 63.0 in | Wt 168.0 lb

## 2020-10-20 DIAGNOSIS — U071 COVID-19: Secondary | ICD-10-CM

## 2020-10-20 DIAGNOSIS — N390 Urinary tract infection, site not specified: Secondary | ICD-10-CM

## 2020-10-20 MED ORDER — NIRMATRELVIR/RITONAVIR (PAXLOVID)TABLET: 3.0000 | ORAL_TABLET | Freq: Two times a day (BID) | ORAL | 0 refills | Status: DC

## 2020-10-20 MED ORDER — CEPHALEXIN 500 MG PO CAPS: 500.0000 mg | ORAL_CAPSULE | Freq: Two times a day (BID) | ORAL | 0 refills | Status: DC

## 2020-10-20 MED ORDER — NIRMATRELVIR/RITONAVIR (PAXLOVID)TABLET: 3.0000 | ORAL_TABLET | Freq: Two times a day (BID) | ORAL | 0 refills | Status: AC

## 2020-10-20 NOTE — Progress Notes (Signed)
Karen Patrick is a 55 y.o. 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   NSVT (nonsustained ventricular tachycardia) (HCC) 07/03/2019   Other hyperlipidemia 06/06/2018   ADHD (attention deficit hyperactivity disorder), 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   Cardiomyopathy (HCC) 11/06/2017   Obstructive sleep apnea 06/26/2017   Midline thoracic back pain 03/06/2017   Essential hypertension 03/05/2017   History of hypertension 03/05/2017   Adjustment disorder with depressed mood 06/03/2016   Drug overdose    Suicidal ideation    Fracture of right orbital floor with routine healing 10/12/2014   Urinary tract infection 01/07/2014   UTI (urinary tract infection), bacterial 01/07/2014   Acute pancreatitis 01/06/2014   Seasonal allergies 03/30/2013   Other seasonal allergic rhinitis 03/30/2013   Type 2 diabetes mellitus with hyperglycemia, with long-term current use of insulin (HCC) 02/26/2013   Type 2 diabetes mellitus without complications (HCC) 02/26/2013   Schizoaffective disorder (HCC) 06/04/2012   Injury of right ankle 11/02/2010   Right ankle injury 11/02/2010    Current Outpatient Medications  Medication Sig Dispense Refill   ascorbic acid (VITAMIN C) 500 MG tablet Take 500 mg by mouth daily.     atomoxetine (STRATTERA) 40 MG capsule Take 1 capsule (40 mg total) by mouth every morning. 90 capsule 2   atorvastatin (LIPITOR) 20 MG tablet Take 1 tablet (20 mg total) by mouth daily. 30 tablet 11   carvedilol (COREG) 12.5 MG tablet Take 1 tablet (12.5 mg  total) by mouth 2 (two) times daily with a meal. 180 tablet 3   cephALEXin (KEFLEX) 500 MG capsule Take 1 capsule (500 mg total) by mouth 2 (two) times daily. 10 capsule 0   Cranberry 400 MG TABS Take 400 mg by mouth daily.     dapagliflozin propanediol (FARXIGA) 10 MG TABS tablet Take 1 tablet (10 mg total) by mouth daily before breakfast. 30 tablet 3   Krill Oil 1000 MG CAPS Take 1,000 mg by mouth daily.     metFORMIN (GLUCOPHAGE-XR) 500 MG 24 hr tablet TAKE TWO TABLETS BY MOUTH TWICE A DAY 120 tablet 1   Multiple Vitamin (MULTIVITAMIN) capsule Take 1 capsule by mouth daily.     nirmatrelvir/ritonavir EUA (PAXLOVID) TABS Take 3 tablets by mouth 2 (two) times daily for 5 days. (Take nirmatrelvir 150 mg two tablets twice daily for 5 days and ritonavir 100 mg one tablet twice daily for 5 days) Patient GFR is 98.12 30 tablet 0   sacubitril-valsartan (ENTRESTO) 24-26 MG Take 1 tablet by mouth 2 (two) times daily. 60 tablet 3   Semaglutide, 1 MG/DOSE, (OZEMPIC, 1 MG/DOSE,) 4 MG/3ML SOPN Inject 1 mg into the skin once a week. 15 mL 1   spironolactone (ALDACTONE) 25 MG tablet TAKE ONE TABLET BY MOUTH DAILY 30 tablet 1   estrogen, conjugated,-medroxyprogesterone (PREMPRO) 0.3-1.5 MG tablet Take 1 tablet by mouth daily. (Patient not taking: Reported on 10/20/2020)     liraglutide (VICTOZA) 18 MG/3ML SOPN Inject 1.8 mg into the skin daily. (Patient not taking: Reported on 10/20/2020) 12 mL 1  nitroGLYCERIN (NITROSTAT) 0.4 MG SL tablet Place 1 tablet (0.4 mg total) under the tongue every 5 (five) minutes as needed for chest pain. 90 tablet 3   progesterone (PROMETRIUM) 200 MG capsule Take 200 mg by mouth at bedtime.     torsemide (DEMADEX) 20 MG tablet TAKE ONE TABLET BY MOUTH DAILY (Patient not taking: Reported on 10/20/2020) 30 tablet 3   No current facility-administered medications for this visit.    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   Adjustment disorder with depressed mood 06/03/2016   Ankle fracture, right    Asthma    Cardiomyopathy (HCC) 11/06/2017   Non-ischemic November 2018   Depression    Diabetes mellitus    Drug overdose    Essential hypertension 03/05/2017   Overview:  Added automatically from request for surgery [id number] Added automatically from request for surgery [phone]   Fracture of right orbital floor with routine healing 10/12/2014   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 Last Assessment & Plan:  R ankle distal fibula avulsion fracture - significantly improved.  Add theraband and balance exercises moving forward (demonstrated today).  No evidence of DVT on exam - feel her pain in lateral calf and deep popliteal region is muscular - discussed warmth, redness, swelling - if occurs to seek care.  ASO for th   Midline thoracic back pain 03/06/2017   Mixed hyperlipidemia 08/26/2019   Myocardial infarction [facility])    Nonischemic cardiomyopathy (HCC) 08/26/2019   NSVT (nonsustained ventricular tachycardia) (HCC) 07/03/2019   Obesity (BMI 30-39.9) 08/26/2019   Obstructive sleep apnea 06/26/2017   11/2017 PSG AHI: 5.4, REM AHI: 25, SpO2 nadir: 86% 04/2018 PAP PSG: CPAP 6cm  01/2019 started CPAP therapy   Other hyperlipidemia 06/06/2018   Other seasonal allergic rhinitis 03/30/2013   Formatting of this note might be different from the original. Overview:  Watery eyes and sinus pain   PAT (paroxysmal atrial tachycardia) (HCC) 07/03/2019   Right ankle injury 11/02/2010   Schizoaffective disorder (HCC)    Seasonal allergies 03/30/2013   Overview:  Watery eyes and sinus pain   Severe obesity (BMI 35.0-39.9) with comorbidity (HCC) 05/27/2019   Suicidal  ideation    Type 2 diabetes mellitus without complications (HCC) 02/26/2013   Urinary tract infection 01/07/2014   UTI (urinary tract infection), bacterial 01/07/2014    Past Surgical History:  Procedure Laterality Date   BACK SURGERY     CESAREAN SECTION     ENDOMETRIAL ABLATION W/ NOVASURE     FOOT FASCIOTOMY     HAMMER TOE SURGERY     KNEE ARTHROCENTESIS     LIPOMA EXCISION      Family History  Problem Relation Age of Onset   Stroke Mother    Diabetes Mother    Hypertension Mother    Cancer Mother    Heart failure Mother    Stroke Father    Sudden death Father    Hypertension Father    Hyperlipidemia Father    Heart attack Father    Diabetes Father    Diabetes Maternal Grandmother    Heart attack Maternal Grandmother    Hypertension Maternal Grandmother  Diabetes Paternal Grandmother    Sudden death Paternal Grandmother    Cancer Paternal Aunt     Social History   Tobacco Use   Smoking status: Former    Pack years: 0.00    Types: Cigarettes    Start date: 1983    Quit date: 2006    Years since quitting: 16.4   Smokeless tobacco: Never  Substance Use Topics   Alcohol use: No    Subjective:    I connected with [name] on 10/20/20 at 11:00 AM EDT by a telephone call  and verified that I am speaking with the correct person using two identifiers.   I discussed the limitations of evaluation and management by telemedicine and the availability of in person appointments. The patient expressed understanding and agreed to proceed. Provider in office/ patient is at home; provider and patient are only 2 people on telephone call.   Tested positive for COVID on 6/21; symptoms started on Monday 6/20; notes she does feel that her fever and diarrhea are improving; would like to discuss starting Paxlovid- was told by ER provider to call her PCP to get prescription. Patient notes she did not take her Lipitor this morning.   She also feels like she has a secondary UTI;  notes she discussed this with ER provider but U/A was inconclusive; + urinary burning, urgency;     Objective:  Vitals:   10/20/20 1057  Temp: 99.1 F (37.3 C)  TempSrc: Oral  Weight: 168 lb (76.2 kg)  Height: 5\' 3"  (1.6 m)    General: Well developed, well nourished, in no acute distress  Lungs: Respirations unlabored;  Neurologic: Alert and oriented; speech intact;   Assessment:  1. COVID-19   2. Urinary tract infection without hematuria, site unspecified     Plan:  Rx for Paxlovid- take as directed; she understands to hold her Lipitor while on the medication and for 5 days after finishing Paxlovid; increase fluids, rest and follow up worse, no better.  Rx for Keflex 500 mg bid x 5 days; follow up worse, no better.   Time spent 11 minutes  No follow-ups on file.  No orders of the defined types were placed in this encounter.   Requested Prescriptions   Signed Prescriptions Disp Refills   nirmatrelvir/ritonavir EUA (PAXLOVID) TABS 30 tablet 0    Sig: Take 3 tablets by mouth 2 (two) times daily for 5 days. (Take nirmatrelvir 150 mg two tablets twice daily for 5 days and ritonavir 100 mg one tablet twice daily for 5 days) Patient GFR is 98.12   cephALEXin (KEFLEX) 500 MG capsule 10 capsule 0    Sig: Take 1 capsule (500 mg total) by mouth 2 (two) times daily.

## 2020-10-20 NOTE — Telephone Encounter (Signed)
Patient called following up on prescription  Please advice

## 2020-10-20 NOTE — Addendum Note (Signed)
Addended by: Lisbeth Renshaw, Macoy Rodwell HUA on: 10/20/2020 02:07 PM   Modules accepted: Orders

## 2020-10-25 ENCOUNTER — Other Ambulatory Visit (HOSPITAL_BASED_OUTPATIENT_CLINIC_OR_DEPARTMENT_OTHER): Payer: Self-pay | Admitting: Family Medicine

## 2020-10-25 DIAGNOSIS — Z1231 Encounter for screening mammogram for malignant neoplasm of breast: Secondary | ICD-10-CM

## 2020-10-31 ENCOUNTER — Other Ambulatory Visit: Payer: Self-pay | Admitting: Family Medicine

## 2020-11-01 ENCOUNTER — Inpatient Hospital Stay (HOSPITAL_BASED_OUTPATIENT_CLINIC_OR_DEPARTMENT_OTHER): Admission: RE | Admit: 2020-11-01 | Payer: BC Managed Care – PPO | Source: Ambulatory Visit

## 2020-11-02 ENCOUNTER — Other Ambulatory Visit (HOSPITAL_BASED_OUTPATIENT_CLINIC_OR_DEPARTMENT_OTHER): Payer: Self-pay | Admitting: Family Medicine

## 2020-11-02 ENCOUNTER — Telehealth: Payer: Self-pay | Admitting: Family Medicine

## 2020-11-02 DIAGNOSIS — Z1231 Encounter for screening mammogram for malignant neoplasm of breast: Secondary | ICD-10-CM

## 2020-11-02 NOTE — Telephone Encounter (Signed)
Pt states she found a lump on her breast she told that she needs a diagnostic  order in order for her to have her mammogram. She did state she wanted it done at [facility]

## 2020-11-02 NOTE — Telephone Encounter (Signed)
Diagnostics have to done in [facility] like her routine mammogram was ordered in July but she has not had it done.

## 2020-11-02 NOTE — Telephone Encounter (Signed)
OK to order but it may have to be done at [facility]. Ty.

## 2020-11-08 ENCOUNTER — Encounter: Payer: Self-pay | Admitting: Family Medicine

## 2020-11-09 ENCOUNTER — Other Ambulatory Visit: Payer: Self-pay | Admitting: Cardiology

## 2020-11-10 ENCOUNTER — Other Ambulatory Visit (HOSPITAL_BASED_OUTPATIENT_CLINIC_OR_DEPARTMENT_OTHER): Payer: BC Managed Care – PPO

## 2020-12-01 ENCOUNTER — Other Ambulatory Visit: Payer: Self-pay | Admitting: Family Medicine

## 2020-12-01 ENCOUNTER — Other Ambulatory Visit: Payer: Self-pay | Admitting: Cardiology

## 2020-12-14 ENCOUNTER — Other Ambulatory Visit: Payer: Self-pay

## 2020-12-14 ENCOUNTER — Other Ambulatory Visit (HOSPITAL_COMMUNITY)
Admission: RE | Admit: 2020-12-14 | Discharge: 2020-12-14 | Disposition: A | Discharge status: Home / Self Care | Payer: BC Managed Care – PPO | Source: Ambulatory Visit | Attending: Family Medicine | Admitting: Family Medicine

## 2020-12-14 ENCOUNTER — Ambulatory Visit (INDEPENDENT_AMBULATORY_CARE_PROVIDER_SITE_OTHER): Payer: BC Managed Care – PPO | Admitting: Family Medicine

## 2020-12-14 ENCOUNTER — Encounter: Payer: Self-pay | Admitting: Family Medicine

## 2020-12-14 VITALS — BP 103/63 | HR 75 | Wt 163.0 lb

## 2020-12-14 DIAGNOSIS — I10 Essential (primary) hypertension: Secondary | ICD-10-CM | POA: Diagnosis not present

## 2020-12-14 DIAGNOSIS — Z79890 Hormone replacement therapy (postmenopausal): Secondary | ICD-10-CM

## 2020-12-14 DIAGNOSIS — Z794 Long term (current) use of insulin: Secondary | ICD-10-CM

## 2020-12-14 DIAGNOSIS — Z01419 Encounter for gynecological examination (general) (routine) without abnormal findings: Secondary | ICD-10-CM | POA: Insufficient documentation

## 2020-12-14 DIAGNOSIS — I428 Other cardiomyopathies: Secondary | ICD-10-CM

## 2020-12-14 DIAGNOSIS — Z1231 Encounter for screening mammogram for malignant neoplasm of breast: Secondary | ICD-10-CM | POA: Diagnosis not present

## 2020-12-14 DIAGNOSIS — E1165 Type 2 diabetes mellitus with hyperglycemia: Secondary | ICD-10-CM | POA: Diagnosis not present

## 2020-12-14 NOTE — Progress Notes (Signed)
GYNECOLOGY ANNUAL PREVENTATIVE CARE ENCOUNTER NOTE  Subjective:   Karen Patrick is a 55 y.o. [id number] female here for a routine annual gynecologic exam.  Current complaints: sees a PA-C for weight loss who injected pellets of bioidentical hormones for weight loss. Had previously used prempro for weight loss. Denies abnormal vaginal bleeding, discharge, pelvic pain, problems with intercourse or other gynecologic concerns.    Gynecologic History No LMP recorded. Patient has had an ablation. Patient is sexually active  Contraception: post menopausal status Last Pap: 5 years ago. Results were: normal Last mammogram: 2021. Results were: normal Colorectal cancer screening: colonoscopy 2019 - normal. Rpt in 10 years  Obstetric History OB History  Gravida Para Term Preterm AB Living  3 3 3     3   SAB IAB Ectopic Multiple Live Births          3    # Outcome Date GA Lbr Len/2nd Weight Sex Delivery Anes PTL Lv  3 Term 39 [redacted]w[redacted]d   M CS-LTranv Spinal N LIV  2 Term 1988 [redacted]w[redacted]d   F CS-LTranv Spinal N LIV  1 Term 75 [redacted]w[redacted]d   M CS-LTranv EPI N LIV    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   Adjustment disorder with depressed mood 06/03/2016   Ankle fracture, right    Asthma    Cardiomyopathy (HCC) 11/06/2017   Non-ischemic November 2018   Depression    Diabetes mellitus    Drug overdose    Essential hypertension 03/05/2017   Overview:  Added automatically from request for surgery 13/09/2016 Added automatically from request for surgery [phone]   Fracture of right orbital floor with routine healing 10/12/2014   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 Last Assessment & Plan:  R ankle  distal fibula avulsion fracture - significantly improved.  Add theraband and balance exercises moving forward (demonstrated today).  No evidence of DVT on exam - feel her pain in lateral calf and deep popliteal region is muscular - discussed warmth, redness, swelling - if occurs to seek care.  ASO for th   Midline thoracic back pain 03/06/2017   Mixed hyperlipidemia 08/26/2019   Myocardial infarction [facility])    Nonischemic cardiomyopathy (HCC) 08/26/2019   NSVT (nonsustained ventricular tachycardia) (HCC) 07/03/2019   Obesity (BMI 30-39.9) 08/26/2019   Obstructive sleep apnea 06/26/2017   11/2017 PSG AHI: 5.4, REM AHI: 25, SpO2 nadir: 86% 04/2018 PAP PSG: CPAP 6cm  01/2019 started CPAP therapy   Other hyperlipidemia 06/06/2018   Other seasonal allergic rhinitis 03/30/2013   Formatting of this note might be different from the original. Overview:  Watery eyes and sinus pain   PAT (paroxysmal atrial tachycardia) (HCC) 07/03/2019   Right ankle injury 11/02/2010   Schizoaffective disorder (HCC)    Seasonal allergies 03/30/2013   Overview:  Watery eyes and sinus pain   Severe obesity (BMI 35.0-39.9) with comorbidity (HCC) 05/27/2019   Suicidal ideation    Type 2 diabetes mellitus without complications (HCC) 02/26/2013   Urinary tract infection 01/07/2014   UTI (urinary tract infection), bacterial 01/07/2014    Past Surgical History:  Procedure Laterality Date   BACK SURGERY     CESAREAN SECTION  ENDOMETRIAL ABLATION W/ NOVASURE     FOOT FASCIOTOMY     HAMMER TOE SURGERY     KNEE ARTHROCENTESIS     LIPOMA EXCISION      Current Outpatient Medications on File Prior to Visit  Medication Sig Dispense Refill   ascorbic acid (VITAMIN C) 500 MG tablet Take 500 mg by mouth daily.     atomoxetine (STRATTERA) 40 MG capsule Take 1 capsule (40 mg total) by mouth every morning. 90 capsule 2   atorvastatin (LIPITOR) 20 MG tablet Take 1 tablet (20 mg total) by mouth daily. 30 tablet 11   carvedilol (COREG) 12.5 MG  tablet TAKE ONE TABLET BY MOUTH TWICE A DAY WITH MEALS 180 tablet 1   Cranberry 400 MG TABS Take 400 mg by mouth daily.     ENTRESTO 24-26 MG TAKE ONE TABLET BY MOUTH TWICE A DAY 180 tablet 1   FARXIGA 10 MG TABS tablet TAKE ONE TABLET BY MOUTH DAILY BEFORE BREAKFAST 30 tablet 3   Krill Oil 1000 MG CAPS Take 1,000 mg by mouth daily.     metFORMIN (GLUCOPHAGE-XR) 500 MG 24 hr tablet TAKE TWO TABLETS BY MOUTH TWICE A DAY 120 tablet 1   Multiple Vitamin (MULTIVITAMIN) capsule Take 1 capsule by mouth daily.     progesterone (PROMETRIUM) 200 MG capsule Take 200 mg by mouth at bedtime.     Semaglutide, 1 MG/DOSE, (OZEMPIC, 1 MG/DOSE,) 4 MG/3ML SOPN Inject 1 mg into the skin once a week. 15 mL 1   spironolactone (ALDACTONE) 25 MG tablet TAKE ONE TABLET BY MOUTH DAILY 30 tablet 1   torsemide (DEMADEX) 20 MG tablet TAKE ONE TABLET BY MOUTH DAILY 30 tablet 3   cephALEXin (KEFLEX) 500 MG capsule Take 1 capsule (500 mg total) by mouth 2 (two) times daily. 10 capsule 0   estrogen, conjugated,-medroxyprogesterone (PREMPRO) 0.3-1.5 MG tablet Take 1 tablet by mouth daily. (Patient not taking: Reported on 10/20/2020)     liraglutide (VICTOZA) 18 MG/3ML SOPN Inject 1.8 mg into the skin daily. (Patient not taking: Reported on 10/20/2020) 12 mL 1   nitroGLYCERIN (NITROSTAT) 0.4 MG SL tablet Place 1 tablet (0.4 mg total) under the tongue every 5 (five) minutes as needed for chest pain. 90 tablet 3   No current facility-administered medications on file prior to visit.    Allergies  Allergen Reactions   Sulfa Antibiotics     Social History   Socioeconomic History   Marital status: Married    Spouse name: Not on file   Number of children: Not on file   Years of education: Not on file   Highest education level: Not on file  Occupational History   Not on file  Tobacco Use   Smoking status: Former    Types: Cigarettes    Start date: 66    Quit date: 2006    Years since quitting: 16.6   Smokeless  tobacco: Never  Vaping Use   Vaping Use: Never used  Substance and Sexual Activity   Alcohol use: No   Drug use: No   Sexual activity: Yes    Birth control/protection: Surgical  Other Topics Concern   Not on file  Social History Narrative   Not on file   Social Determinants of Health   Financial Resource Strain: Not on file  Food Insecurity: Not on file  Transportation Needs: Not on file  Physical Activity: Not on file  Stress: Not on file  Social Connections: Not on file  Intimate Partner Violence: Not on file    Family History  Problem Relation Age of Onset   Stroke Mother    Diabetes Mother    Hypertension Mother    Cancer Mother    Heart failure Mother    Stroke Father    Sudden death Father    Hypertension Father    Hyperlipidemia Father    Heart attack Father    Diabetes Father    Diabetes Maternal Grandmother    Heart attack Maternal Grandmother    Hypertension Maternal Grandmother    Diabetes Paternal Grandmother    Sudden death Paternal Grandmother    Cancer Paternal Aunt     The following portions of the patient's history were reviewed and updated as appropriate: allergies, current medications, past family history, past medical history, past social history, past surgical history and problem list.  Review of Systems Pertinent items are noted in HPI.   Objective:  BP 103/63   Pulse 75   Wt 163 lb (73.9 kg)   BMI 28.87 kg/m  Wt Readings from Last 3 Encounters:  12/14/20 163 lb (73.9 kg)  10/20/20 168 lb (76.2 kg)  10/18/20 170 lb (77.1 kg)     Chaperone present during exam  CONSTITUTIONAL: Well-developed, well-nourished female in no acute distress.  HENT:  Normocephalic, atraumatic, External right and left ear normal. Oropharynx is clear and moist EYES: Conjunctivae and EOM are normal. Pupils are equal, round, and reactive to light. No scleral icterus.  NECK: Normal range of motion, supple, no masses.  Normal thyroid.   CARDIOVASCULAR:  Normal heart rate noted, regular rhythm RESPIRATORY: Clear to auscultation bilaterally. Effort and breath sounds normal, no problems with respiration noted. BREASTS: Symmetric in size. No masses, skin changes, nipple drainage, or lymphadenopathy. ABDOMEN: Soft, normal bowel sounds, no distention noted.  No tenderness, rebound or guarding.  PELVIC: Normal appearing external genitalia; normal appearing vaginal mucosa and cervix.  No abnormal discharge noted.   MUSCULOSKELETAL: Normal range of motion. No tenderness.  No cyanosis, clubbing, or edema.  2+ distal pulses. SKIN: Skin is warm and dry. No rash noted. Not diaphoretic. No erythema. No pallor. NEUROLOGIC: Alert and oriented to person, place, and time. Normal reflexes, muscle tone coordination. No cranial nerve deficit noted. PSYCHIATRIC: Normal mood and affect. Normal behavior. Normal judgment and thought content.  Assessment:  Annual gynecologic examination with pap smear   Plan:  1. Well Woman Exam Will follow up results of pap smear and manage accordingly. Mammogram scheduled STD testing discussed. Patient declined testing - Cytology - PAP( [place])  2. Encounter for screening mammogram for breast cancer Mamogram ordered - [facility] Digital Screening; Future  3. Type 2 diabetes mellitus with hyperglycemia, with long-term current use of insulin (HCC)  4. Essential hypertension  5. Nonischemic cardiomyopathy (HCC)  6. Hormone replacement therapy (HRT) Discussed HRT with patient - Breast Cancer risk score of 1.4%. Cardiovascular risk score of 4.17%. Has nonischemic cardiomyopathy. Transdermal route would be best for her.   Routine preventative health maintenance measures emphasized. Please refer to After Visit Summary for other counseling recommendations.    Candelaria Celeste, DO [facility]

## 2020-12-19 LAB — CYTOLOGY - PAP
Adequacy: ABSENT
Comment: NEGATIVE
Diagnosis: NEGATIVE
High risk HPV: NEGATIVE

## 2021-01-03 ENCOUNTER — Ambulatory Visit: Payer: BC Managed Care – PPO | Admitting: Cardiology

## 2021-01-04 ENCOUNTER — Encounter: Payer: Self-pay | Admitting: Cardiology

## 2021-01-16 ENCOUNTER — Other Ambulatory Visit: Payer: Self-pay

## 2021-01-16 ENCOUNTER — Ambulatory Visit (HOSPITAL_BASED_OUTPATIENT_CLINIC_OR_DEPARTMENT_OTHER)
Admission: RE | Admit: 2021-01-16 | Discharge: 2021-01-16 | Disposition: A | Discharge status: Home / Self Care | Payer: BC Managed Care – PPO | Source: Ambulatory Visit | Attending: Family Medicine | Admitting: Family Medicine

## 2021-01-16 ENCOUNTER — Encounter (HOSPITAL_BASED_OUTPATIENT_CLINIC_OR_DEPARTMENT_OTHER): Payer: Self-pay

## 2021-01-16 DIAGNOSIS — Z1231 Encounter for screening mammogram for malignant neoplasm of breast: Secondary | ICD-10-CM

## 2021-01-29 ENCOUNTER — Other Ambulatory Visit: Payer: Self-pay | Admitting: Family Medicine

## 2021-02-18 ENCOUNTER — Other Ambulatory Visit: Payer: Self-pay | Admitting: Family Medicine

## 2021-02-19 ENCOUNTER — Other Ambulatory Visit: Payer: Self-pay | Admitting: Family Medicine

## 2021-02-22 NOTE — Progress Notes (Signed)
Cardiology Office Note:    Date:  02/23/2021   ID:  Karen Patrick, DOB March 04, 1966, MRN [id number]  PCP:  Sharlene Dory, DO  Cardiologist:  Norman Herrlich, MD    Referring MD: Sharlene Dory*    ASSESSMENT:    1. Nonischemic cardiomyopathy (HCC)   2. Chronic combined systolic and diastolic heart failure (HCC)   3. Hypertensive heart disease with heart failure (HCC)   4. Type 2 diabetes mellitus without complication, without long-term current use of insulin (HCC)   5. Mixed hyperlipidemia   6. Frequent PVCs   7. NSVT (nonsustained ventricular tachycardia)    PLAN:    In order of problems listed above:  She has a nonischemic cardiomyopathy with a striking family history she is on good guideline directed medical therapy [place] Heart Association class I we will continue her current treatment.  Her echocardiogram May of this year shows improved LV dysfunction 45 to 50% this time would not advise an ICD with frequent PVCs we will repeat a 7-day event monitor check labs today including renal function proBNP I will see back in my office in 3 months BP at target continue guideline therapy Stable continue lifestyle changes and medical therapy SGLT2 inhibitor GLP-1 agonist and metformin Check lipid profile continue statin   Next appointment: 3 months   Medication Adjustments/Labs and Tests Ordered: Current medicines are reviewed at length with the patient today.  Concerns regarding medicines are outlined above.  No orders of the defined types were placed in this encounter.  No orders of the defined types were placed in this encounter.   Follow-up heart failure   History of Present Illness:    Karen Patrick is a 56 y.o. female with a hx of nonischemic cardiomyopathy most recent ejection fraction 50% type 2 diabetes hypertension hyperlipidemia and ventricular arrhythmia with nonsustained ventricular tachycardia last seen 07/05/2020.  A cardiac MRI was ordered at the  last visit. Her medical regimen consist of distal diuretic MRA beta-blocker Entresto and SGLT2 inhibitor. Her diabetic treatment consist of metformin and SGLT2 inhibitor and semaglutide  Compliance with diet, lifestyle and medications: Yes  Family history is noteworthy for heart failure she has several first-degree relatives in the [place] area with heart failure a sister who has cardiomyopathy heart failure is wearing a LifeVest and gets care in [place] where they are deciding whether or not to place an ICD her mother died in her early 73s of heart disease in her father with atrial fibrillation.  To her knowledge she has been no genetic testing  She is lost weight initially 230 pounds to 200 and now 253 with a supervised weight loss program and exercise.  Overall feels well aside having shortness of breath or edema and with stress she has palpitation and rarely chest pain relieved with nitroglycerin no orthopnea or PND  Recent echocardiogram 09/16/2020 shows reduced ejection fraction 45 to 50% and abnormal GLS -13.3 right ventricle normal size and function and there was no significant valvular abnormality. Cardiac CTA 02/26/2020 showed normal coronary arteriography and a calcium score of 0 Event monitor reported February 2021 showed frequent PVCs bigeminy trigeminy and 2 runs of PVCs the longest a complexes. Coronary angiography performed in 2018 [facility] showed normal coronary arteriography ejection fraction was estimated at 40%.  Echocardiogram at that time noted global hypokinesia EF 35 to 40% Past Medical History:  Diagnosis Date   Acute pancreatitis 01/06/2014   Acute systolic congestive heart failure (HCC)  ADHD (attention deficit hyperactivity disorder), 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   Adjustment disorder with depressed mood 06/03/2016   Ankle fracture, right    Asthma    Cardiomyopathy (HCC) 11/06/2017   Non-ischemic November 2018   Depression    Diabetes mellitus     Drug overdose    Essential hypertension 03/05/2017   Overview:  Added automatically from request for surgery [phone] Added automatically from request for surgery [phone]   Fracture of right orbital floor with routine healing 10/12/2014   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 Last Assessment & Plan:  R ankle distal fibula avulsion fracture - significantly improved.  Add theraband and balance exercises moving forward (demonstrated today).  No evidence of DVT on exam - feel her pain in lateral calf and deep popliteal region is muscular - discussed warmth, redness, swelling - if occurs to seek care.  ASO for th   Midline thoracic back pain 03/06/2017   Mixed hyperlipidemia 08/26/2019   Myocardial infarction [facility])    Nonischemic cardiomyopathy (HCC) 08/26/2019   NSVT (nonsustained ventricular tachycardia) 07/03/2019   Obesity (BMI 30-39.9) 08/26/2019   Obstructive sleep apnea 06/26/2017   11/2017 PSG AHI: 5.4, REM AHI: 25, SpO2 nadir: 86% 04/2018 PAP PSG: CPAP 6cm  01/2019 started CPAP therapy   Other hyperlipidemia 06/06/2018   Other seasonal allergic rhinitis 03/30/2013   Formatting of this note might be different from the original. Overview:  Watery eyes and sinus pain   PAT (paroxysmal atrial tachycardia) (HCC) 07/03/2019   Right ankle injury 11/02/2010   Schizoaffective disorder (HCC)    Seasonal allergies 03/30/2013   Overview:  Watery eyes and sinus pain   Severe obesity (BMI 35.0-39.9) with comorbidity (HCC) 05/27/2019   Suicidal ideation    Type 2 diabetes mellitus without complications (HCC) 02/26/2013   Urinary tract infection 01/07/2014   UTI (urinary tract infection), bacterial 01/07/2014    Past Surgical History:  Procedure Laterality Date   BACK SURGERY     CESAREAN SECTION     ENDOMETRIAL ABLATION W/ NOVASURE     FOOT FASCIOTOMY      HAMMER TOE SURGERY     KNEE ARTHROCENTESIS     LIPOMA EXCISION      Current Medications: Current Meds  Medication Sig   ascorbic acid (VITAMIN C) 500 MG tablet Take 500 mg by mouth daily.   atomoxetine (STRATTERA) 40 MG capsule TAKE 1 CAPSULE BY MOUTH EVERY MORNING   atorvastatin (LIPITOR) 20 MG tablet Take 1 tablet (20 mg total) by mouth daily.   carvedilol (COREG) 12.5 MG tablet TAKE ONE TABLET BY MOUTH TWICE A DAY WITH MEALS   Cranberry 400 MG TABS Take 400 mg by mouth daily.   ENTRESTO 24-26 MG TAKE ONE TABLET BY MOUTH TWICE A DAY   FARXIGA 10 MG TABS tablet TAKE ONE TABLET BY MOUTH DAILY BEFORE BREAKFAST   Krill Oil 1000 MG CAPS Take 1,000 mg by mouth daily.   metFORMIN (GLUCOPHAGE-XR) 500 MG 24 hr tablet TAKE TWO TABLETS BY MOUTH TWICE A DAY   Multiple Vitamin (MULTIVITAMIN) capsule Take 1 capsule by mouth daily.   nitroGLYCERIN (NITROSTAT) 0.4 MG SL tablet Place 1 tablet (0.4 mg total) under the tongue every 5 (five) minutes as needed for chest pain.   progesterone (PROMETRIUM) 200 MG capsule Take 200 mg by mouth at bedtime.  Semaglutide, 1 MG/DOSE, (OZEMPIC, 1 MG/DOSE,) 4 MG/3ML SOPN Inject 1 mg into the skin once a week.   spironolactone (ALDACTONE) 25 MG tablet TAKE ONE TABLET BY MOUTH DAILY   torsemide (DEMADEX) 20 MG tablet TAKE ONE TABLET BY MOUTH DAILY     Allergies:   Sulfa antibiotics   Social History   Socioeconomic History   Marital status: Married    Spouse name: Not on file   Number of children: Not on file   Years of education: Not on file   Highest education level: Not on file  Occupational History   Not on file  Tobacco Use   Smoking status: Former    Types: Cigarettes    Start date: 47    Quit date: 2006    Years since quitting: 16.8   Smokeless tobacco: Never  Vaping Use   Vaping Use: Never used  Substance and Sexual Activity   Alcohol use: No   Drug use: No   Sexual activity: Yes    Birth control/protection: Surgical  Other Topics  Concern   Not on file  Social History Narrative   Not on file   Social Determinants of Health   Financial Resource Strain: Not on file  Food Insecurity: Not on file  Transportation Needs: Not on file  Physical Activity: Not on file  Stress: Not on file  Social Connections: Not on file     Family History: The patient's family history includes Breast cancer in her maternal aunt, maternal aunt, maternal aunt, and paternal aunt; Cancer in her mother and paternal aunt; Diabetes in her father, maternal grandmother, mother, and paternal grandmother; Heart attack in her father and maternal grandmother; Heart failure in her mother; Hyperlipidemia in her father; Hypertension in her father, maternal grandmother, and mother; Stroke in her father and mother; Sudden death in her father and paternal grandmother. ROS:   Please see the history of present illness.    All other systems reviewed and are negative.  EKGs/Labs/Other Studies Reviewed:    The following studies were reviewed today:    Recent Labs: 02/29/2020: Magnesium 1.9 08/24/2020: ALT 14 10/18/2020: BUN 9; Creatinine, Ser 0.60; Hemoglobin 13.1; Platelets 225; Potassium 3.4; Sodium 139  Recent Lipid Panel    Component Value Date/Time   CHOL 142 08/24/2020 0908   TRIG 78.0 08/24/2020 0908   HDL 53.20 08/24/2020 0908   CHOLHDL 3 08/24/2020 0908   VLDL 15.6 08/24/2020 0908   LDLCALC 73 08/24/2020 0908   LDLCALC 52 02/05/2020 1039    Physical Exam:    VS:  BP 112/72   Pulse 82   Ht 5\' 3"  (1.6 m)   Wt 153 lb (69.4 kg)   SpO2 99%   BMI 27.10 kg/m     Wt Readings from Last 3 Encounters:  02/23/21 153 lb (69.4 kg)  12/14/20 163 lb (73.9 kg)  10/20/20 168 lb (76.2 kg)     GEN: She looks healthy well nourished, well developed in no acute distress HEENT: Normal NECK: No JVD; No carotid bruits LYMPHATICS: No lymphadenopathy CARDIAC: RRR, no murmurs, rubs, gallops RESPIRATORY:  Clear to auscultation without rales, wheezing  or rhonchi  ABDOMEN: Soft, non-tender, non-distended MUSCULOSKELETAL:  No edema; No deformity  SKIN: Warm and dry NEUROLOGIC:  Alert and oriented x 3 PSYCHIATRIC:  Normal affect    Signed, 10/22/20, MD  02/23/2021 8:19 AM    [facility]

## 2021-02-23 ENCOUNTER — Ambulatory Visit (INDEPENDENT_AMBULATORY_CARE_PROVIDER_SITE_OTHER): Payer: BC Managed Care – PPO | Admitting: Cardiology

## 2021-02-23 ENCOUNTER — Encounter: Payer: Self-pay | Admitting: Cardiology

## 2021-02-23 ENCOUNTER — Other Ambulatory Visit: Payer: Self-pay

## 2021-02-23 VITALS — BP 112/72 | HR 82 | Ht 63.0 in | Wt 153.0 lb

## 2021-02-23 DIAGNOSIS — I4729 Other ventricular tachycardia: Secondary | ICD-10-CM

## 2021-02-23 DIAGNOSIS — I110 Hypertensive heart disease with heart failure: Secondary | ICD-10-CM

## 2021-02-23 DIAGNOSIS — E119 Type 2 diabetes mellitus without complications: Secondary | ICD-10-CM

## 2021-02-23 DIAGNOSIS — I428 Other cardiomyopathies: Secondary | ICD-10-CM | POA: Diagnosis not present

## 2021-02-23 DIAGNOSIS — I5042 Chronic combined systolic (congestive) and diastolic (congestive) heart failure: Secondary | ICD-10-CM | POA: Diagnosis not present

## 2021-02-23 DIAGNOSIS — E782 Mixed hyperlipidemia: Secondary | ICD-10-CM

## 2021-02-23 DIAGNOSIS — I493 Ventricular premature depolarization: Secondary | ICD-10-CM

## 2021-02-23 NOTE — Patient Instructions (Addendum)
Medication Instructions:  Your physician recommends that you continue on your current medications as directed. Please refer to the Current Medication list given to you today.  *If you need a refill on your cardiac medications before your next appointment, please call your pharmacy*   Lab Work: Your physician recommends that you return for lab work in: TODAY CMP, Lipids, ProBNP If you have labs (blood work) drawn today and your tests are completely normal, you will receive your results only by: MyChart Message (if you have MyChart) OR A paper copy in the mail If you have any lab test that is abnormal or we need to change your treatment, we will call you to review the results.   Testing/Procedures: Please call us in December and we will order a 1 week zio monitor for you to wear at that time.   Follow-Up: At [facility], you and your health needs are our priority.  As part of our continuing mission to provide you with exceptional heart care, we have created designated Provider Care Teams.  These Care Teams include your primary Cardiologist (physician) and Advanced Practice Providers (APPs -  Physician Assistants and Nurse Practitioners) who all work together to provide you with the care you need, when you need it.  We recommend signing up for the patient portal called "MyChart".  Sign up information is provided on this After Visit Summary.  MyChart is used to connect with patients for Virtual Visits (Telemedicine).  Patients are able to view lab/test results, encounter notes, upcoming appointments, etc.  Non-urgent messages can be sent to your provider as well.   To learn more about what you can do with MyChart, go to [email or web address].    Your next appointment:   3 month(s)  The format for your next appointment:   In Person  Provider:   Norman Herrlich, MD   Other Instructions

## 2021-02-24 ENCOUNTER — Telehealth: Payer: Self-pay

## 2021-02-24 LAB — LIPID PANEL
Chol/HDL Ratio: 1.9 ratio (ref 0.0–4.4)
Cholesterol, Total: 109 mg/dL (ref 100–199)
HDL: 58 mg/dL (ref >39)
LDL Chol Calc (NIH): 39 mg/dL (ref 0–99)
Triglycerides: 53 mg/dL (ref 0–149)
VLDL Cholesterol Cal: 12 mg/dL (ref 5–40)

## 2021-02-24 LAB — COMPREHENSIVE METABOLIC PANEL
ALT: 13 IU/L (ref 0–32)
AST: 10 IU/L (ref 0–40)
Albumin/Globulin Ratio: 1.6 (ref 1.2–2.2)
Albumin: 3.9 g/dL (ref 3.8–4.9)
Alkaline Phosphatase: 70 IU/L (ref 44–121)
BUN/Creatinine Ratio: 10 (ref 9–23)
BUN: 6 mg/dL (ref 6–24)
Bilirubin Total: 0.4 mg/dL (ref 0.0–1.2)
CO2: 19 mmol/L — ABNORMAL LOW (ref 20–29)
Calcium: 9 mg/dL (ref 8.7–10.2)
Chloride: 107 mmol/L — ABNORMAL HIGH (ref 96–106)
Creatinine, Ser: 0.6 mg/dL (ref 0.57–1.00)
Globulin, Total: 2.5 g/dL (ref 1.5–4.5)
Glucose: 114 mg/dL — ABNORMAL HIGH (ref 70–99)
Potassium: 3.6 mmol/L (ref 3.5–5.2)
Sodium: 141 mmol/L (ref 134–144)
Total Protein: 6.4 g/dL (ref 6.0–8.5)
eGFR: 106 mL/min/{1.73_m2} (ref >59)

## 2021-02-24 LAB — PRO B NATRIURETIC PEPTIDE: NT-Pro BNP: 101 pg/mL (ref 0–287)

## 2021-02-24 NOTE — Telephone Encounter (Signed)
-----   Message from Baldo Daub, MD sent at 02/24/2021  7:35 AM EDT ----- All are good no changes

## 2021-02-24 NOTE — Telephone Encounter (Signed)
Spoke with patient regarding results and recommendation.  Patient verbalizes understanding and is agreeable to plan of care. Advised patient to call back with any issues or concerns.  

## 2021-02-28 ENCOUNTER — Telehealth: Payer: Self-pay | Admitting: Family Medicine

## 2021-02-28 ENCOUNTER — Other Ambulatory Visit: Payer: Self-pay | Admitting: Family Medicine

## 2021-02-28 DIAGNOSIS — Z794 Long term (current) use of insulin: Secondary | ICD-10-CM

## 2021-02-28 DIAGNOSIS — E1165 Type 2 diabetes mellitus with hyperglycemia: Secondary | ICD-10-CM

## 2021-02-28 MED ORDER — OZEMPIC (1 MG/DOSE) 4 MG/3ML ~~LOC~~ SOPN: 1.0000 mg | PEN_INJECTOR | SUBCUTANEOUS | 1 refills | Status: DC

## 2021-02-28 NOTE — Telephone Encounter (Signed)
Pt. States her insurance will end tomorrow and her new insurance wont kick in until 12/1. She wanted to see if this can be submitted before tomorrow.  Medication:  Semaglutide, 1 MG/DOSE, (OZEMPIC, 1 MG/DOSE,) 4 MG/3ML SOPN  Has the patient contacted their pharmacy? Yes.   (If no, request that the patient contact the pharmacy for the refill.) (If yes, when and what did the pharmacy advise?)  Preferred Pharmacy (with phone number or street name):  [facility] [id number] - [place], [place] - [address]  [address], [place] [address]  Phone:  [phone]  Fax:  [phone]  Agent: Please be advised that RX refills may take up to 3 business days. We ask that you follow-up with your pharmacy.

## 2021-02-28 NOTE — Telephone Encounter (Signed)
Sent in and patient informed 

## 2021-03-01 ENCOUNTER — Telehealth: Payer: Self-pay | Admitting: Family Medicine

## 2021-03-01 NOTE — Telephone Encounter (Signed)
Initiated PA for [employer] Have called [employer] at [phone] to complete PA--on hold had to hang up due to long wait. Will call again when have the available time.

## 2021-03-07 NOTE — Telephone Encounter (Signed)
PA for Ozempic has been approved

## 2021-03-16 ENCOUNTER — Other Ambulatory Visit: Payer: Self-pay

## 2021-03-26 ENCOUNTER — Other Ambulatory Visit: Payer: Self-pay | Admitting: Family Medicine

## 2021-04-21 ENCOUNTER — Other Ambulatory Visit: Payer: Self-pay | Admitting: Family Medicine

## 2021-04-21 DIAGNOSIS — Z794 Long term (current) use of insulin: Secondary | ICD-10-CM

## 2021-05-01 NOTE — Progress Notes (Signed)
Cardiology Office Note:    Date:  05/02/2021   ID:  Karen Patrick, DOB 04-30-1966, MRN [id number]  PCP:  Sharlene Dory, DO  Cardiologist:  Norman Herrlich, MD    Referring MD: Sharlene Dory*    ASSESSMENT:    1. Nonischemic cardiomyopathy (HCC)   2. Hypertensive heart disease with heart failure (HCC)   3. Frequent PVCs   4. Type 2 diabetes mellitus without complication, without long-term current use of insulin (HCC)   5. Mixed hyperlipidemia    PLAN:    In order of problems listed above:  She has had a marked improvement with guideline directed therapy and will continue her distal diuretic loop diuretic SGLT2 inhibitor Entresto and beta-blocker.  And will think she requires up titration today.  We will recheck echocardiogram for further improvement.  With her family history referred for genetic testing as treatment may be altered with recommendation for ICD depending on results Stable continue current guideline directed treatment Stable continue beta-blocker and will think we need to place another monitor at this time with a paucity of symptoms Well-controlled continue medical therapy metformin SGLT2 inhibitor Continue her statin lipids are at target   Next appointment: 3 months   Medication Adjustments/Labs and Tests Ordered: Current medicines are reviewed at length with the patient today.  Concerns regarding medicines are outlined above.  No orders of the defined types were placed in this encounter.  No orders of the defined types were placed in this encounter.   Chief Complaint  Patient presents with   Follow-up   Cardiomyopathy   History of Present Illness:    Karen Patrick is a 56 y.o. female with a hx of a nonischemic cardiomyopathy most recent ejection fraction 50% type 2 diabetes hypertensive heart disease hyperlipidemia and ventricular arrhythmia with nonsustained ventricular tachycardia last seen 02/23/2021 her family history is noteworthy for  heart failure with several first-degree relatives in the during the area and heart failure with his sister who has cardiomyopathy and is wearing a LifeVest and gets care in [place] and decision making whether or not to place an ICD.  Her mother died in her early 43s of heart disease and her father had atrial fibrillation.  She was advised genetic evaluation and referred at the time of her last visit.    Her most recent echocardiogram 09/16/2020 shows reduced ejection fraction 45 to 50% and abnormal GLS -13.3 right ventricle normal size and function and there was no significant valvular abnormality.  Cardiac CTA 02/26/2020 showed normal coronary arteriography and a calcium score of 0 Event monitor reported February 2021 showed frequent PVCs bigeminy trigeminy and 2 runs of PVCs the longest 8 complexes.  Coronary angiography performed in 2018 [facility] showed normal coronary arteriography ejection fraction was estimated at 40%.  Echocardiogram at that time noted global hypokinesia EF 35 to 40%  Compliance with diet, lifestyle and medications: Yes  She continues to be active exercise on a regular basis and has resolved all signs and symptoms of heart failure 1 episode of physical activity she noticed her heart beating and got an irregular pulse transiently.  She tolerates her medications she is lost 45 pounds with a supervised program and has her diabetes well controlled.  Her sister has normalized her ejection fraction and was not given an ICD.  We had referred for genetic testing and consultation and will place the referral again today. She is not having edema shortness of breath chest pain or syncope. 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   Adjustment disorder with  depressed mood 06/03/2016   Ankle fracture, right    Asthma    Cardiomyopathy (HCC) 11/06/2017   Non-ischemic November 2018   Depression    Diabetes mellitus    Drug overdose    Essential hypertension 03/05/2017   Overview:  Added automatically from request for surgery [id number] Added automatically from request for surgery [id number]   Fracture of right orbital floor with routine healing 10/12/2014   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 Last Assessment & Plan:  R ankle distal fibula avulsion fracture - significantly improved.  Add theraband and balance exercises moving forward (demonstrated today).  No evidence of DVT on exam - feel her pain in lateral calf and deep popliteal region is muscular - discussed warmth, redness, swelling - if occurs to seek care.  ASO for th   Midline thoracic back pain 03/06/2017   Mixed hyperlipidemia 08/26/2019   Myocardial infarction [facility])    Nonischemic cardiomyopathy (HCC) 08/26/2019   NSVT (nonsustained ventricular tachycardia) 07/03/2019   Obesity (BMI 30-39.9) 08/26/2019   Obstructive sleep apnea 06/26/2017   11/2017 PSG AHI: 5.4, REM AHI: 25, SpO2 nadir: 86% 04/2018 PAP PSG: CPAP 6cm  01/2019 started CPAP therapy   Other hyperlipidemia 06/06/2018   Other seasonal allergic rhinitis 03/30/2013   Formatting of this note might be different from the original. Overview:  Watery eyes and sinus pain   PAT (paroxysmal atrial tachycardia) (HCC) 07/03/2019   Right ankle injury 11/02/2010   Schizoaffective disorder (HCC)    Seasonal allergies 03/30/2013   Overview:  Watery eyes and sinus pain   Severe obesity (BMI 35.0-39.9) with comorbidity (HCC) 05/27/2019   Suicidal ideation    Type 2 diabetes mellitus without complications (HCC) 02/26/2013   Urinary tract infection 01/07/2014   UTI (urinary tract infection), bacterial  01/07/2014    Past Surgical History:  Procedure Laterality Date   BACK SURGERY     CESAREAN SECTION     ENDOMETRIAL ABLATION W/ NOVASURE     FOOT FASCIOTOMY     HAMMER TOE SURGERY     KNEE ARTHROCENTESIS     LIPOMA EXCISION      Current Medications: Current Meds  Medication Sig   ascorbic acid (VITAMIN C) 500 MG tablet Take 500 mg by mouth daily.   atomoxetine (STRATTERA) 40 MG capsule TAKE ONE CAPSULE BY MOUTH EVERY MORNING   atorvastatin (LIPITOR) 20 MG tablet Take 1 tablet (20 mg total) by mouth daily.   carvedilol (COREG) 12.5 MG tablet TAKE ONE TABLET BY MOUTH TWICE A DAY WITH MEALS   Cranberry 400 MG TABS Take 400 mg by mouth daily.   ENTRESTO 24-26 MG TAKE ONE TABLET BY MOUTH TWICE A DAY   FARXIGA 10 MG TABS tablet TAKE ONE TABLET BY MOUTH DAILY BEFORE BREAKFAST   Krill Oil 1000 MG CAPS Take 1,000 mg by mouth daily.   metFORMIN (GLUCOPHAGE-XR) 500 MG 24 hr tablet TAKE TWO TABLETS BY MOUTH TWICE A DAY   Multiple Vitamin (MULTIVITAMIN) capsule Take 1 capsule by mouth daily.   OZEMPIC, 1 MG/DOSE, 4 MG/3ML SOPN INJECT 1  MG INTO THE SKIN ONCE A WEEK   spironolactone (ALDACTONE) 25 MG tablet TAKE ONE TABLET BY MOUTH DAILY   torsemide (DEMADEX) 20 MG tablet TAKE ONE TABLET BY MOUTH DAILY     Allergies:   Sulfa antibiotics   Social History   Socioeconomic History   Marital status: Married    Spouse name: Not on file   Number of children: Not on file   Years of education: Not on file   Highest education level: Not on file  Occupational History   Not on file  Tobacco Use   Smoking status: Former    Types: Cigarettes    Start date: 67    Quit date: 2006    Years since quitting: 17.0   Smokeless tobacco: Never  Vaping Use   Vaping Use: Never used  Substance and Sexual Activity   Alcohol use: No   Drug use: No   Sexual activity: Yes    Birth control/protection: Surgical  Other Topics Concern   Not on file  Social History Narrative   Not on file   Social  Determinants of Health   Financial Resource Strain: Not on file  Food Insecurity: Not on file  Transportation Needs: Not on file  Physical Activity: Not on file  Stress: Not on file  Social Connections: Not on file     Family History: The patient's family history includes Breast cancer in her maternal aunt, maternal aunt, maternal aunt, and paternal aunt; Cancer in her mother and paternal aunt; Diabetes in her father, maternal grandmother, mother, and paternal grandmother; Heart attack in her father and maternal grandmother; Heart failure in her mother; Hyperlipidemia in her father; Hypertension in her father, maternal grandmother, and mother; Stroke in her father and mother; Sudden death in her father and paternal grandmother. ROS:   Please see the history of present illness.    All other systems reviewed and are negative.  EKGs/Labs/Other Studies Reviewed:    The following studies were reviewed today:   Recent Labs: 10/18/2020: Hemoglobin 13.1; Platelets 225 02/23/2021: ALT 13; BUN 6; Creatinine, Ser 0.60; NT-Pro BNP 101; Potassium 3.6; Sodium 141  Recent Lipid Panel    Component Value Date/Time   CHOL 109 02/23/2021 0832   TRIG 53 02/23/2021 0832   HDL 58 02/23/2021 0832   CHOLHDL 1.9 02/23/2021 0832   CHOLHDL 3 08/24/2020 0908   VLDL 15.6 08/24/2020 0908   LDLCALC 39 02/23/2021 0832   LDLCALC 52 02/05/2020 1039    Physical Exam:    VS:  BP 118/76 (BP Location: Right Arm, Patient Position: Sitting, Cuff Size: Normal)    Pulse 64    Ht 5\' 3"  (1.6 m)    Wt 161 lb 12.8 oz (73.4 kg)    BMI 28.66 kg/m     Wt Readings from Last 3 Encounters:  05/02/21 161 lb 12.8 oz (73.4 kg)  02/23/21 153 lb (69.4 kg)  12/14/20 163 lb (73.9 kg)     GEN:  Well nourished, well developed in no acute distress HEENT: Normal NECK: No JVD; No carotid bruits LYMPHATICS: No lymphadenopathy CARDIAC: RRR, no murmurs, rubs, gallops RESPIRATORY:  Clear to auscultation without rales, wheezing or  rhonchi  ABDOMEN: Soft, non-tender, non-distended MUSCULOSKELETAL:  No edema; No deformity  SKIN: Warm and dry NEUROLOGIC:  Alert and oriented x 3 PSYCHIATRIC:  Normal affect    Signed, 12/16/20, MD  05/02/2021 3:38 PM    [facility]

## 2021-05-02 ENCOUNTER — Ambulatory Visit: Payer: 59 | Admitting: Cardiology

## 2021-05-02 ENCOUNTER — Other Ambulatory Visit: Payer: Self-pay

## 2021-05-02 ENCOUNTER — Encounter: Payer: Self-pay | Admitting: Cardiology

## 2021-05-02 VITALS — BP 118/76 | HR 64 | Ht 63.0 in | Wt 161.8 lb

## 2021-05-02 DIAGNOSIS — R0602 Shortness of breath: Secondary | ICD-10-CM

## 2021-05-02 DIAGNOSIS — I493 Ventricular premature depolarization: Secondary | ICD-10-CM

## 2021-05-02 DIAGNOSIS — I428 Other cardiomyopathies: Secondary | ICD-10-CM

## 2021-05-02 DIAGNOSIS — E119 Type 2 diabetes mellitus without complications: Secondary | ICD-10-CM

## 2021-05-02 DIAGNOSIS — E782 Mixed hyperlipidemia: Secondary | ICD-10-CM

## 2021-05-02 DIAGNOSIS — I110 Hypertensive heart disease with heart failure: Secondary | ICD-10-CM

## 2021-05-02 NOTE — Patient Instructions (Signed)
Medication Instructions:  Your physician recommends that you continue on your current medications as directed. Please refer to the Current Medication list given to you today.  *If you need a refill on your cardiac medications before your next appointment, please call your pharmacy*   Lab Work: Your physician recommends that you return for lab work in: TODAY BMP, ProBNP If you have labs (blood work) drawn today and your tests are completely normal, you will receive your results only by: MyChart Message (if you have MyChart) OR A paper copy in the mail If you have any lab test that is abnormal or we need to change your treatment, we will call you to review the results.   Testing/Procedures: Your physician has requested that you have an echocardiogram. Echocardiography is a painless test that uses sound waves to create images of your heart. It provides your doctor with information about the size and shape of your heart and how well your heart's chambers and valves are working. This procedure takes approximately one hour. There are no restrictions for this procedure.    Follow-Up: At [facility], you and your health needs are our priority.  As part of our continuing mission to provide you with exceptional heart care, we have created designated Provider Care Teams.  These Care Teams include your primary Cardiologist (physician) and Advanced Practice Providers (APPs -  Physician Assistants and Nurse Practitioners) who all work together to provide you with the care you need, when you need it.  We recommend signing up for the patient portal called "MyChart".  Sign up information is provided on this After Visit Summary.  MyChart is used to connect with patients for Virtual Visits (Telemedicine).  Patients are able to view lab/test results, encounter notes, upcoming appointments, etc.  Non-urgent messages can be sent to your provider as well.   To learn more about what you can do with MyChart, go to  [email or web address].    Your next appointment:   3 month(s)  The format for your next appointment:   In Person  Provider:   Brian Munley, MD    Other Instructions   

## 2021-05-03 ENCOUNTER — Telehealth: Payer: Self-pay

## 2021-05-03 LAB — PRO B NATRIURETIC PEPTIDE: NT-Pro BNP: 94 pg/mL (ref 0–287)

## 2021-05-03 LAB — BASIC METABOLIC PANEL
BUN/Creatinine Ratio: 29 — ABNORMAL HIGH (ref 9–23)
BUN: 16 mg/dL (ref 6–24)
CO2: 23 mmol/L (ref 20–29)
Calcium: 9.3 mg/dL (ref 8.7–10.2)
Chloride: 107 mmol/L — ABNORMAL HIGH (ref 96–106)
Creatinine, Ser: 0.56 mg/dL — ABNORMAL LOW (ref 0.57–1.00)
Glucose: 100 mg/dL — ABNORMAL HIGH (ref 70–99)
Potassium: 4.1 mmol/L (ref 3.5–5.2)
Sodium: 142 mmol/L (ref 134–144)
eGFR: 107 mL/min/{1.73_m2} (ref >59)

## 2021-05-03 NOTE — Telephone Encounter (Signed)
Spoke with patient regarding results and recommendation.  Patient verbalizes understanding and is agreeable to plan of care. Advised patient to call back with any issues or concerns.  

## 2021-05-03 NOTE — Telephone Encounter (Signed)
-----   Message from Hazle Quant, RN sent at 05/03/2021  7:46 AM EST -----  ----- Message ----- From: Baldo Daub, MD Sent: 05/03/2021   7:36 AM EST To: Cv Div Ash/Hp Triage  Normal or stable result

## 2021-05-05 ENCOUNTER — Ambulatory Visit (HOSPITAL_BASED_OUTPATIENT_CLINIC_OR_DEPARTMENT_OTHER)
Admission: RE | Admit: 2021-05-05 | Discharge: 2021-05-05 | Disposition: A | Discharge status: Home / Self Care | Payer: 59 | Source: Ambulatory Visit | Attending: Cardiology | Admitting: Cardiology

## 2021-05-05 ENCOUNTER — Other Ambulatory Visit: Payer: Self-pay

## 2021-05-05 DIAGNOSIS — I110 Hypertensive heart disease with heart failure: Secondary | ICD-10-CM | POA: Insufficient documentation

## 2021-05-05 DIAGNOSIS — E119 Type 2 diabetes mellitus without complications: Secondary | ICD-10-CM | POA: Diagnosis not present

## 2021-05-05 DIAGNOSIS — I428 Other cardiomyopathies: Secondary | ICD-10-CM | POA: Insufficient documentation

## 2021-05-05 DIAGNOSIS — R0602 Shortness of breath: Secondary | ICD-10-CM | POA: Diagnosis present

## 2021-05-05 DIAGNOSIS — E782 Mixed hyperlipidemia: Secondary | ICD-10-CM | POA: Diagnosis present

## 2021-05-05 DIAGNOSIS — I493 Ventricular premature depolarization: Secondary | ICD-10-CM | POA: Diagnosis present

## 2021-05-05 LAB — ECHOCARDIOGRAM COMPLETE
AR max vel: 2.46 cm2
AV Area VTI: 2.45 cm2
AV Area mean vel: 2.33 cm2
AV Mean grad: 2 mmHg
AV Peak grad: 4.3 mmHg
Ao pk vel: 1.04 m/s
Area-P 1/2: 2.5 cm2
Calc EF: 47.3 %
S' Lateral: 3.3 cm
Single Plane A2C EF: 47.6 %
Single Plane A4C EF: 44.4 %

## 2021-05-08 ENCOUNTER — Telehealth: Payer: Self-pay

## 2021-05-08 NOTE — Telephone Encounter (Signed)
-----   Message from Baldo Daub, MD sent at 05/05/2021  5:27 PM EST ----- Good result heart muscle function further improved now normal.

## 2021-05-08 NOTE — Telephone Encounter (Signed)
Left message on patients voicemail to please return our call.   

## 2021-05-20 ENCOUNTER — Other Ambulatory Visit: Payer: Self-pay | Admitting: Family Medicine

## 2021-05-22 ENCOUNTER — Telehealth: Payer: Self-pay

## 2021-05-22 NOTE — Telephone Encounter (Signed)
Completed  PA request form questions Faxed completed form/requested office note to [phone].

## 2021-05-22 NOTE — Telephone Encounter (Signed)
PA initiated via Covermymeds; KEY:  [id number]. Awaiting determination.

## 2021-05-23 ENCOUNTER — Other Ambulatory Visit: Payer: Self-pay | Admitting: Cardiology

## 2021-05-23 NOTE — Telephone Encounter (Signed)
PA approved.   PA Case: [id number], Status: Approved, Coverage Starts on: 05/22/2021 12:00 AM, Coverage Ends on: 05/22/2022 12:00 AM. Questions? Contact [phone]

## 2021-06-23 ENCOUNTER — Other Ambulatory Visit: Payer: Self-pay | Admitting: Family Medicine

## 2021-07-21 ENCOUNTER — Other Ambulatory Visit: Payer: Self-pay | Admitting: Family Medicine

## 2021-08-01 ENCOUNTER — Ambulatory Visit: Payer: 59 | Admitting: Genetic Counselor

## 2021-08-03 ENCOUNTER — Telehealth: Payer: Self-pay

## 2021-08-03 DIAGNOSIS — I428 Other cardiomyopathies: Secondary | ICD-10-CM

## 2021-08-03 NOTE — Progress Notes (Signed)
Pre Test GC ? ?Referring Provider: Norman Herrlich, MD  ? ?Referral Reason  ?Karen Patrick was referred for genetic consult and testing for Non-ischemic cardiomyopathy (NICM) considering the significant family history of CHF at a young age in several family members. ? ?Personal Medical Information ?Karen Patrick (III.1 on pedigree) is a pleasant 56 year-old [place] American woman who works as a Airline pilot. She was told that she had a ?swollen heart? at age 46 at [facility] when she went for worsening dyspnea. She quit smoking in 2018 but did not notice any significant improvement in her breathing. She tells me that she was treated for respiratory issues at that time.  ? ?In 2019, when she was at a f/u visit with her cardiologist after her M.I, she was told that her EF was very low. She resolved to change her lifestyle and lost 54 lbs since then. She tells me that she can now breathe better and a recent cardiac screening detected normalized EF.  ? ?Traditional Risk Factors ?Karen Patrick denies having other cardiac or systemic conditions that can cause non-ischemic cardiomyopathy, namely, myocarditis, infiltrative myocardial disease (amyloidosis, sarcoidosis, hemochromatosis), infection with HIV virus, connective tissue disease (such as systemic lupus erythematosus etc.), doxorubicin therapy and of having other cardiovascular diseases (valvular heart disease, HCM) ? 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 She had a M.I at age 23 and was diagnosed with hypertension at 67. ?  ?Family history ?Santiago (III.1) has three children- her youngest son, age 18 was adopted out of the family (IV.3) and she is not aware of his current health status. Her older son, age 52 and daughter, age 13 (IV.1, IV.2) are in great health. Her brother (III.2) died at 45 in an accident. She tells me that he was high on cocaine and alcohol and was racing a friend on his motorbike when they crashed  into each other. He died of TBI. Her younger sister, age 78 (III.3) was diagnosed with chronic CHF at age 74. Her sister has not abused drugs or alcohol and does not have any of the other risk factors associated with NICM. Karen Patrick states that her sister's symptoms are worse than hers. ? ?Karen Patrick's father (II.6) had a history of Afib, HTN and stroke at age51. He died at 21 from polysubstance abuse. She reports three paternal uncle with CHF (II.2, II.4, II.5)- one committed suicide in his 75s (II.2), another died of a MI. (II.4) and the third uncle died in a freak accident. She tells me that he was a Journalist, newspaper and was digging a large hole to fill with concrete. He was found dead in the hole much later when the family came looking for him. Paternal grandfather (I.1) had a congenital heart condition- she does not know the details of his condition. Paternal grandmother (I.3) died at 73 from deep vein thrombosis in her legs. ? ?Karen Patrick's mother (II.7) died 2 years after a stroke from a brain aneurysm left her in a vegetative state. There are not reports of heart disease amongst her maternal aunts and uncle (II.8-II.12). Her maternal grandmother was [place] who apparently poisoned her husband (I.3) for several years. She was found to have CHF in her 55s, later had a 3 vessel CABG. In her 31s, her heart condition worsened leading to her death at 79. ? ?Pre-test Genetic Consult notes  ?I reviewed the different genetic cardiomyopathies that are considered non-ischemic cardiomyopathy, namely ARVC, DCM, HCM and LVNC. I  discussed the genetics of HCM, DCM, LVNC and ACM, namely inheritance, incomplete penetrance, variable expression, and digenic/compound mutations that can be seen in some patients. We walked through the process of genetic testing. I explained to her that there are three possible outcomes of genetic testing; namely positive, negative, and finding a variant of unknown significance. A positive outcome can be  expected in cases that do not have risk factors for a cardiomyopathy, present early in life with increased severity and have a family history of sudden cardiac death and/or a relative that has been diagnosed with cardiomyopathy. Limitations in current genetic testing methodology can produce a negative result. Variants of unknown significance (VUS) are also observed. I explained to her that typically a VUS is so classified if the variant is not well understood as very few individuals have been reported to harbor this variant or its role in gene function has not been elucidated. The potential outcomes of genetic testing and subsequent management of at-risk family members were discussed to manage expectations.  ? ?Impression  ?In summary, Karen Patrick's early age and severity of clinical presentation and, the dramatic family history of CHF at a very young age in her sister and other paternal relatives is indicative of a genetic condition. However, her clinical presentation is confounded by her long history of substance abuse. It is likely that she inherited this condition from her paternal lineage. Genetic testing for genes implicated in nonischemic cardiomyopathy is highly recommended to confirm her diagnosis. The test will determine the underlying genetic basis of her condition and stratify risk of NICM in her family.  ? ?In addition, we discussed the protections afforded by the Genetic Information Non-Discrimination Act (GINA). I explained to her that GINA protects her from losing her employment or health insurance based on her genotype. However, these protections do not cover life insurance and disability. She verbalized understanding and states that her children do not have life insurance. ? ?Please note that the patient has not been counseled in this visit on personal, cultural, or ethical issues that she may face due to her heart condition.  ? ?Plan ?After a thorough discussion of the risk and benefits of genetic  testing for NICM, Karen Patrick states her intent to pursue genetic testing for NICM and signed the informed consent form. Blood was drawn today for testing.  ? ?Sidney Ace, Ph.D, [facility] ?Clinical Molecular Geneticist ? ?

## 2021-08-03 NOTE — Telephone Encounter (Signed)
-----   Message from Baldo Daub, MD sent at 08/02/2021  9:46 AM EDT ----- ?Regarding: FW: NICM genetic test order ?Please enter order for test ?----- Message ----- ?From: Glennon Mac, PhD ?Sent: 08/02/2021   9:33 AM EDT ?To: Baldo Daub, MD ?Subject: NICM genetic test order                       ? ?Good morning, Dr. Raylene Miyamoto ? ?Thank you for referring Karen Patrick for a NICM genetic consult. She is willing to proceed with genetic testing and her blood was drawn yesterday. ? ?Can you please place an order in Epic for NICM genetic testing so we can get started on her prior authorization. To place the order, go to Lab orders, select [facility] and then choose NICM. ? ?Thanks ?[place]y ? ?

## 2021-08-08 ENCOUNTER — Encounter: Payer: 59 | Admitting: Genetic Counselor

## 2021-08-14 NOTE — Progress Notes (Signed)
?Cardiology Office Note:   ? ?Date:  08/15/2021  ? ?ID:  Karen Patrick, DOB [birth year], MRN [id number] ? ?PCP:  Shelda Pal, DO  ?Cardiologist:  Shirlee More, MD   ? ?Referring MD: Shelda Pal*  ? ? ?ASSESSMENT:   ? ?1. Nonischemic cardiomyopathy ([place])   ?2. Hypertensive heart disease with heart failure ([place])   ?3. Frequent PVCs   ? ?PLAN:   ? ?In order of problems listed above: ? ?Clinically she is doing very well with her cardiomyopathy on good guideline directed therapy New York Heart Association class I on maximally tolerated dose.  Await results of genetic testing.  Last ejection fraction improved to 50% recheck labs today BMP proBNP. ?Hyperlipidemia continue statin ? ? ?Next appointment: 4 months ? ? ?Medication Adjustments/Labs and Tests Ordered: ?Current medicines are reviewed at length with the patient today.  Concerns regarding medicines are outlined above.  ?Orders Placed This Encounter  ?Procedures  ? Comprehensive Metabolic Panel (CMET)  ? Pro b natriuretic peptide (BNP)[facility])  ? Lipid Profile  ? EKG 12-Lead  ? ?No orders of the defined types were placed in this encounter. ? ? ?Chief Complaint  ?Patient presents with  ? Follow-up  ? Cardiomyopathy  ? Congestive Heart Failure  ? ? ?History of Present Illness:   ? ?Karen Patrick is a 56 y.o. female with a hx of a nonischemic cardiomyopathy most recent ejection fraction 50% type 2 diabetes hypertensive heart disease hyperlipidemia and ventricular arrhythmia with nonsustained ventricular tachycardia last seen 01/03/2023her family history is noteworthy for heart failure with several first-degree relatives in the during the area and heart failure with his sister who has cardiomyopathy and is wearing a LifeVest and gets care in [place] and decision making whether or not to place an ICD.  Her mother died in her early 39s of heart disease and her father had atrial fibrillation.  She was last seen  05/02/2021. ?Compliance with diet, lifestyle and medications: Yes ? ?She feels better than normal remarkably improved weight is down 40 pounds no edema shortness of breath chest pain palpitation or syncope and she is in a good exercise activity program ?She was seen in genetic consultation and is waiting results of testing ?We discussed uptitrating her Delene Loll but she has had symptomatic episodes and blood pressure in the 90s. ?She is on good medical regimen including loop diuretic MRA SGLT2 inhibitor carvedilol and Entresto. ? ?Echo 05/05/2021: ? 1. Left ventricular ejection fraction, by estimation, is 50%%. The left  ?ventricle has low normal function. The left ventricle has no regional wall  ?motion abnormalities. There is mild concentric left ventricular  ?hypertrophy. Left ventricular diastolic  ?parameters are consistent with Grade I diastolic dysfunction (impaired  ?relaxation). The average left ventricular global longitudinal strain is  ?-13.3 %. The global longitudinal strain is abnormal.  ? 2. Right ventricular systolic function is normal. The right ventricular  ?size is normal.  ? 3. The mitral valve is normal in structure. No evidence of mitral valve  ?regurgitation. No evidence of mitral stenosis.  ? 4. The aortic valve is tricuspid. Aortic valve regurgitation is not  ?visualized. No aortic stenosis is present.  ? 5. The inferior vena cava is normal in size with greater than 50%  ?respiratory variability, suggesting right atrial pressure of 3 mmHg.  ?Past Medical History:  ?Diagnosis Date  ? Acute pancreatitis 01/06/2014  ? Acute systolic congestive heart failure ([place])   ? ADHD (attention deficit hyperactivity disorder), inattentive type  01/07/2018  ? Adjustment disorder with depressed mood 06/03/2016  ? Ankle fracture, right   ? Asthma   ? Cardiomyopathy ([place]) 11/06/2017  ? Non-ischemic November 2018  ? Depression   ? Diabetes mellitus   ? Drug overdose   ? Essential hypertension 03/05/2017  ? Overview:   Added automatically from request for surgery [phone] Added automatically from request for surgery [phone]  ? Fracture of right orbital floor with routine healing 10/12/2014  ? 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 GERD (gastroesophageal reflux disease)   ? History of hypertension 03/05/2017  ? Injury of right ankle 11/02/2010  ? Formatting of this note might be different from the original. Last Assessment & Plan:  R ankle distal fibula avulsion fracture - significantly improved.  Add theraband and balance exercises moving forward (demonstrated today).  No evidence of DVT on exam - feel her pain in lateral calf and deep popliteal region is muscular - discussed warmth, redness, swelling - if occurs to seek care.  ASO for th  ? Midline thoracic back pain 03/06/2017  ? Mixed hyperlipidemia 08/26/2019  ? Myocardial infarction [facility])   ? Nonischemic cardiomyopathy ([place]) 08/26/2019  ? NSVT (nonsustained ventricular tachycardia) ([place]) 07/03/2019  ? Obesity (BMI 30-39.9) 08/26/2019  ? Obstructive sleep apnea 06/26/2017  ? 11/2017 PSG AHI: 5.4, REM AHI: 25, SpO2 nadir: 86% 04/2018 PAP PSG: CPAP 6cm  01/2019 started CPAP therapy  ? Other hyperlipidemia 06/06/2018  ? Other seasonal allergic rhinitis 03/30/2013  ? Formatting of this note might be different from the original. Overview:  Watery eyes and sinus pain  ? PAT (paroxysmal atrial tachycardia) ([place]) 07/03/2019  ? Right ankle injury 11/02/2010  ? Schizoaffective disorder ([place])   ? Seasonal allergies 03/30/2013  ? Overview:  Watery eyes and sinus pain  ? Severe obesity (BMI 35.0-39.9) with comorbidity ([place]) 05/27/2019  ? Suicidal ideation   ? Type 2 diabetes mellitus without complications ([place]) [id number]  ? Urinary tract infection 01/07/2014  ? UTI (urinary tract infection), bacterial 01/07/2014  ? ? ?Past Surgical History:  ?Procedure Laterality Date  ? BACK SURGERY    ? CESAREAN SECTION    ? ENDOMETRIAL ABLATION W/ NOVASURE    ? FOOT FASCIOTOMY    ? HAMMER TOE SURGERY    ? KNEE ARTHROCENTESIS    ? LIPOMA  EXCISION    ? ? ?Current Medications: ?Current Meds  ?Medication Sig  ? ascorbic acid (VITAMIN C) 500 MG tablet Take 500 mg by mouth daily.  ? atomoxetine (STRATTERA) 40 MG capsule TAKE ONE CAPSULE BY MOUTH EVERY MORNING  ? atorvastatin (LIPITOR) 20 MG tablet Take 1 tablet (20 mg total) by mouth daily.  ? carvedilol (COREG) 12.5 MG tablet TAKE ONE TABLET BY MOUTH TWICE A DAY WITH MEALS  ? Cranberry 400 MG TABS Take 400 mg by mouth daily.  ? ENTRESTO 24-26 MG TAKE ONE TABLET BY MOUTH TWICE A DAY  ? FARXIGA 10 MG TABS tablet TAKE ONE TABLET BY MOUTH DAILY BEFORE BREAKFAST  ? Krill Oil 1000 MG CAPS Take 1,000 mg by mouth daily.  ? metFORMIN (GLUCOPHAGE-XR) 500 MG 24 hr tablet TAKE TWO TABLETS BY MOUTH TWICE A DAY  ? Multiple Vitamin (MULTIVITAMIN) capsule Take 1 capsule by mouth daily.  ? OZEMPIC, 1 MG/DOSE, 4 MG/3ML SOPN INJECT 1 MG INTO THE SKIN ONCE A WEEK  ? spironolactone (ALDACTONE) 25 MG tablet TAKE ONE TABLET BY MOUTH DAILY  ? torsemide (DEMADEX) 20 MG tablet TAKE ONE TABLET BY MOUTH DAILY  ?  ? ?  Allergies:   Sulfa antibiotics  ? ?Social History  ? ?Socioeconomic History  ? Marital status: Married  ?  Spouse name: Not on file  ? Number of children: Not on file  ? Years of education: Not on file  ? Highest education level: Not on file  ?Occupational History  ? Not on file  ?Tobacco Use  ? Smoking status: Former  ?  Types: Cigarettes  ?  Start date: 62  ?  Quit date: 2006  ?  Years since quitting: 17.3  ? Smokeless tobacco: Never  ?Vaping Use  ? Vaping Use: Never used  ?Substance and Sexual Activity  ? Alcohol use: No  ? Drug use: No  ? Sexual activity: Yes  ?  Birth control/protection: Surgical  ?Other Topics Concern  ? Not on file  ?Social History Narrative  ? Not on file  ? ?Social Determinants of Health  ? ?Financial Resource Strain: Not on file  ?Food Insecurity: Not on file  ?Transportation Needs: Not on file  ?Physical Activity: Not on file  ?Stress: Not on file  ?Social Connections: Not on file  ?   ? ?Family History: ?The patient's family history includes Breast cancer in her maternal aunt, maternal aunt, maternal aunt, and paternal aunt; Cancer in her mother and paternal aunt; Diabetes in her father, maternal grandmother,

## 2021-08-15 ENCOUNTER — Encounter: Payer: Self-pay | Admitting: Cardiology

## 2021-08-15 ENCOUNTER — Ambulatory Visit: Payer: 59 | Admitting: Cardiology

## 2021-08-15 VITALS — BP 118/68 | HR 76 | Ht 63.0 in | Wt 153.0 lb

## 2021-08-15 DIAGNOSIS — I493 Ventricular premature depolarization: Secondary | ICD-10-CM | POA: Diagnosis not present

## 2021-08-15 DIAGNOSIS — I110 Hypertensive heart disease with heart failure: Secondary | ICD-10-CM | POA: Diagnosis not present

## 2021-08-15 DIAGNOSIS — I428 Other cardiomyopathies: Secondary | ICD-10-CM | POA: Diagnosis not present

## 2021-08-15 NOTE — Patient Instructions (Signed)
Medication Instructions:  ?Your physician recommends that you continue on your current medications as directed. Please refer to the Current Medication list given to you today. ? ?*If you need a refill on your cardiac medications before your next appointment, please call your pharmacy* ? ? ?Lab Work: ?Your physician recommends that you return for lab work in:  ? ?Labs today in [address]: CMP, Pro BNP, Lipids ? ?If you have labs (blood work) drawn today and your tests are completely normal, you will receive your results only by: ?MyChart Message (if you have MyChart) OR ?A paper copy in the mail ?If you have any lab test that is abnormal or we need to change your treatment, we will call you to review the results. ? ? ?Testing/Procedures: ?None ? ? ?Follow-Up: ?At [facility], you and your health needs are our priority.  As part of our continuing mission to provide you with exceptional heart care, we have created designated Provider Care Teams.  These Care Teams include your primary Cardiologist (physician) and Advanced Practice Providers (APPs -  Physician Assistants and Nurse Practitioners) who all work together to provide you with the care you need, when you need it. ? ?We recommend signing up for the patient portal called "MyChart".  Sign up information is provided on this After Visit Summary.  MyChart is used to connect with patients for Virtual Visits (Telemedicine).  Patients are able to view lab/test results, encounter notes, upcoming appointments, etc.  Non-urgent messages can be sent to your provider as well.   ?To learn more about what you can do with MyChart, go to [email or web address].   ? ?Your next appointment:   ?6 month(s) ? ?The format for your next appointment:   ?In Person ? ?Provider:   ?Shirlee More, MD  ? ? ?Other Instructions ?None ? ?Important Information About Sugar ? ? ? ? ? ? ?

## 2021-08-16 LAB — COMPREHENSIVE METABOLIC PANEL
ALT: 12 IU/L (ref 0–32)
AST: 15 IU/L (ref 0–40)
Albumin/Globulin Ratio: 1.7 (ref 1.2–2.2)
Albumin: 4.5 g/dL (ref 3.8–4.9)
Alkaline Phosphatase: 73 IU/L (ref 44–121)
BUN/Creatinine Ratio: 26 — ABNORMAL HIGH (ref 9–23)
BUN: 17 mg/dL (ref 6–24)
Bilirubin Total: 0.3 mg/dL (ref 0.0–1.2)
CO2: 23 mmol/L (ref 20–29)
Calcium: 9.9 mg/dL (ref 8.7–10.2)
Chloride: 104 mmol/L (ref 96–106)
Creatinine, Ser: 0.66 mg/dL (ref 0.57–1.00)
Globulin, Total: 2.6 g/dL (ref 1.5–4.5)
Glucose: 96 mg/dL (ref 70–99)
Potassium: 4.3 mmol/L (ref 3.5–5.2)
Sodium: 141 mmol/L (ref 134–144)
Total Protein: 7.1 g/dL (ref 6.0–8.5)
eGFR: 103 mL/min/{1.73_m2} (ref >59)

## 2021-08-16 LAB — LIPID PANEL
Chol/HDL Ratio: 1.9 ratio (ref 0.0–4.4)
Cholesterol, Total: 146 mg/dL (ref 100–199)
HDL: 77 mg/dL
LDL Chol Calc (NIH): 51 mg/dL (ref 0–99)
Triglycerides: 100 mg/dL (ref 0–149)
VLDL Cholesterol Cal: 18 mg/dL (ref 5–40)

## 2021-08-16 LAB — PRO B NATRIURETIC PEPTIDE: NT-Pro BNP: 73 pg/mL (ref 0–287)

## 2021-08-17 NOTE — Progress Notes (Signed)
Unable to reach the patient, letter mailed with results ?

## 2021-08-19 ENCOUNTER — Other Ambulatory Visit: Payer: Self-pay | Admitting: Family Medicine

## 2021-08-25 ENCOUNTER — Encounter: Payer: Self-pay | Admitting: Family Medicine

## 2021-08-25 ENCOUNTER — Ambulatory Visit (INDEPENDENT_AMBULATORY_CARE_PROVIDER_SITE_OTHER): Payer: 59 | Admitting: Family Medicine

## 2021-08-25 VITALS — BP 108/70 | HR 67 | Temp 98.1°F | Ht 62.0 in | Wt 154.0 lb

## 2021-08-25 DIAGNOSIS — F900 Attention-deficit hyperactivity disorder, predominantly inattentive type: Secondary | ICD-10-CM

## 2021-08-25 DIAGNOSIS — E1165 Type 2 diabetes mellitus with hyperglycemia: Secondary | ICD-10-CM

## 2021-08-25 DIAGNOSIS — G4733 Obstructive sleep apnea (adult) (pediatric): Secondary | ICD-10-CM

## 2021-08-25 DIAGNOSIS — Z1159 Encounter for screening for other viral diseases: Secondary | ICD-10-CM | POA: Diagnosis not present

## 2021-08-25 DIAGNOSIS — Z794 Long term (current) use of insulin: Secondary | ICD-10-CM

## 2021-08-25 MED ORDER — ATOMOXETINE HCL 60 MG PO CAPS: 60.0000 mg | ORAL_CAPSULE | Freq: Every day | ORAL | 2 refills | Status: DC

## 2021-08-25 NOTE — Progress Notes (Signed)
Subjective:  ? ?Chief Complaint  ?Patient presents with  ? Follow-up  ?  Hgba1c ?Back and knee pain ?Change strattera  ? ? ?Karen Patrick is a 56 y.o. female here for follow-up of diabetes.   ?[name]'s self monitored glucose range is low 100's.  ?Patient confirms hypoglycemic reactions. ?She checks her glucose levels 1 time(s) per day. ?Patient does not require insulin.   ?Medications include: Metformin XR 500 mg bid, Ozempic 1 mg/week, Farxiga 10 mg/d.  ?Diet is healthy.  ?Exercise: HIIT, cardio ? ?ADHD ?Having trouble staying on task and concentrating for past few months. Taking Strattera 40 mg/d. Reports compliance, no AE's.  ? ?Past Medical History:  ?Diagnosis Date  ? Acute pancreatitis 01/06/2014  ? Acute systolic congestive heart failure ([place])   ? 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 Adjustment disorder with depressed mood 06/03/2016  ? Ankle fracture, right   ? Asthma   ? Cardiomyopathy ([place]) 11/06/2017  ? Non-ischemic November 2018  ? Depression   ? Diabetes mellitus   ? Drug overdose   ? Essential hypertension 03/05/2017  ? Overview:  Added automatically from request for surgery [phone] Added automatically from request for surgery [phone]  ? Fracture of right orbital floor with routine healing 10/12/2014  ? 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 GERD (gastroesophageal reflux disease)   ? History of hypertension 03/05/2017  ? Injury of right ankle 11/02/2010  ? Formatting of this note might be different from the original. Last Assessment & Plan:  R ankle distal fibula avulsion fracture - significantly improved.  Add theraband and balance exercises moving forward (demonstrated today).  No evidence of DVT on exam - feel her pain in lateral calf and deep popliteal region is muscular - discussed warmth, redness, swelling - if occurs to seek care.  ASO for th  ? Midline thoracic back pain 03/06/2017  ? Mixed hyperlipidemia 08/26/2019  ? Myocardial infarction [facility])   ? Nonischemic cardiomyopathy  ([place]) 08/26/2019  ? NSVT (nonsustained ventricular tachycardia) ([place]) 07/03/2019  ? Obesity (BMI 30-39.9) 08/26/2019  ? Obstructive sleep apnea 06/26/2017  ? 11/2017 PSG AHI: 5.4, REM AHI: 25, SpO2 nadir: 86% 04/2018 PAP PSG: CPAP 6cm  01/2019 started CPAP therapy  ? Other hyperlipidemia 06/06/2018  ? Other seasonal allergic rhinitis 03/30/2013  ? Formatting of this note might be different from the original. Overview:  Watery eyes and sinus pain  ? PAT (paroxysmal atrial tachycardia) ([place]) 07/03/2019  ? Right ankle injury 11/02/2010  ? Schizoaffective disorder ([place])   ? Seasonal allergies 03/30/2013  ? Overview:  Watery eyes and sinus pain  ? Severe obesity (BMI 35.0-39.9) with comorbidity ([place]) 05/27/2019  ? Suicidal ideation   ? Type 2 diabetes mellitus without complications ([place]) [id number]  ? Urinary tract infection 01/07/2014  ? UTI (urinary tract infection), bacterial 01/07/2014  ?  ? ?Related testing: ?Retinal exam: Done ?Pneumovax: done ? ?Objective:  ?BP 108/70   Pulse 67   Temp 98.1 ?F (36.7 ?C) (Oral)   Ht 5\' 2"  (1.575 m)   Wt 154 lb (69.9 kg)   SpO2 99%   BMI 28.17 kg/m?  ?General:  Well developed, well nourished, in no apparent distress ?Skin:  Warm, no pallor or diaphoresis ?Head:  Normocephalic, atraumatic ?Eyes:  Pupils equal and round, sclera anicteric without injection  ?Lungs:  CTAB, no access msc use ?Cardio:  RRR, no bruits, no LE edema ?Musculoskeletal:  Symmetrical muscle groups noted without atrophy or deformity ?Neuro:  Sensation intact to pinprick  on feet ?Psych: Age appropriate judgment and insight ? ?Assessment:  ? ?Type 2 diabetes mellitus with hyperglycemia, with long-term current use of insulin (HCC) - Plan: Microalbumin / creatinine urine ratio, Hemoglobin A1c ? ?ADHD (attention deficit hyperactivity disorder), inattentive type - Plan: atomoxetine (STRATTERA) 60 MG capsule ? ?Encounter for hepatitis C screening test for low risk patient - Plan: Hepatitis C antibody  ? ?Plan:  ? ?Chronic, stable.  Stop Metformin due to low sugars. Has done a great job losing wt. Cont Ozempic 1 mg/week, Farxiga 10 mg/d. Ck labs today. Counseled on diet and exercise. ?Chronic, unstable. Increase Strattera from 40 mg/d to 60 mg/d. F/u in 1 mo. ?The patient voiced understanding and agreement to the plan. ? ?Shelda Pal, DO ?08/25/21 ?3:15 PM ? ?

## 2021-08-25 NOTE — Patient Instructions (Signed)
Give us 2-3 business days to get the results of your labs back.   Keep the diet clean and stay active.  Let us know if you need anything. 

## 2021-08-26 LAB — MICROALBUMIN / CREATININE URINE RATIO
Creatinine, Urine: 48 mg/dL (ref 20–275)
Microalb Creat Ratio: 6 mcg/mg creat (ref <30)
Microalb, Ur: 0.3 mg/dL

## 2021-08-28 LAB — HEPATITIS C ANTIBODY
Hepatitis C Ab: NONREACTIVE
SIGNAL TO CUT-OFF: 0.09 (ref <1.00)

## 2021-08-28 LAB — HEMOGLOBIN A1C
Hgb A1c MFr Bld: 6.1 % of total Hgb — ABNORMAL HIGH (ref <5.7)
Mean Plasma Glucose: 128 mg/dL
eAG (mmol/L): 7.1 mmol/L

## 2021-09-01 ENCOUNTER — Other Ambulatory Visit (HOSPITAL_BASED_OUTPATIENT_CLINIC_OR_DEPARTMENT_OTHER): Payer: Self-pay

## 2021-09-01 ENCOUNTER — Encounter (HOSPITAL_BASED_OUTPATIENT_CLINIC_OR_DEPARTMENT_OTHER): Payer: Self-pay | Admitting: Pediatrics

## 2021-09-01 ENCOUNTER — Other Ambulatory Visit: Payer: Self-pay

## 2021-09-01 ENCOUNTER — Emergency Department (HOSPITAL_BASED_OUTPATIENT_CLINIC_OR_DEPARTMENT_OTHER)
Admission: EM | Admit: 2021-09-01 | Discharge: 2021-09-01 | Disposition: A | Discharge status: Home / Self Care | Payer: 59 | Attending: Emergency Medicine | Admitting: Emergency Medicine

## 2021-09-01 DIAGNOSIS — I110 Hypertensive heart disease with heart failure: Secondary | ICD-10-CM | POA: Diagnosis not present

## 2021-09-01 DIAGNOSIS — N390 Urinary tract infection, site not specified: Secondary | ICD-10-CM

## 2021-09-01 DIAGNOSIS — E119 Type 2 diabetes mellitus without complications: Secondary | ICD-10-CM | POA: Insufficient documentation

## 2021-09-01 DIAGNOSIS — I509 Heart failure, unspecified: Secondary | ICD-10-CM | POA: Insufficient documentation

## 2021-09-01 DIAGNOSIS — R1030 Lower abdominal pain, unspecified: Secondary | ICD-10-CM | POA: Diagnosis present

## 2021-09-01 LAB — URINALYSIS, ROUTINE W REFLEX MICROSCOPIC
Bilirubin Urine: NEGATIVE
Glucose, UA: >=500 mg/dL — AB
Ketones, ur: NEGATIVE mg/dL
Nitrite: NEGATIVE
Protein, ur: NEGATIVE mg/dL
Specific Gravity, Urine: 1.015 (ref 1.005–1.030)
pH: 5 (ref 5.0–8.0)

## 2021-09-01 LAB — WET PREP, GENITAL
Clue Cells Wet Prep HPF POC: NONE SEEN
Sperm: NONE SEEN
Trich, Wet Prep: NONE SEEN
WBC, Wet Prep HPF POC: >=10 — AB (ref <10)
Yeast Wet Prep HPF POC: NONE SEEN

## 2021-09-01 LAB — URINALYSIS, MICROSCOPIC (REFLEX): WBC, UA: >50 WBC/hpf (ref 0–5)

## 2021-09-01 MED ORDER — NITROFURANTOIN MONOHYD MACRO 100 MG PO CAPS
100.0000 mg | ORAL_CAPSULE | Freq: Two times a day (BID) | ORAL | 0 refills | Status: DC
  Filled 2021-09-01: qty 10, 5d supply, fill #0

## 2021-09-01 NOTE — ED Triage Notes (Signed)
C/o lower abdominal pain and vaginal discharge started creamy white and now greenish; reported was seen at UC over the weekend and was diagnose with BV; currently metronidazole BID; patient c/o painful urination and hesitancy;  ?

## 2021-09-01 NOTE — ED Provider Notes (Signed)
?[facility] EM[facility] DEPARTMENT ?Provider Note ? ? ?CSN: [id number] ?Arrival date & time: 09/01/21  [id number] ? ?  ? ?History ? ?Chief Complaint  ?Patient presents with  ? Abdominal Pain  ? ? ?Karen Patrick is a 56 y.o. female with a past medical history of type 2 diabetes, hypertension, heart failure, MI and multiple UTIs presenting today with abdominal discomfort.  She says that she began to see white vaginal discharge on Friday and went to urgent care on Saturday.  She told them that she thought she had bacterial vaginosis and they gave her Flagyl.  She is on day 6 of Flagyl without any relief.  She says that on Sunday she has not seen white discharge however it became yellow/green.  Denies any bleeding and has not noticed any odor.  Sexually active with 1 female partner in a monogamous relationship.  Reports that her lower abdominal pain started at the same time as the symptoms.  Endorsing nausea but takes Ozempic and says that she is always nauseous.LMP 2007 after ablation. ? ? ?Abdominal Pain ?Associated symptoms: diarrhea, nausea and vaginal discharge   ?Associated symptoms: no chills and no fever   ? ?  ? ?Home Medications ?Prior to Admission medications   ?Medication Sig Start Date End Date Taking? Authorizing Provider  ?ascorbic acid (VITAMIN C) 500 MG tablet Take 500 mg by mouth daily.    [provider]  ?atomoxetine (STRATTERA) 60 MG capsule Take 1 capsule (60 mg total) by mouth daily. 08/25/21   Shelda Pal, DO  ?atorvastatin (LIPITOR) 20 MG tablet TAKE 1 TABLET BY MOUTH DAILY 08/21/21   Shelda Pal, DO  ?carvedilol (COREG) 12.5 MG tablet TAKE ONE TABLET BY MOUTH TWICE A DAY WITH MEALS 05/23/21   Tobb, Godfrey Pick, DO  ?Cranberry 400 MG TABS Take 400 mg by mouth daily.    [provider]  ?ENTRESTO 24-26 MG TAKE ONE TABLET BY MOUTH TWICE A DAY 11/09/20   Tobb, Kardie, DO  ?FARXIGA 10 MG TABS tablet TAKE ONE TABLET BY MOUTH DAILY BEFORE BREAKFAST 02/20/21   Shelda Pal, DO  ?Krill Oil 1000 MG CAPS Take 1,000 mg by mouth daily.    [provider]  ?Multiple Vitamin (MULTIVITAMIN) capsule Take 1 capsule by mouth daily.    [provider]  ?nitroGLYCERIN (NITROSTAT) 0.4 MG SL tablet Place 1 tablet (0.4 mg total) under the tongue every 5 (five) minutes as needed for chest pain. 02/02/20 02/23/21  Berniece Salines, DO  ?OZEMPIC, 1 MG/DOSE, 4 MG/3ML SOPN INJECT 1 MG INTO THE SKIN ONCE A WEEK 04/21/21   Shelda Pal, DO  ?spironolactone (ALDACTONE) 25 MG tablet TAKE ONE TABLET BY MOUTH DAILY 08/21/21   Shelda Pal, DO  ?torsemide (DEMADEX) 20 MG tablet TAKE ONE TABLET BY MOUTH DAILY 06/23/21   Shelda Pal, DO  ?   ? ?Allergies    ?Sulfa antibiotics   ? ?Review of Systems   ?Review of Systems  ?Constitutional:  Negative for chills and fever.  ?Gastrointestinal:  Positive for abdominal pain, diarrhea and nausea.  ?Genitourinary:  Positive for vaginal discharge. Negative for vaginal pain.  ? ?Physical Exam ?Updated Vital Signs ?BP 122/90 (BP Location: Right Arm)   Pulse 72   Temp 98.6 ?F (37 ?C) (Oral)   Resp 18   Ht 5\' 2"  (1.575 m)   Wt 69.4 kg   SpO2 99%   BMI 27.98 kg/m?  ?Physical Exam ?Vitals and nursing note reviewed.  ?  Constitutional:   ?   Appearance: Normal appearance.  ?HENT:  ?   Head: Normocephalic and atraumatic.  ?Eyes:  ?   General: No scleral icterus. ?   Conjunctiva/sclera: Conjunctivae normal.  ?Pulmonary:  ?   Effort: Pulmonary effort is normal. No respiratory distress.  ?Abdominal:  ?   Tenderness: There is abdominal tenderness in the suprapubic area.  ?Genitourinary: ?   Comments: Deferred ?Skin: ?   Findings: No rash.  ?Neurological:  ?   Mental Status: She is alert.  ?Psychiatric:     ?   Mood and Affect: Mood normal.  ? ? ?ED Results / Procedures / Treatments   ?Labs ?(all labs ordered are listed, but only abnormal results are displayed) ?Labs Reviewed  ?WET PREP, GENITAL - Abnormal; Notable for the  following components:  ?    Result Value  ? WBC, Wet Prep HPF POC >=10 (*)   ? All other components within normal limits  ?URINALYSIS, ROUTINE W REFLEX MICROSCOPIC - Abnormal; Notable for the following components:  ? APPearance CLOUDY (*)   ? Glucose, UA >=500 (*)   ? Hgb urine dipstick MODERATE (*)   ? Leukocytes,Ua LARGE (*)   ? All other components within normal limits  ?URINALYSIS, MICROSCOPIC (REFLEX) - Abnormal; Notable for the following components:  ? Bacteria, UA MANY (*)   ? All other components within normal limits  ?GC/CHLAMYDIA PROBE AMP ([place]) NOT AT [facility]  ? ? ?EKG ?None ? ?Radiology ?No results found. ? ?Procedures ?Procedures  ? ? ?Medications Ordered in ED ?Medications - No data to display ? ?ED Course/ Medical Decision Making/ A&P ?  ?                        ?Medical Decision Making ?Amount and/or Complexity of Data Reviewed ?Labs: ordered. ? ?Risk ?Prescription drug management. ? ? ?This patient presents to the ED for concern of lower abd pain. Differential includes but is not limited to vaginal infection, UTI/cystitis, pyelonephritis, pregnancy, ovarian torsion, ovarian cyst ?  ?This is not an exhaustive differential.  ?  ?Past Medical History / Co-morbidities / Social History: ?Frequent UTI ? ?  ?Physical Exam: ?Physical exam performed. The pertinent findings include: Suprapubic/right inguinal tenderness ? ?Lab Tests: ?I ordered, and personally interpreted labs.  The pertinent results include: UA with infection ?  ?Imaging Studies: ?Low suspicion pelvic abnormality.  Imaging deferred ? ? ?Disposition: ?Patient has a urinary tract infection.  She suspected this.  Will treat with Macrobid and follow-up with PCP as needed.  She already has Diflucan from urgent care in the event that she gets a UTI.  We will follow up on GC results online. ? ?I discussed this case with my attending physician Dr. Pearline Cables who cosigned this note including patient's presenting symptoms, physical exam, and planned  diagnostics and interventions. Attending physician stated agreement with plan or made changes to plan which were implemented.    ? ?Final Clinical Impression(s) / ED Diagnoses ?Final diagnoses:  ?Lower urinary tract infectious disease  ? ? ?Rx / DC Orders ?ED Discharge Orders   ? ?      Ordered  ?  nitrofurantoin, macrocrystal-monohydrate, (MACROBID) 100 MG capsule  2 times daily       ? 09/01/21 1138  ? ?  ?  ? ?  ? ?Results and diagnoses were explained to the patient. Return precautions discussed in full. Patient had no additional questions and expressed complete understanding. ? ? ?  This chart was dictated using voice recognition software.  Despite best efforts to proofread,  errors can occur which can change the documentation meaning.  ?  ?Rhae Hammock, PA-C ?09/01/21 1146 ? ?  ?Jeanell Sparrow, DO ?09/02/21 1226 ? ?

## 2021-09-01 NOTE — Discharge Instructions (Addendum)
Ibuprofen and naproxen are good options for pain.  Only take 1 at a time because taking both can increase your risk for stomach erosion and ulcers. ? ?Follow-up with your PCP if you continue to have symptoms in a week. ?

## 2021-09-03 ENCOUNTER — Other Ambulatory Visit: Payer: Self-pay

## 2021-09-03 ENCOUNTER — Encounter (HOSPITAL_BASED_OUTPATIENT_CLINIC_OR_DEPARTMENT_OTHER): Payer: Self-pay | Admitting: Emergency Medicine

## 2021-09-03 ENCOUNTER — Emergency Department (HOSPITAL_BASED_OUTPATIENT_CLINIC_OR_DEPARTMENT_OTHER)
Admission: EM | Admit: 2021-09-03 | Discharge: 2021-09-03 | Discharge status: Against Medical Advice | Payer: 59 | Attending: Emergency Medicine | Admitting: Emergency Medicine

## 2021-09-03 DIAGNOSIS — R45851 Suicidal ideations: Secondary | ICD-10-CM | POA: Diagnosis present

## 2021-09-03 DIAGNOSIS — F32A Depression, unspecified: Secondary | ICD-10-CM | POA: Insufficient documentation

## 2021-09-03 DIAGNOSIS — F99 Mental disorder, not otherwise specified: Secondary | ICD-10-CM | POA: Insufficient documentation

## 2021-09-03 NOTE — ED Notes (Signed)
Pt walked out of ED after being asked to change into BH scrubs. EDP informed. No new orders.  ?

## 2021-09-03 NOTE — ED Provider Notes (Signed)
?[facility] EMERGENCY DEPARTMENT ?Provider Note ? ? ?CSN: [id number] ?Arrival date & time: 09/03/21  1641 ? ?  ? ?History ? ?Chief Complaint  ?Patient presents with  ? Suicidal  ? ? ?Karen Patrick is a 56 y.o. female.  She has a history of mental health issues.  She has been under increased stressors and recently is having difficulty coping.  She had suicidal thoughts today.  She has been using marijuana to help cope which is not helping.  She denies any acute medical complaints.  She is here yesterday for urinary complaints and was put on antibiotics.  Denies any acute medical issues. ? ?The history is provided by the patient.  ?Mental Health Problem ?Presenting symptoms: depression and suicidal thoughts   ?Onset quality:  Gradual ?Context: stressful life event   ?Worsened by:  Family interactions ?Ineffective treatments:  None tried ?Associated symptoms: no abdominal pain, no chest pain and no headaches   ?Risk factors: hx of mental illness   ? ?  ? ?Home Medications ?Prior to Admission medications   ?Medication Sig Start Date End Date Taking? Authorizing Provider  ?ascorbic acid (VITAMIN C) 500 MG tablet Take 500 mg by mouth daily.    [provider]  ?atomoxetine (STRATTERA) 60 MG capsule Take 1 capsule (60 mg total) by mouth daily. 08/25/21   Sharlene Dory, DO  ?atorvastatin (LIPITOR) 20 MG tablet TAKE 1 TABLET BY MOUTH DAILY 08/21/21   Sharlene Dory, DO  ?carvedilol (COREG) 12.5 MG tablet TAKE ONE TABLET BY MOUTH TWICE A DAY WITH MEALS 05/23/21   Tobb, Lavona Mound, DO  ?Cranberry 400 MG TABS Take 400 mg by mouth daily.    [provider]  ?ENTRESTO 24-26 MG TAKE ONE TABLET BY MOUTH TWICE A DAY 11/09/20   Tobb, Kardie, DO  ?FARXIGA 10 MG TABS tablet TAKE ONE TABLET BY MOUTH DAILY BEFORE BREAKFAST 02/20/21   Sharlene Dory, DO  ?Krill Oil 1000 MG CAPS Take 1,000 mg by mouth daily.    [provider]  ?Multiple Vitamin (MULTIVITAMIN) capsule Take 1 capsule by  mouth daily.    [provider]  ?nitrofurantoin, macrocrystal-monohydrate, (MACROBID) 100 MG capsule Take 1 capsule (100 mg total) by mouth 2 (two) times daily. 09/01/21   Redwine, Madison A, PA-C  ?nitroGLYCERIN (NITROSTAT) 0.4 MG SL tablet Place 1 tablet (0.4 mg total) under the tongue every 5 (five) minutes as needed for chest pain. 02/02/20 02/23/21  Thomasene Ripple, DO  ?OZEMPIC, 1 MG/DOSE, 4 MG/3ML SOPN INJECT 1 MG INTO THE SKIN ONCE A WEEK 04/21/21   Sharlene Dory, DO  ?spironolactone (ALDACTONE) 25 MG tablet TAKE ONE TABLET BY MOUTH DAILY 08/21/21   Sharlene Dory, DO  ?torsemide (DEMADEX) 20 MG tablet TAKE ONE TABLET BY MOUTH DAILY 06/23/21   Sharlene Dory, DO  ?   ? ?Allergies    ?Sulfa antibiotics   ? ?Review of Systems   ?Review of Systems  ?Constitutional:  Negative for fever.  ?HENT:  Negative for sore throat.   ?Respiratory:  Negative for shortness of breath.   ?Cardiovascular:  Negative for chest pain.  ?Gastrointestinal:  Negative for abdominal pain.  ?Genitourinary:  Negative for dysuria.  ?Skin:  Negative for rash.  ?Neurological:  Negative for headaches.  ?Psychiatric/Behavioral:  Positive for suicidal ideas.   ? ?Physical Exam ?Updated Vital Signs ?BP (!) 141/74 (BP Location: Right Arm)   Pulse 88   Temp 98.2 ?F (36.8 ?C) (Oral)   Resp 16  Ht 5\' 3"  (1.6 m)   Wt 69.4 kg   SpO2 100%   BMI 27.10 kg/m?  ?Physical Exam ?Vitals and nursing note reviewed.  ?Constitutional:   ?   General: She is not in acute distress. ?   Appearance: Normal appearance. She is well-developed.  ?HENT:  ?   Head: Normocephalic and atraumatic.  ?Eyes:  ?   Conjunctiva/sclera: Conjunctivae normal.  ?Cardiovascular:  ?   Rate and Rhythm: Normal rate and regular rhythm.  ?   Heart sounds: No murmur heard. ?Pulmonary:  ?   Effort: Pulmonary effort is normal. No respiratory distress.  ?   Breath sounds: Normal breath sounds.  ?Abdominal:  ?   Palpations: Abdomen is soft.  ?   Tenderness:  There is no abdominal tenderness.  ?Musculoskeletal:     ?   General: No swelling.  ?   Cervical back: Neck supple.  ?Skin: ?   General: Skin is warm and dry.  ?   Capillary Refill: Capillary refill takes less than 2 seconds.  ?Neurological:  ?   General: No focal deficit present.  ?   Mental Status: She is alert.  ?Psychiatric:  ?   Comments: Patient is awake and alert.  She is tearful.  She is having passive suicidal thoughts.  She is not sure if she wants to sign in for full evaluation.  I recommended that she come in so we can have psychiatry talk to her.  ? ? ?ED Results / Procedures / Treatments   ?Labs ?(all labs ordered are listed, but only abnormal results are displayed) ?Labs Reviewed - No data to display ? ? ?EKG ?None ? ?Radiology ?No results found. ? ?Procedures ?Procedures  ? ? ?Medications Ordered in ED ?Medications - No data to display ? ?ED Course/ Medical Decision Making/ A&P ?Clinical Course as of 09/04/21 1047  ?11/04/21 Sep 03, 2021  ?1710 Patient decided against staying for medical evaluation and psychiatric evaluation.  With my discussion with her I do not feel warrants the level of an IVC at this time. [MB]  ?  ?Clinical Course User Index ?[MB] 1711, MD  ? ?                        ?Medical Decision Making ?Amount and/or Complexity of Data Reviewed ?Labs: ordered. ? ? ? ? ? ? ? ? ? ? ?Final Clinical Impression(s) / ED Diagnoses ?Final diagnoses:  ?Suicidal thoughts  ? ? ?Rx / DC Orders ?ED Discharge Orders   ? ? None  ? ?  ? ? ?  ?Terrilee Files, MD ?09/04/21 1048 ? ?

## 2021-09-03 NOTE — ED Triage Notes (Signed)
Pt arrives pov, steady gait to triage with endorsement of S/I today. Reports personal and work changes, "had a fleeting thought when I came home from work today". ?

## 2021-09-03 NOTE — ED Notes (Signed)
Pt refused to adhere to protocols for Behavioral Health evaluation (she did not want to place her phone phone with her belongings at the desk); she walked out before being evaluated; EDP made aware. ?

## 2021-09-04 LAB — GC/CHLAMYDIA PROBE AMP (~~LOC~~) NOT AT ARMC
Chlamydia: NEGATIVE
Comment: NEGATIVE
Comment: NORMAL
Neisseria Gonorrhea: NEGATIVE

## 2021-09-08 ENCOUNTER — Telehealth: Payer: 59 | Admitting: Physician Assistant

## 2021-09-08 DIAGNOSIS — N390 Urinary tract infection, site not specified: Secondary | ICD-10-CM

## 2021-09-08 MED ORDER — CIPROFLOXACIN HCL 500 MG PO TABS: 500.0000 mg | ORAL_TABLET | Freq: Two times a day (BID) | ORAL | 0 refills | Status: AC

## 2021-09-08 NOTE — Progress Notes (Signed)
?Virtual Visit Consent  ? ?Karen Patrick, you are scheduled for a virtual visit with a [facility] provider today. Just as with appointments in the office, your consent must be obtained to participate. Your consent will be active for this visit and any virtual visit you may have with one of our providers in the next 365 days. If you have a MyChart account, a copy of this consent can be sent to you electronically. ? ?As this is a virtual visit, video technology does not allow for your provider to perform a traditional examination. This may limit your provider's ability to fully assess your condition. If your provider identifies any concerns that need to be evaluated in person or the need to arrange testing (such as labs, EKG, etc.), we will make arrangements to do so. Although advances in technology are sophisticated, we cannot ensure that it will always work on either your end or our end. If the connection with a video visit is poor, the visit may have to be switched to a telephone visit. With either a video or telephone visit, we are not always able to ensure that we have a secure connection. ? ?By engaging in this virtual visit, you consent to the provision of healthcare and authorize for your insurance to be billed (if applicable) for the services provided during this visit. Depending on your insurance coverage, you may receive a charge related to this service. ? ?I need to obtain your verbal consent now. Are you willing to proceed with your visit today? Karen Patrick has provided verbal consent on 09/08/2021 for a virtual visit (video or telephone). Margaretann Loveless, PA-C ? ?Date: 09/08/2021 12:35 PM ? ?Virtual Visit via Video Note  ? ?Karen Patrick, connected with  Batsheva Stevick  ([id number], 09/10/1965) on 09/08/21 at 12:30 PM EDT by a video-enabled telemedicine application and verified that I am speaking with the correct person using two identifiers. ? ?Location: ?Patient: Virtual Visit Location Patient:  Home ?Provider: Virtual Visit Location Provider: Home Office ?  ?I discussed the limitations of evaluation and management by telemedicine and the availability of in person appointments. The patient expressed understanding and agreed to proceed.   ? ?History of Present Illness: ?Karen Patrick is a 56 y.o. who identifies as a female who was assigned female at birth, and is being seen today for possible recurrent UTI. ? ?HPI: Urinary Tract Infection  ?This is a recurrent problem. The current episode started yesterday. The problem occurs every urination. The problem has been gradually worsening. The quality of the pain is described as burning and aching. The pain is moderate. There has been no fever. Associated symptoms include frequency, hesitancy and urgency. Pertinent negatives include no flank pain or hematuria. She has tried antibiotics (macrobid) for the symptoms. The treatment provided no relief. Her past medical history is significant for recurrent UTIs.  Seen in the ER on 09/01/21 and treated for a UTI with Macrobid. She had improvement in symptoms, but they all returned last night. Upon chart review a urine culture was not obtained. ? ? ?Problems:  ?Patient Active Problem List  ? Diagnosis Date Noted  ? Myocardial infarction [facility])   ? GERD (gastroesophageal reflux disease)   ? Depression   ? Asthma   ? Ankle fracture, right   ? Acute systolic congestive heart failure (HCC)   ? Nonischemic cardiomyopathy (HCC) 08/26/2019  ? Mixed hyperlipidemia 08/26/2019  ? Obesity (BMI 30-39.9) 08/26/2019  ? PAT (paroxysmal atrial tachycardia) (HCC) 07/03/2019  ?  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 NSVT (nonsustained ventricular tachycardia) (HCC) 07/03/2019  ? Other hyperlipidemia 06/06/2018  ? 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 Cardiomyopathy (HCC) 11/06/2017  ? Obstructive sleep apnea 06/26/2017  ? Midline thoracic back pain 03/06/2017  ? Essential hypertension 03/05/2017  ? History of  hypertension 03/05/2017  ? Adjustment disorder with depressed mood 06/03/2016  ? Drug overdose   ? Suicidal ideation   ? Fracture of right orbital floor with routine healing 10/12/2014  ? Urinary tract infection 01/07/2014  ? UTI (urinary tract infection), bacterial 01/07/2014  ? Acute pancreatitis 01/06/2014  ? Seasonal allergies 03/30/2013  ? Other seasonal allergic rhinitis 03/30/2013  ? Type 2 diabetes mellitus with hyperglycemia, with long-term current use of insulin (HCC) 02/26/2013  ? Type 2 diabetes mellitus without complications (HCC) 02/26/2013  ? Schizoaffective disorder (HCC) 06/04/2012  ? Injury of right ankle 11/02/2010  ? Right ankle injury 11/02/2010  ?  ?Allergies:  ?Allergies  ?Allergen Reactions  ? Sulfa Antibiotics   ? ?Medications:  ?Current Outpatient Medications:  ?  ciprofloxacin (CIPRO) 500 MG tablet, Take 1 tablet (500 mg total) by mouth 2 (two) times daily for 5 days., Disp: 10 tablet, Rfl: 0 ?  ascorbic acid (VITAMIN C) 500 MG tablet, Take 500 mg by mouth daily., Disp: , Rfl:  ?  atomoxetine (STRATTERA) 60 MG capsule, Take 1 capsule (60 mg total) by mouth daily., Disp: 30 capsule, Rfl: 2 ?  atorvastatin (LIPITOR) 20 MG tablet, TAKE 1 TABLET BY MOUTH DAILY, Disp: 30 tablet, Rfl: 11 ?  carvedilol (COREG) 12.5 MG tablet, TAKE ONE TABLET BY MOUTH TWICE A DAY WITH MEALS, Disp: 180 tablet, Rfl: 3 ?  Cranberry 400 MG TABS, Take 400 mg by mouth daily., Disp: , Rfl:  ?  ENTRESTO 24-26 MG, TAKE ONE TABLET BY MOUTH TWICE A DAY, Disp: 180 tablet, Rfl: 1 ?  FARXIGA 10 MG TABS tablet, TAKE ONE TABLET BY MOUTH DAILY BEFORE BREAKFAST, Disp: 30 tablet, Rfl: 3 ?  Krill Oil 1000 MG CAPS, Take 1,000 mg by mouth daily., Disp: , Rfl:  ?  Multiple Vitamin (MULTIVITAMIN) capsule, Take 1 capsule by mouth daily., Disp: , Rfl:  ?  nitrofurantoin, macrocrystal-monohydrate, (MACROBID) 100 MG capsule, Take 1 capsule (100 mg total) by mouth 2 (two) times daily., Disp: 10 capsule, Rfl: 0 ?  nitroGLYCERIN (NITROSTAT) 0.4  MG SL tablet, Place 1 tablet (0.4 mg total) under the tongue every 5 (five) minutes as needed for chest pain., Disp: 90 tablet, Rfl: 3 ?  OZEMPIC, 1 MG/DOSE, 4 MG/3ML SOPN, INJECT 1 MG INTO THE SKIN ONCE A WEEK, Disp: 3 mL, Rfl: 2 ?  spironolactone (ALDACTONE) 25 MG tablet, TAKE ONE TABLET BY MOUTH DAILY, Disp: 30 tablet, Rfl: 0 ?  torsemide (DEMADEX) 20 MG tablet, TAKE ONE TABLET BY MOUTH DAILY, Disp: 30 tablet, Rfl: 3 ? ?Observations/Objective: ?Patient is well-developed, well-nourished in no acute distress.  ?Resting comfortably at home.  ?Head is normocephalic, atraumatic.  ?No labored breathing.  ?Speech is clear and coherent with logical content.  ?Patient is alert and oriented at baseline.  ? ? ?Assessment and Plan: ?1. Recurrent UTI ?- ciprofloxacin (CIPRO) 500 MG tablet; Take 1 tablet (500 mg total) by mouth 2 (two) times daily for 5 days.  Dispense: 10 tablet; Refill: 0 ? ?- Worsening symptoms.  ?- Will treat empirically with Ciprofloxacin ?- May use AZO for bladder spasms ?- Continue to push fluids.  ?- Seek in person evaluation for urine  culture if symptoms do not improve or if they worsen.  ? ? ?Follow Up Instructions: ?I discussed the assessment and treatment plan with the patient. The patient was provided an opportunity to ask questions and all were answered. The patient agreed with the plan and demonstrated an understanding of the instructions.  A copy of instructions were sent to the patient via MyChart unless otherwise noted below.  ? ? ?The patient was advised to call back or seek an in-person evaluation if the symptoms worsen or if the condition fails to improve as anticipated. ? ?Time:  ?I spent 8 minutes with the patient via telehealth technology discussing the above problems/concerns.   ? ?Margaretann Loveless, PA-C ?

## 2021-09-08 NOTE — Patient Instructions (Signed)
?[name], thank you for joining Margaretann Loveless, PA-C for today's virtual visit.  While this provider is not your primary care provider (PCP), if your PCP is located in our provider database this encounter information will be shared with them immediately following your visit. ? ?Consent: ?(Patient) Karen Patrick provided verbal consent for this virtual visit at the beginning of the encounter. ? ?Current Medications: ? ?Current Outpatient Medications:  ?  ciprofloxacin (CIPRO) 500 MG tablet, Take 1 tablet (500 mg total) by mouth 2 (two) times daily for 5 days., Disp: 10 tablet, Rfl: 0 ?  ascorbic acid (VITAMIN C) 500 MG tablet, Take 500 mg by mouth daily., Disp: , Rfl:  ?  atomoxetine (STRATTERA) 60 MG capsule, Take 1 capsule (60 mg total) by mouth daily., Disp: 30 capsule, Rfl: 2 ?  atorvastatin (LIPITOR) 20 MG tablet, TAKE 1 TABLET BY MOUTH DAILY, Disp: 30 tablet, Rfl: 11 ?  carvedilol (COREG) 12.5 MG tablet, TAKE ONE TABLET BY MOUTH TWICE A DAY WITH MEALS, Disp: 180 tablet, Rfl: 3 ?  Cranberry 400 MG TABS, Take 400 mg by mouth daily., Disp: , Rfl:  ?  ENTRESTO 24-26 MG, TAKE ONE TABLET BY MOUTH TWICE A DAY, Disp: 180 tablet, Rfl: 1 ?  FARXIGA 10 MG TABS tablet, TAKE ONE TABLET BY MOUTH DAILY BEFORE BREAKFAST, Disp: 30 tablet, Rfl: 3 ?  Krill Oil 1000 MG CAPS, Take 1,000 mg by mouth daily., Disp: , Rfl:  ?  Multiple Vitamin (MULTIVITAMIN) capsule, Take 1 capsule by mouth daily., Disp: , Rfl:  ?  nitrofurantoin, macrocrystal-monohydrate, (MACROBID) 100 MG capsule, Take 1 capsule (100 mg total) by mouth 2 (two) times daily., Disp: 10 capsule, Rfl: 0 ?  nitroGLYCERIN (NITROSTAT) 0.4 MG SL tablet, Place 1 tablet (0.4 mg total) under the tongue every 5 (five) minutes as needed for chest pain., Disp: 90 tablet, Rfl: 3 ?  OZEMPIC, 1 MG/DOSE, 4 MG/3ML SOPN, INJECT 1 MG INTO THE SKIN ONCE A WEEK, Disp: 3 mL, Rfl: 2 ?  spironolactone (ALDACTONE) 25 MG tablet, TAKE ONE TABLET BY MOUTH DAILY, Disp: 30 tablet, Rfl: 0 ?   torsemide (DEMADEX) 20 MG tablet, TAKE ONE TABLET BY MOUTH DAILY, Disp: 30 tablet, Rfl: 3  ? ?Medications ordered in this encounter:  ?Meds ordered this encounter  ?Medications  ? ciprofloxacin (CIPRO) 500 MG tablet  ?  Sig: Take 1 tablet (500 mg total) by mouth 2 (two) times daily for 5 days.  ?  Dispense:  10 tablet  ?  Refill:  0  ?  Order Specific Question:   Supervising Provider  ?  Answer:   Eber Hong [[id number]]  ?  ? ?*If you need refills on other medications prior to your next appointment, please contact your pharmacy* ? ?Follow-Up: ?Call back or seek an in-person evaluation if the symptoms worsen or if the condition fails to improve as anticipated. ? ?Other Instructions ?Urinary Tract Infection, Adult ? ?A urinary tract infection (UTI) is an infection of any part of the urinary tract. The urinary tract includes the kidneys, ureters, bladder, and urethra. These organs make, store, and get rid of urine in the body. ?An upper UTI affects the ureters and kidneys. A lower UTI affects the bladder and urethra. ?What are the causes? ?Most urinary tract infections are caused by bacteria in your genital area around your urethra, where urine leaves your body. These bacteria grow and cause inflammation of your urinary tract. ?What increases the risk? ?You are more likely to develop  this condition if: ?You have a urinary catheter that stays in place. ?You are not able to control when you urinate or have a bowel movement (incontinence). ?You are female and you: ?Use a spermicide or diaphragm for birth control. ?Have low estrogen levels. ?Are pregnant. ?You have certain genes that increase your risk. ?You are sexually active. ?You take antibiotic medicines. ?You have a condition that causes your flow of urine to slow down, such as: ?An enlarged prostate, if you are female. ?Blockage in your urethra. ?A kidney stone. ?A nerve condition that affects your bladder control (neurogenic bladder). ?Not getting enough to drink, or  not urinating often. ?You have certain medical conditions, such as: ?Diabetes. ?A weak disease-fighting system (immunesystem). ?Sickle cell disease. ?Gout. ?Spinal cord injury. ?What are the signs or symptoms? ?Symptoms of this condition include: ?Needing to urinate right away (urgency). ?Frequent urination. This may include small amounts of urine each time you urinate. ?Pain or burning with urination. ?Blood in the urine. ?Urine that smells bad or unusual. ?Trouble urinating. ?Cloudy urine. ?Vaginal discharge, if you are female. ?Pain in the abdomen or the lower back. ?You may also have: ?Vomiting or a decreased appetite. ?Confusion. ?Irritability or tiredness. ?A fever or chills. ?Diarrhea. ?The first symptom in older adults may be confusion. In some cases, they may not have any symptoms until the infection has worsened. ?How is this diagnosed? ?This condition is diagnosed based on your medical history and a physical exam. You may also have other tests, including: ?Urine tests. ?Blood tests. ?Tests for STIs (sexually transmitted infections). ?If you have had more than one UTI, a cystoscopy or imaging studies may be done to determine the cause of the infections. ?How is this treated? ?Treatment for this condition includes: ?Antibiotic medicine. ?Over-the-counter medicines to treat discomfort. ?Drinking enough water to stay hydrated. ?If you have frequent infections or have other conditions such as a kidney stone, you may need to see a health care provider who specializes in the urinary tract (urologist). ?In rare cases, urinary tract infections can cause sepsis. Sepsis is a life-threatening condition that occurs when the body responds to an infection. Sepsis is treated in the hospital with IV antibiotics, fluids, and other medicines. ?Follow these instructions at home: ? ?Medicines ?Take over-the-counter and prescription medicines only as told by your health care provider. ?If you were prescribed an antibiotic  medicine, take it as told by your health care provider. Do not stop using the antibiotic even if you start to feel better. ?General instructions ?Make sure you: ?Empty your bladder often and completely. Do not hold urine for long periods of time. ?Empty your bladder after sex. ?Wipe from front to back after urinating or having a bowel movement if you are female. Use each tissue only one time when you wipe. ?Drink enough fluid to keep your urine pale yellow. ?Keep all follow-up visits. This is important. ?Contact a health care provider if: ?Your symptoms do not get better after 1-2 days. ?Your symptoms go away and then return. ?Get help right away if: ?You have severe pain in your back or your lower abdomen. ?You have a fever or chills. ?You have nausea or vomiting. ?Summary ?A urinary tract infection (UTI) is an infection of any part of the urinary tract, which includes the kidneys, ureters, bladder, and urethra. ?Most urinary tract infections are caused by bacteria in your genital area. ?Treatment for this condition often includes antibiotic medicines. ?If you were prescribed an antibiotic medicine, take it as told  by your health care provider. Do not stop using the antibiotic even if you start to feel better. ?Keep all follow-up visits. This is important. ?This information is not intended to replace advice given to you by your health care provider. Make sure you discuss any questions you have with your health care provider. ?Document Revised: 11/27/2019 Document Reviewed: 11/27/2019 ?Elsevier Patient Education ? 2023 [employer] ? ? ? ?If you have been instructed to have an in-person evaluation today at a local Urgent Care facility, please use the link below. It will take you to a list of all of our available [facility], including address, phone number and hours of operation. Please do not delay care.  ?[facility] ? ?If you or a family member do not have a primary care provider, use  the link below to schedule a visit and establish care. When you choose a [facility] primary care physician or advanced practice provider, you gain a long-term partner in health. ?Find a Primary Care Prov

## 2021-09-21 ENCOUNTER — Other Ambulatory Visit: Payer: Self-pay | Admitting: Family Medicine

## 2021-09-21 ENCOUNTER — Other Ambulatory Visit: Payer: Self-pay | Admitting: Cardiology

## 2021-09-22 ENCOUNTER — Ambulatory Visit (INDEPENDENT_AMBULATORY_CARE_PROVIDER_SITE_OTHER): Payer: 59 | Admitting: Adult Health

## 2021-09-22 ENCOUNTER — Encounter: Payer: Self-pay | Admitting: Adult Health

## 2021-09-22 DIAGNOSIS — G4733 Obstructive sleep apnea (adult) (pediatric): Secondary | ICD-10-CM | POA: Diagnosis not present

## 2021-09-22 NOTE — Progress Notes (Signed)
@Patient  ID: Karen Patrick, female    DOB: [birth year], 56 y.o.   MRN: [id number]  Chief Complaint  Patient presents with   Consult    Referring provider: Shelda Pal*  HPI: 56 year old female seen for sleep consult Sep 22, 2021 to establish for sleep apnea  TEST/EVENTS :   09/22/2021 Sleep consult  Patient presents for sleep consult today.  Kindly referred by primary care provider Dr. Nani Ravens.  Patient says she was diagnosed with sleep apnea in 2019 started on CPAP.  Patient says she wore her CPAP up until October 2022.  Patient says she has been working on weight loss and is down over 60 pounds.  Current weight is 150 with a BMI at 26.  Patient says she stopped having symptoms so she wanted to try off of her CPAP.  She is here today to make sure she still does not have sleep apnea and need a CPAP machine.  Patient typically goes to bed about 10:30 PM takes about 30 minutes to go to sleep.  Is up 2 or 3 times each night.  And gets up about 7 AM.  No symptoms suspicious for cataplexy or sleep paralysis.  Caffeine intake 1 cup of coffee  .  Epworth score is 18 out of 24.  Says hard to tell if her depression or ADHD that causes symptoms.  Does take melatonin to help with sleep .  Has trouble sleeping .  Takes Strattera at bedtime. Started this due to nausea.   Medical history significant for high blood pressure, cardiomyopathy, congestive heart failure, heart attack, diabetes, hyperlipidemia, sleep apnea. GERD , allergies ,. ADHD , Depression   Surgical history tubal ligation 1992, lipoma resection,   Social history.  Patient is married.  Has adult children.  Works as a Management consultant.  Lives at home with her spouse.  Drinks social alcohol.  No smoking.  No drug use.  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   Adjustment disorder with depressed mood 06/03/2016   Ankle fracture, right    Cardiomyopathy ([place]) 11/06/2017   Non-ischemic November 2018   Depression    Diabetes mellitus    Drug overdose    Essential hypertension 03/05/2017   Overview:  Added automatically from request for surgery [phone] Added automatically from request for surgery [phone]   Fracture of right orbital floor with routine healing 10/12/2014   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 Last Assessment & Plan:  R ankle distal fibula avulsion fracture - significantly improved.  Add theraband and balance exercises moving forward (demonstrated today).  No evidence of DVT on exam - feel her pain in lateral calf and deep popliteal region is muscular - discussed warmth, redness, swelling - if occurs to seek care.  ASO for th   Midline thoracic back pain 03/06/2017   Mixed  hyperlipidemia 08/26/2019   Myocardial infarction [facility])    Nonischemic cardiomyopathy (HCC) 08/26/2019   NSVT (nonsustained ventricular tachycardia) (HCC) 07/03/2019   Obesity (BMI 30-39.9) 08/26/2019   Obstructive sleep apnea 06/26/2017   11/2017 PSG AHI: 5.4, REM AHI: 25, SpO2 nadir: 86% 04/2018 PAP PSG: CPAP 6cm  01/2019 started CPAP therapy   Other hyperlipidemia 06/06/2018   Other seasonal allergic rhinitis 03/30/2013   Formatting of this note might be different from the original. Overview:   Watery eyes and sinus pain   PAT (paroxysmal atrial tachycardia) (HCC) 07/03/2019   Right ankle injury 11/02/2010   Schizoaffective disorder (HCC)    Seasonal allergies 03/30/2013   Overview:  Watery eyes and sinus pain   Severe obesity (BMI 35.0-39.9) with comorbidity (HCC) 05/27/2019   Suicidal ideation    Type 2 diabetes mellitus without complications (HCC) 02/26/2013   Urinary tract infection 01/07/2014   UTI (urinary tract infection), bacterial 01/07/2014    Tobacco History: Social History   Tobacco Use  Smoking Status Former   Packs/day: 1.50   Types: Cigarettes   Start date: 1983   Quit date: 2006   Years since quitting: 17.4  Smokeless Tobacco Never   Counseling given: Not Answered   Outpatient Medications Prior to Visit  Medication Sig Dispense Refill   ascorbic acid (VITAMIN C) 500 MG tablet Take 500 mg by mouth daily.     atomoxetine (STRATTERA) 60 MG capsule Take 1 capsule (60 mg total) by mouth daily. 30 capsule 2   atorvastatin (LIPITOR) 20 MG tablet TAKE 1 TABLET BY MOUTH DAILY 30 tablet 11   carvedilol (COREG) 12.5 MG tablet TAKE ONE TABLET BY MOUTH TWICE A DAY WITH MEALS 180 tablet 3   Cranberry 400 MG TABS Take 400 mg by mouth daily.     ENTRESTO 24-26 MG TAKE ONE TABLET BY MOUTH TWICE A DAY 60 tablet 5   FARXIGA 10 MG TABS tablet TAKE ONE TABLET BY MOUTH DAILY BEFORE BREAKFAST 30 tablet 3   Krill Oil 1000 MG CAPS Take 1,000 mg by mouth daily.     metFORMIN (GLUCOPHAGE-XR) 500 MG 24 hr tablet Take 2 tablets (1,000 mg total) by mouth 2 (two) times daily. 120 tablet 0   Multiple Vitamin (MULTIVITAMIN) capsule Take 1 capsule by mouth daily.     OZEMPIC, 1 MG/DOSE, 4 MG/3ML SOPN INJECT 1 MG INTO THE SKIN ONCE A WEEK 3 mL 2   spironolactone (ALDACTONE) 25 MG tablet TAKE ONE TABLET BY MOUTH DAILY 30 tablet 0   torsemide (DEMADEX) 20 MG tablet TAKE ONE TABLET BY MOUTH DAILY 30 tablet 3   nitroGLYCERIN (NITROSTAT) 0.4 MG SL tablet Place 1 tablet (0.4 mg total)  under the tongue every 5 (five) minutes as needed for chest pain. 90 tablet 3   nitrofurantoin, macrocrystal-monohydrate, (MACROBID) 100 MG capsule Take 1 capsule (100 mg total) by mouth 2 (two) times daily. (Patient not taking: Reported on 09/22/2021) 10 capsule 0   No facility-administered medications prior to visit.     Review of Systems:   Constitutional:   No  weight loss, night sweats,  Fevers, chills, fatigue, or  lassitude.  HEENT:   No headaches,  Difficulty swallowing,  Tooth/dental problems, or  Sore throat,                No sneezing, itching, ear ache, nasal congestion, post nasal drip,   CV:  No chest pain,  Orthopnea, PND, swelling in lower extremities, anasarca, dizziness, palpitations, syncope.  GI  No heartburn, indigestion, abdominal pain, nausea, vomiting, diarrhea, change in bowel habits, loss of appetite, bloody stools.   Resp: No shortness of breath with exertion or at rest.  No excess mucus, no productive cough,  No non-productive cough,  No coughing up of blood.  No change in color of mucus.  No wheezing.  No chest wall deformity  Skin: no rash or lesions.  GU: no dysuria, change in color of urine, no urgency or frequency.  No flank pain, no hematuria   MS:  No joint pain or swelling.  No decreased range of motion.  No back pain.    Physical Exam  BP 104/70 (BP Location: Left Arm, Patient Position: Sitting, Cuff Size: Normal)   Pulse 81   Temp 98.2 F (36.8 C) (Oral)   Ht 5\' 3"  (1.6 m)   Wt 150 lb 6.4 oz (68.2 kg)   SpO2 98%   BMI 26.64 kg/m   GEN: A/Ox3; pleasant , NAD, well nourished    HEENT:  [place]/AT,  EACs-clear, TMs-wnl, NOSE-clear, THROAT-clear, no lesions, no postnasal drip or exudate noted.   NECK:  Supple w/ fair ROM; no JVD; normal carotid impulses w/o bruits; no thyromegaly or nodules palpated; no lymphadenopathy.    RESP  Clear  P & A; w/o, wheezes/ rales/ or rhonchi. no accessory muscle use, no dullness to percussion  CARD:  RRR, no  m/r/g, no peripheral edema, pulses intact, no cyanosis or clubbing.  GI:   Soft & nt; nml bowel sounds; no organomegaly or masses detected.   Musco: Warm bil, no deformities or joint swelling noted.   Neuro: alert, no focal deficits noted.    Skin: Warm, no lesions or rashes    Lab Results:  CBC    Component Value Date/Time   WBC 5.6 10/18/2020 2137   RBC 4.36 10/18/2020 2137   HGB 13.1 10/18/2020 2137   HCT 38.8 10/18/2020 2137   PLT 225 10/18/2020 2137   MCV 89.0 10/18/2020 2137   MCH 30.0 10/18/2020 2137   MCHC 33.8 10/18/2020 2137   RDW 12.9 10/18/2020 2137   LYMPHSABS 0.9 10/18/2020 2137   MONOABS 0.5 10/18/2020 2137   EOSABS 0.1 10/18/2020 2137   BASOSABS 0.0 10/18/2020 2137    BMET    Component Value Date/Time   NA 141 08/15/2021 1608   K 4.3 08/15/2021 1608   CL 104 08/15/2021 1608   CO2 23 08/15/2021 1608   GLUCOSE 96 08/15/2021 1608   GLUCOSE 100 (H) 10/18/2020 2137   BUN 17 08/15/2021 1608   CREATININE 0.66 08/15/2021 1608   CREATININE 0.54 02/05/2020 1039   CALCIUM 9.9 08/15/2021 1608   GFRNONAA >60 10/18/2020 2137   GFRAA 108 02/29/2020 0843    BNP    Component Value Date/Time   BNP 10.8 01/25/2019 1908    ProBNP    Component Value Date/Time   PROBNP 73 08/15/2021 1608    Imaging: No results found.        View : No data to display.          No results found for: NITRICOXIDE      Assessment & Plan:   Obstructive sleep apnea History of sleep apnea, daytime sleepiness and snoring.  Suspect patient may have underlying sleep apnea.  She has lost a significant amount of weight.  She does have significant comorbidities including cardiomyopathy, congestive heart failure, MI, diabetes.  Patient will need a repeat sleep study to evaluate for ongoing sleep apnea and  need for restarting CPAP machine if indicated  Plan  Patient Instructions  Set up for in lab sleep study .  Work on healthy weight.  Do not drive if sleepy   Healthy sleep regimen.  Follow up in 6-8 weeks to discuss results and treatment plan if indicated.         Rexene Edison, NP 09/22/2021

## 2021-09-22 NOTE — Assessment & Plan Note (Signed)
History of sleep apnea, daytime sleepiness and snoring.  Suspect patient may have underlying sleep apnea.  She has lost a significant amount of weight.  She does have significant comorbidities including cardiomyopathy, congestive heart failure, MI, diabetes.  Patient will need a repeat sleep study to evaluate for ongoing sleep apnea and need for restarting CPAP machine if indicated  Plan  Patient Instructions  Set up for in lab sleep study .  Work on healthy weight.  Do not drive if sleepy  Healthy sleep regimen.  Follow up in 6-8 weeks to discuss results and treatment plan if indicated.

## 2021-09-22 NOTE — Patient Instructions (Signed)
Set up for in lab sleep study .  Work on healthy weight.  Do not drive if sleepy  Healthy sleep regimen.  Follow up in 6-8 weeks to discuss results and treatment plan if indicated.

## 2021-09-26 ENCOUNTER — Ambulatory Visit: Payer: 59 | Admitting: Family Medicine

## 2021-09-29 ENCOUNTER — Emergency Department (HOSPITAL_BASED_OUTPATIENT_CLINIC_OR_DEPARTMENT_OTHER): Payer: 59

## 2021-09-29 ENCOUNTER — Other Ambulatory Visit: Payer: Self-pay

## 2021-09-29 ENCOUNTER — Encounter (HOSPITAL_BASED_OUTPATIENT_CLINIC_OR_DEPARTMENT_OTHER): Payer: Self-pay | Admitting: Emergency Medicine

## 2021-09-29 ENCOUNTER — Emergency Department (HOSPITAL_BASED_OUTPATIENT_CLINIC_OR_DEPARTMENT_OTHER)
Admission: EM | Admit: 2021-09-29 | Discharge: 2021-09-29 | Disposition: A | Discharge status: Home / Self Care | Payer: 59 | Attending: Emergency Medicine | Admitting: Emergency Medicine

## 2021-09-29 DIAGNOSIS — Z79899 Other long term (current) drug therapy: Secondary | ICD-10-CM | POA: Diagnosis not present

## 2021-09-29 DIAGNOSIS — N939 Abnormal uterine and vaginal bleeding, unspecified: Secondary | ICD-10-CM | POA: Insufficient documentation

## 2021-09-29 DIAGNOSIS — I10 Essential (primary) hypertension: Secondary | ICD-10-CM | POA: Diagnosis not present

## 2021-09-29 DIAGNOSIS — Z7984 Long term (current) use of oral hypoglycemic drugs: Secondary | ICD-10-CM | POA: Insufficient documentation

## 2021-09-29 DIAGNOSIS — R102 Pelvic and perineal pain: Secondary | ICD-10-CM | POA: Diagnosis present

## 2021-09-29 DIAGNOSIS — N760 Acute vaginitis: Secondary | ICD-10-CM

## 2021-09-29 DIAGNOSIS — E119 Type 2 diabetes mellitus without complications: Secondary | ICD-10-CM | POA: Diagnosis not present

## 2021-09-29 DIAGNOSIS — Z711 Person with feared health complaint in whom no diagnosis is made: Secondary | ICD-10-CM

## 2021-09-29 LAB — CBC
HCT: 39.3 % (ref 36.0–46.0)
Hemoglobin: 13.4 g/dL (ref 12.0–15.0)
MCH: 30.2 pg (ref 26.0–34.0)
MCHC: 34.1 g/dL (ref 30.0–36.0)
MCV: 88.5 fL (ref 80.0–100.0)
Platelets: 233 10*3/uL (ref 150–400)
RBC: 4.44 MIL/uL (ref 3.87–5.11)
RDW: 12.9 % (ref 11.5–15.5)
WBC: 6.4 10*3/uL (ref 4.0–10.5)
nRBC: 0 % (ref 0.0–0.2)

## 2021-09-29 LAB — WET PREP, GENITAL
Sperm: NONE SEEN
Trich, Wet Prep: NONE SEEN
WBC, Wet Prep HPF POC: >=10 — AB (ref <10)
Yeast Wet Prep HPF POC: NONE SEEN

## 2021-09-29 LAB — COMPREHENSIVE METABOLIC PANEL
ALT: 16 U/L (ref 0–44)
AST: 16 U/L (ref 15–41)
Albumin: 3.8 g/dL (ref 3.5–5.0)
Alkaline Phosphatase: 64 U/L (ref 38–126)
Anion gap: 6 (ref 5–15)
BUN: 14 mg/dL (ref 6–20)
CO2: 27 mmol/L (ref 22–32)
Calcium: 9 mg/dL (ref 8.9–10.3)
Chloride: 106 mmol/L (ref 98–111)
Creatinine, Ser: 0.51 mg/dL (ref 0.44–1.00)
GFR, Estimated: >60 mL/min (ref >60)
Glucose, Bld: 114 mg/dL — ABNORMAL HIGH (ref 70–99)
Potassium: 3.4 mmol/L — ABNORMAL LOW (ref 3.5–5.1)
Sodium: 139 mmol/L (ref 135–145)
Total Bilirubin: 0.6 mg/dL (ref 0.3–1.2)
Total Protein: 7 g/dL (ref 6.5–8.1)

## 2021-09-29 LAB — URINALYSIS, ROUTINE W REFLEX MICROSCOPIC
Bilirubin Urine: NEGATIVE
Glucose, UA: >=500 mg/dL — AB
Ketones, ur: NEGATIVE mg/dL
Nitrite: NEGATIVE
Protein, ur: NEGATIVE mg/dL
Specific Gravity, Urine: 1.02 (ref 1.005–1.030)
pH: 6 (ref 5.0–8.0)

## 2021-09-29 LAB — LIPASE, BLOOD: Lipase: 40 U/L (ref 11–51)

## 2021-09-29 LAB — PREGNANCY, URINE: Preg Test, Ur: NEGATIVE

## 2021-09-29 LAB — URINALYSIS, MICROSCOPIC (REFLEX)

## 2021-09-29 MED ORDER — DOXYCYCLINE HYCLATE 100 MG PO CAPS
100.0000 mg | ORAL_CAPSULE | Freq: Two times a day (BID) | ORAL | 0 refills | Status: DC
  Filled 2021-09-29: qty 20, 10d supply, fill #0

## 2021-09-29 MED ORDER — IOHEXOL 300 MG/ML  SOLN
100.0000 mL | Freq: Once | INTRAMUSCULAR | Status: AC | PRN
  Administered 2021-09-29: 100 mL via INTRAVENOUS

## 2021-09-29 MED ORDER — MORPHINE SULFATE (PF) 4 MG/ML IV SOLN
4.0000 mg | Freq: Once | INTRAVENOUS | Status: AC
  Administered 2021-09-29: 4 mg via INTRAVENOUS
  Filled 2021-09-29: qty 1

## 2021-09-29 MED ORDER — SODIUM CHLORIDE 0.9 % IV SOLN: INTRAVENOUS | Status: DC | PRN

## 2021-09-29 MED ORDER — METRONIDAZOLE 500 MG PO TABS: 500.0000 mg | ORAL_TABLET | Freq: Two times a day (BID) | ORAL | 0 refills | Status: DC

## 2021-09-29 MED ORDER — SODIUM CHLORIDE 0.9 % IV SOLN
1.0000 g | Freq: Once | INTRAVENOUS | Status: AC
  Administered 2021-09-29: 1 g via INTRAVENOUS
  Filled 2021-09-29: qty 10

## 2021-09-29 MED ORDER — DOXYCYCLINE HYCLATE 100 MG PO CAPS: 100.0000 mg | ORAL_CAPSULE | Freq: Two times a day (BID) | ORAL | 0 refills | Status: DC

## 2021-09-29 NOTE — ED Notes (Signed)
Patient states  she has had a vaginal discharge for three days. States that she has ben treated for uti. States that the discharge is green and has a odor

## 2021-09-29 NOTE — ED Provider Notes (Signed)
Care handoff from Paulita Cradle, PA-C at shift change. Please see their note for further information.  Briefly: Patient presents today with pelvic pain and green vaginal discharge. Originally started on Flagyl with thoughts that she might have BV, completed this without improvement. Returned and was started on cipro with concern for UTI, completed this without improvement as well. She is postmenopausal and sexually active with 1 partner who is her husband.  Plan: Found to have green discharge and CMT on pelvic exam. Wet prep positive for bacterial vaginosis.  GC chlamydia pending.  Ultrasound performed concerning for blood clot versus cervical abscess versus avascular mass.  OB/GYN Dr. Elgie Congo consulted who recommended CT scan.  This is pending at shift change.  CT abdomen and pelvis reveals  There is no evidence of intestinal obstruction or pneumoperitoneum. There is no hydronephrosis. Appendix is not dilated. Few diverticula are seen in colon without signs of focal acute diverticulitis.  There is 2.4 cm fluid density structure in the uterine cervix, possibly prominent nabothian cyst. There are no adnexal masses.  I have personally reviewed and interpreted these images and agree with radiology interpretation.  I rediscussed the patient with OB/GYN Dr. Elgie Congo to clarify likelihood of a nabothian cyst being the cause of her infection.  He states that this is incredibly unlikely.  We will proceed with original plan of treating with Rocephin, Flagyl, and doxycycline to cover GU infections given physical exam findings and wet prep with significant WBCs.  Discussed with with patient who is understanding and amenable with plan.  Patient given Rocephin in the ER this evening and sent home with prescription for doxycycline.  She is stable for discharge at this time with close OB/GYN follow-up.  Educated on red flag symptoms of prompt immediate return, discharged in stable condition.  Findings and plan of  care discussed with supervising physician Dr. Tamera Punt who is in agreement.      Nestor Lewandowsky 09/29/21 2124    Malvin Johns, MD 09/29/21 2256

## 2021-09-29 NOTE — Discharge Instructions (Addendum)
As we discussed, given your symptoms you were prescribed several antibiotics to take for management of a potential pelvic infection.  Cultures of the potential infection were sent and take 24 to 48 hours to result.  You will need to monitor your MyChart online for the results of this.  In the interim, I recommend filling and completing the course of antibiotics regardless given your symptoms.  I also recommend that you call your OB/GYN on Monday to schedule an appointment sometime next week for further evaluation and management of your symptoms.  Return if development of any new or worsening symptoms.

## 2021-09-29 NOTE — ED Provider Notes (Signed)
[facility] Provider Note   CSN: [id number] Arrival date & time: 09/29/21  1438     History PMH: HTN, Diabetes, Obesity, HLD, OSA, nonischemic cardiomyopathy Chief Complaint  Patient presents with   Abdominal Pain   Vaginal Discharge    Karen Patrick is a 56 y.o. female. Presents the emergency department with pelvic pain and vaginal discharge.  States that she has had these recurrent episodes similar to this but this current episode started 2 days ago.  She has had greenish vaginal discharge as well as a small amount of vaginal spotting which is abnormal for her.  She is postmenopausal.  She has had suprapubic pressure and increased urination as well.  She has not had any dysuria.  She denies any fevers, chills, flank pain, nausea, vomiting, diarrhea, constipation. She was seen by her PCP in early May with vaginal discharge and they thought she may have bacterial vaginosis so she was placed on Flagyl.  She did not have a pelvic exam at this time.  He was then seen in our ED on May 5 with similar symptoms of pelvic pain and vaginal discharge that was white.  She self swabbed and had negative STD testing and wet prep.  She was diagnosed with a urinary tract infection and put on bed.  She continued to have symptoms and on May 12 she had a video visit regarding her symptoms.  She was then placed on Cipro for presumed urinary tract infection.  She says this actually helped for a while, but symptoms returned 3 days ago and are considerably worse than they were before. She is sexually active with 1 partner who is her husband.    Abdominal Pain Associated symptoms: vaginal bleeding and vaginal discharge   Associated symptoms: no chills, no constipation, no diarrhea, no dysuria, no fever, no hematuria, no nausea and no vomiting   Vaginal Discharge Associated symptoms: no abdominal pain, no dysuria, no fever, no nausea and no vomiting       Home Medications Prior to  Admission medications   Medication Sig Start Date End Date Taking? Authorizing Provider  ascorbic acid (VITAMIN C) 500 MG tablet Take 500 mg by mouth daily.   Yes [provider]  atomoxetine (STRATTERA) 60 MG capsule Take 1 capsule (60 mg total) by mouth daily. 08/25/21  Yes Shelda Pal, DO  atorvastatin (LIPITOR) 20 MG tablet TAKE 1 TABLET BY MOUTH DAILY 08/21/21  Yes Shelda Pal, DO  carvedilol (COREG) 12.5 MG tablet TAKE ONE TABLET BY MOUTH TWICE A DAY WITH MEALS 05/23/21  Yes Tobb, Kardie, DO  Cranberry 400 MG TABS Take 400 mg by mouth daily.   Yes [provider]  FARXIGA 10 MG TABS tablet TAKE ONE TABLET BY MOUTH DAILY BEFORE BREAKFAST 02/20/21  Yes Wendling, Crosby Oyster, DO  Krill Oil 1000 MG CAPS Take 1,000 mg by mouth daily.   Yes [provider]  metFORMIN (GLUCOPHAGE-XR) 500 MG 24 hr tablet Take 2 tablets (1,000 mg total) by mouth 2 (two) times daily. 09/21/21  Yes Shelda Pal, DO  Multiple Vitamin (MULTIVITAMIN) capsule Take 1 capsule by mouth daily.   Yes [provider]  OZEMPIC, 1 MG/DOSE, 4 MG/3ML SOPN INJECT 1 MG INTO THE SKIN ONCE A WEEK 04/21/21  Yes Shelda Pal, DO  spironolactone (ALDACTONE) 25 MG tablet TAKE ONE TABLET BY MOUTH DAILY 09/21/21  Yes Shelda Pal, DO  torsemide (DEMADEX) 20 MG tablet TAKE ONE TABLET BY MOUTH  DAILY 06/23/21  Yes Shelda Pal, DO  ENTRESTO 24-26 MG TAKE ONE TABLET BY MOUTH TWICE A DAY 09/21/21   Richardo Priest, MD  nitroGLYCERIN (NITROSTAT) 0.4 MG SL tablet Place 1 tablet (0.4 mg total) under the tongue every 5 (five) minutes as needed for chest pain. 02/02/20 02/23/21  Tobb, Godfrey Pick, DO      Allergies    Patient has no known allergies.    Review of Systems   Review of Systems  Constitutional:  Negative for chills and fever.  Gastrointestinal:  Negative for abdominal pain, constipation, diarrhea, nausea and vomiting.  Genitourinary:  Positive  for frequency, pelvic pain, vaginal bleeding and vaginal discharge. Negative for dysuria, flank pain and hematuria.  All other systems reviewed and are negative.  Physical Exam Updated Vital Signs BP 122/75   Pulse 70   Temp 98.1 F (36.7 C) (Oral)   Resp 14   Ht 5\' 3"  (1.6 m)   Wt 68.9 kg   SpO2 99%   BMI 26.93 kg/m  Physical Exam Vitals and nursing note reviewed.  Constitutional:      General: She is not in acute distress.    Appearance: She is well-developed. She is not ill-appearing, toxic-appearing or diaphoretic.  HENT:     Head: Normocephalic and atraumatic.  Eyes:     General: No scleral icterus.       Right eye: No discharge.        Left eye: No discharge.  Pulmonary:     Effort: No respiratory distress.  Abdominal:     Palpations: Abdomen is soft.     Tenderness: There is abdominal tenderness in the suprapubic area and left lower quadrant. There is no right CVA tenderness, left CVA tenderness, guarding or rebound. Negative signs include Murphy's sign, Rovsing's sign and McBurney's sign.     Hernia: No hernia is present.  Genitourinary:    Vagina: Normal.     Cervix: Cervical motion tenderness and discharge present. No friability.     Adnexa:        Right: Tenderness present.      Comments: There is a copious amount of yellow-greenish discharge coming from the cervical os. He also has mild cervical motion tenderness as well as more severe left adnexal tenderness Skin:    General: Skin is warm and dry.  Neurological:     General: No focal deficit present.     Mental Status: She is alert and oriented to person, place, and time.  Psychiatric:        Mood and Affect: Mood normal.        Behavior: Behavior normal.    ED Results / Procedures / Treatments   Labs (all labs ordered are listed, but only abnormal results are displayed) Labs Reviewed  WET PREP, GENITAL - Abnormal; Notable for the following components:      Result Value   Clue Cells Wet Prep HPF POC  PRESENT (*)    WBC, Wet Prep HPF POC >=10 (*)    All other components within normal limits  COMPREHENSIVE METABOLIC PANEL - Abnormal; Notable for the following components:   Potassium 3.4 (*)    Glucose, Bld 114 (*)    All other components within normal limits  URINALYSIS, ROUTINE W REFLEX MICROSCOPIC - Abnormal; Notable for the following components:   APPearance HAZY (*)    Glucose, UA >=500 (*)    Hgb urine dipstick TRACE (*)    Leukocytes,Ua SMALL (*)    All  other components within normal limits  URINALYSIS, MICROSCOPIC (REFLEX) - Abnormal; Notable for the following components:   Bacteria, UA MANY (*)    All other components within normal limits  URINE CULTURE  LIPASE, BLOOD  CBC  PREGNANCY, URINE  GC/CHLAMYDIA PROBE AMP ([place]) NOT AT [facility]    EKG None  Radiology US PELVIC COMPLETE W TRANSVAGINAL AND TORSION R/O  Result Date: 09/29/2021 CLINICAL DATA:  Left adnexal tenderness for 3 days. Green vaginal discharge. Vaginal bleeding. Patient reports history of endometrial ablation. EXAM: TRANSABDOMINAL AND TRANSVAGINAL ULTRASOUND OF PELVIS DOPPLER ULTRASOUND OF OVARIES TECHNIQUE: Both transabdominal and transvaginal ultrasound examinations of the pelvis were performed. Transabdominal technique was performed for global imaging of the pelvis including uterus, ovaries, adnexal regions, and pelvic cul-de-sac. It was necessary to proceed with endovaginal exam following the transabdominal exam to visualize the uterus, ovaries and adnexa. Color and duplex Doppler ultrasound was utilized to evaluate blood flow to the ovaries. COMPARISON:  CT 08/06/2017, pelvic ultrasound 12/02/2012 FINDINGS: Uterus Measurements: 8.2 x 3.5 x 4.3 cm = volume: 65 mL. No fibroids or other mass visualized. Mild myometrial heterogeneity Endometrium Thickness: Approximately 3 mm, although poorly defined due to prior ablation. Within the cervix is a 2 x 1.8 x 2.2 cm heterogeneous structure that has both echogenic  and anechoic components. There is blood flow adjacent but not within the structure. Right ovary Measurements: 2.6 x 1.6 x 1.6 cm = volume: 3.7 mL. Normal quiescent appearance. No cyst or solid lesion. Normal blood flow. No adnexal mass. Left ovary Not visualized.  No adnexal mass. Pulsed Doppler evaluation of the right ovary demonstrates normal low-resistance arterial and venous waveforms. Other findings No abnormal free fluid. IMPRESSION: 1. Avascular 2.2 cm mixed echogenic structure within the cervix is nonspecific. This may represent blood clot in the setting of vaginal bleeding, however given vaginal discharge the possibility of cervical abscess is considered. Avascular mass is not entirely excluded. Recommend correlation with physical exam and direct visualization. 2. Poorly defined endometrium not abnormally thickened at 3 mm, likely due to prior ablation. 3. Normal appearance of the right ovary. Left ovary is not seen on the current exam. Electronically Signed   By: Keith Rake M.D.   On: 09/29/2021 18:08    Procedures Procedures   Medications Ordered in ED Medications  morphine (PF) 4 MG/ML injection 4 mg (has no administration in time range)    ED Course/ Medical Decision Making/ A&P Clinical Course as of 09/29/21 1907  Fri Sep 29, 2021  1827 Patient here with pelvic pain and purulent vaginal discharge. Was seen one month ago for similar less severe symptoms and treated for BV. When symptoms didn't get better, she was treated with Macobid for a UTI bc of a dirty urine. Symptoms did not improve and she was placed on Cipro. She did improve with this briefly but pelvic pain and discharge are now back but worse. Has also had some post menopausal spotting which is abnormal for her. Exam notable for CMT and left adnexal tenderness. Copious purulent discharge noted. Exam seems consistent with PID rather than urinary infection. [place] read concerning for cervical abscess v hematoma v mass.   Consulting OBGYN for recs.  [GL]  1853 - Recommend CT scan    [GL]  1858 Dr. Elgie Congo  [GL]    Clinical Course User Index [GL] Sherre Poot, Adora Fridge, PA-C  Medical Decision Making Amount and/or Complexity of Data Reviewed Labs: ordered. Radiology: ordered.  Risk Prescription drug management.    MDM  This is a 56 y.o. female who presents to the ED with vaginal discharge and pelvic pain The differential of this patient includes but is not limited to Appendicitis, Diverticulitis, Ovarian Torsion, PID/TOA, , UTI, Constipation  My Impression, Plan, and ED Course:  This is a well-appearing female in no acute distress.  She is hemodynamically stable and afebrile.  She does not appear septic. She presents with yellow-green vaginal discharge as well as pelvic pain more prominent in the left lower quadrant.  Pelvic exam is consistent with copious amounts of yellow-green discharge as well as CMT and left adnexal tenderness. She had self swabbed previously for STDs, so we will repeat this today. Unfortunately, no urine cultures have been ordered for patient's presumed UTI. Prior cultures in years past have been notable for e coli, but the antibiotics that she has been on should have covered for this. It sounds like Cipro did give her some partial relief though. She does have some nonspecific symptoms for UTI, but with vaginal discharge, I favor a GU cause to infection. Will obtain pelvic US to evaluate for TOA. Doubt torsion or cyst. Doubt appendicitis or diverticulitis.   I personally ordered, reviewed, and interpreted all laboratory work and imaging and agree with radiologist interpretation. Results interpreted below:  - CBC: no leukocytosis or anemia - CMP: K 3.4, glucose 114, otherwise normal.  - Lipase normal - UA: >500 glucose, small LE, Large bacteria, 21-50 wbc - Sending for urine Culture - Pregnancy negative - Wet prep: + clue cells, > 10 wbc - G/C pending -  Pelvic/Transvaginal US:  IMPRESSION:  1. Avascular 2.2 cm mixed echogenic structure within the cervix is  nonspecific. This may represent blood clot in the setting of vaginal  bleeding, however given vaginal discharge the possibility of  cervical abscess is considered. Avascular mass is not entirely  excluded. Recommend correlation with physical exam and direct  visualization.  2. Poorly defined endometrium not abnormally thickened at 3 mm,  likely due to prior ablation.  3. Normal appearance of the right ovary. Left ovary is not seen on  the current exam.      Patient has tested positive for BV. [place] finding concerning for blood clot versus cervical abscess versus avascular mass. I have consulted OBGYN given my concern for PID. I discussed the case with Dr. Elgie Congo who recommends CT a/p imaging for further evaluation. If concerning abscess or GYN mass present, he recommends reconsulting tonight for further recs. If CT does not reveal any concerning findings, I will probably treat her with Rocephin, Flagyl, and Doxy to prophylactically cover her for GU infections.   7:07 PM Care of Rebeccah Meixsell transferred to [facility] and Dr. Tamera Punt at the end of my shift as the patient will require reassessment once labs/imaging have resulted. Patient presentation, ED course, and plan of care discussed with review of all pertinent labs and imaging. Please see his/her note for further details regarding further ED course and disposition. Plan at time of handoff is f/u on CT scan. Consult GYN accordingly. Dispo appropriately. This may be altered or completely changed at the discretion of the oncoming team pending results of further workup.      Charting Requirements Additional history is obtained from:  Independent historian External Records from outside source obtained and reviewed including: Reviewed prior urine culture, prior STD testing, prior laboratory results  Social Determinants of Health:  none Pertinant PMH  that complicates patient's illness: hx DM  Patient Care Problems that were addressed during this visit: - Pelvic Pain: Acute illness with complication - Vaginal Discharge: Acute illness with complication This patient was maintained on a cardiac monitor/telemetry. I personally viewed and interpreted the cardiac monitor which reveals an underlying rhythm of NSR Medications given in ED: Morphine Reevaluation of the patient after these medicines showed that the patient stayed the same I have reviewed home medications and made changes accordingly.  Critical Care Interventions: n/a Consultations: Consulted Dr. Elgie Congo with OBGYN Disposition: see oncoming provider note  This is a supervised visit with my attending physician, Dr. Tamera Punt. We have discussed this patient and they have altered the plan as needed.  Portions of this note were generated with [employer]. Dictation errors may occur despite best attempts at proofreading.     Final Clinical Impression(s) / ED Diagnoses Final diagnoses:  None    Rx / DC Orders ED Discharge Orders     None         Adolphus Birchwood, PA-C 09/29/21 1907    Malvin Johns, MD 09/29/21 2256

## 2021-09-29 NOTE — ED Triage Notes (Signed)
C/o lower abdominal pain and vaginal discharge; recently seen for same and has had some treatment for BV, yeast infection and urinary infection;

## 2021-09-30 ENCOUNTER — Other Ambulatory Visit (HOSPITAL_BASED_OUTPATIENT_CLINIC_OR_DEPARTMENT_OTHER): Payer: Self-pay

## 2021-10-01 LAB — URINE CULTURE: Culture: <10000 — AB

## 2021-10-02 ENCOUNTER — Encounter (HOSPITAL_BASED_OUTPATIENT_CLINIC_OR_DEPARTMENT_OTHER): Payer: Self-pay | Admitting: Emergency Medicine

## 2021-10-02 LAB — GC/CHLAMYDIA PROBE AMP (~~LOC~~) NOT AT ARMC
Chlamydia: NEGATIVE
Comment: NEGATIVE
Comment: NORMAL
Neisseria Gonorrhea: NEGATIVE

## 2021-10-04 ENCOUNTER — Encounter: Payer: Self-pay | Admitting: Family Medicine

## 2021-10-04 ENCOUNTER — Other Ambulatory Visit (HOSPITAL_BASED_OUTPATIENT_CLINIC_OR_DEPARTMENT_OTHER): Payer: Self-pay

## 2021-10-04 ENCOUNTER — Ambulatory Visit (INDEPENDENT_AMBULATORY_CARE_PROVIDER_SITE_OTHER): Payer: 59 | Admitting: Family Medicine

## 2021-10-04 VITALS — BP 108/71 | HR 56 | Temp 97.9°F | Ht 63.0 in | Wt 155.0 lb

## 2021-10-04 DIAGNOSIS — F900 Attention-deficit hyperactivity disorder, predominantly inattentive type: Secondary | ICD-10-CM

## 2021-10-04 DIAGNOSIS — F339 Major depressive disorder, recurrent, unspecified: Secondary | ICD-10-CM

## 2021-10-04 MED ORDER — GUANFACINE HCL 1 MG PO TABS
1.0000 mg | ORAL_TABLET | Freq: Every day | ORAL | 1 refills | Status: DC
  Filled 2021-10-04: qty 30, 30d supply, fill #0
  Filled 2021-10-29: qty 30, 30d supply, fill #1

## 2021-10-04 MED ORDER — ESCITALOPRAM OXALATE 10 MG PO TABS
10.0000 mg | ORAL_TABLET | Freq: Every day | ORAL | 1 refills | Status: DC
Start: 2021-10-04 — End: 2021-11-06
  Filled 2021-10-04: qty 30, 30d supply, fill #0
  Filled 2021-10-29: qty 30, 30d supply, fill #1

## 2021-10-04 NOTE — Patient Instructions (Addendum)
Stay active.  Aim to do some physical exertion for 150 minutes per week. This is typically divided into 5 days per week, 30 minutes per day. The activity should be enough to get your heart rate up. Anything is better than nothing if you have time constraints.  If you do not hear anything about your referral in the next 1-2 weeks, call our office and ask for an update.  [facility] [address] [place], [place] [address] [phone]  [facility] [address] [place], [place] [address] [phone]  [facility] [address] [place], [place] [address] [phone]  [facility] [address], [place], [place], #[phone] [address], [place], [place], [place]  [facility] [place], [place]  [place] [facility] [place], [place], [place]  [facility] [place], [place]  [facility] in [place] [address], [place], [place].   Please go online to complete a form to submit first.  [facility]  [address], [place], [place].     [facility] -  local practices located at: [address] [place], [place]. [phone]. [place], [place], [place]. [address] [place], [place], [place].  [phone].  Contact one of these offices sooner than later as it can take 2-3 months to get a new patient appointment.

## 2021-10-04 NOTE — Progress Notes (Signed)
Chief Complaint  Patient presents with   Follow-up    Depression     Subjective Karen Patrick presents for f/u anxiety/depression.  Pt is currently being treated with nothing.  Reports doing poorly over past few mo. No thoughts of harming self or others. No self-medication with alcohol, prescription drugs or illicit drugs. Pt is following with a counselor/psychologist.  History of ADHD.  She is hyperactive but has been dealing with trouble focusing over the last several months.  This preceded the depressive symptoms she is currently experiencing.  She had a heart attack several years ago and cannot take any stimulants, Adderall worked the best.  She is currently on Strattera 60 mg daily and that is not helpful for her current situation.  She has failed Wellbutrin in the past.  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   Adjustment disorder with depressed mood 06/03/2016   Ankle fracture, right    Cardiomyopathy (HCC) 11/06/2017   Non-ischemic November 2018   Depression    Diabetes mellitus    Drug overdose    Essential hypertension 03/05/2017   Overview:  Added automatically from request for surgery [phone] Added automatically from request for surgery [phone]   Fracture of right orbital floor with routine healing 10/12/2014   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 Last Assessment & Plan:  R ankle distal fibula avulsion fracture - significantly improved.  Add theraband and balance exercises moving forward (demonstrated today).  No evidence of DVT on exam - feel her pain in lateral calf and deep popliteal region is muscular - discussed warmth, redness, swelling - if  occurs to seek care.  ASO for th   Midline thoracic back pain 03/06/2017   Mixed hyperlipidemia 08/26/2019   Myocardial infarction [facility])    Nonischemic cardiomyopathy (HCC) 08/26/2019   NSVT (nonsustained ventricular tachycardia) (HCC) 07/03/2019   Obesity (BMI 30-39.9) 08/26/2019   Obstructive sleep apnea 06/26/2017   11/2017 PSG AHI: 5.4, REM AHI: 25, SpO2 nadir: 86% 04/2018 PAP PSG: CPAP 6cm  01/2019 started CPAP therapy   Other hyperlipidemia 06/06/2018   Other seasonal allergic rhinitis 03/30/2013   Formatting of this note might be different from the original. Overview:  Watery eyes and sinus pain   PAT (paroxysmal atrial tachycardia) (HCC) 07/03/2019   Right ankle injury 11/02/2010   Schizoaffective disorder (HCC)    Seasonal allergies 03/30/2013   Overview:  Watery eyes and sinus pain   Severe obesity (BMI 35.0-39.9) with comorbidity (HCC) 05/27/2019   Suicidal ideation    Type 2 diabetes mellitus without complications (HCC) 02/26/2013   Urinary tract infection 01/07/2014   UTI (urinary tract infection), bacterial 01/07/2014   Allergies as of 10/04/2021   No Known Allergies      Medication List        Accurate as of October 04, 2021  3:17 PM. If you have any questions, ask your nurse or doctor.          STOP taking these medications    atomoxetine 60 MG capsule Commonly known as: Strattera Stopped by: Sharlene Dory, DO   metFORMIN 500 MG 24 hr tablet Commonly known as: GLUCOPHAGE-XR Stopped by: Sharlene Dory, DO  TAKE these medications    ascorbic acid 500 MG tablet Commonly known as: VITAMIN C Take 500 mg by mouth daily.   atorvastatin 20 MG tablet Commonly known as: LIPITOR TAKE 1 TABLET BY MOUTH DAILY   carvedilol 12.5 MG tablet Commonly known as: COREG TAKE ONE TABLET BY MOUTH TWICE A DAY WITH MEALS   Cranberry 400 MG Tabs Take 400 mg by mouth daily.   doxycycline 100 MG capsule Commonly known as: VIBRAMYCIN Take 1  capsule (100 mg total) by mouth 2 (two) times daily.   Entresto 24-26 MG Generic drug: sacubitril-valsartan TAKE ONE TABLET BY MOUTH TWICE A DAY   escitalopram 10 MG tablet Commonly known as: Lexapro Take 1 tablet (10 mg total) by mouth daily. Started by: Shelda Pal, DO   Farxiga 10 MG Tabs tablet Generic drug: dapagliflozin propanediol TAKE ONE TABLET BY MOUTH DAILY BEFORE BREAKFAST   guanFACINE 1 MG tablet Commonly known as: TENEX Take 1 tablet (1 mg total) by mouth at bedtime. Started by: Shelda Pal, DO   Krill Oil 1000 MG Caps Take 1,000 mg by mouth daily.   metroNIDAZOLE 500 MG tablet Commonly known as: FLAGYL Take 1 tablet (500 mg total) by mouth 2 (two) times daily.   multivitamin capsule Take 1 capsule by mouth daily.   nitroGLYCERIN 0.4 MG SL tablet Commonly known as: NITROSTAT Place 1 tablet (0.4 mg total) under the tongue every 5 (five) minutes as needed for chest pain.   Ozempic (1 MG/DOSE) 4 MG/3ML Sopn Generic drug: Semaglutide (1 MG/DOSE) INJECT 1 MG INTO THE SKIN ONCE A WEEK   spironolactone 25 MG tablet Commonly known as: ALDACTONE TAKE ONE TABLET BY MOUTH DAILY   torsemide 20 MG tablet Commonly known as: DEMADEX TAKE ONE TABLET BY MOUTH DAILY        Exam BP 108/71   Pulse (!) 56   Temp 97.9 F (36.6 C) (Oral)   Ht 5\' 3"  (1.6 m)   Wt 155 lb (70.3 kg)   SpO2 99%   BMI 27.46 kg/m  General:  well developed, well nourished, in no apparent distress Lungs:  No respiratory distress Psych: well oriented with normal range of affect and age-appropriate judgement/insight, alert and oriented x4.  Assessment and Plan  Depression, recurrent ([place]) - Plan: Ambulatory referral to Psychiatry  ADHD (attention deficit hyperactivity disorder), inattentive type - Plan: guanFACINE (TENEX) 1 MG tablet  Chronic, not controlled.  Refer to psychiatry at her request.  We will place psychiatry resources locally in her AVS.  Continue  with counseling.  Start Lexapro 5 mg daily for 2 weeks and then increase to 10 mg daily.  She was on this many years ago and tolerated it well.  She does not remember how well it helped though. Had a history of a heart attack and stopped all stimulants.  She failed Strattera.  She has also failed Wellbutrin.  We will start 1 mg of Tenex nightly. F/u in 1 month to recheck. The patient voiced understanding and agreement to the plan.  Charles, DO 10/04/21 3:17 PM

## 2021-10-05 ENCOUNTER — Ambulatory Visit: Payer: 59 | Admitting: Family Medicine

## 2021-10-26 ENCOUNTER — Ambulatory Visit (HOSPITAL_BASED_OUTPATIENT_CLINIC_OR_DEPARTMENT_OTHER): Payer: 59 | Attending: Adult Health | Admitting: Pulmonary Disease

## 2021-10-26 ENCOUNTER — Other Ambulatory Visit: Payer: Self-pay | Admitting: Family Medicine

## 2021-10-26 DIAGNOSIS — Z7984 Long term (current) use of oral hypoglycemic drugs: Secondary | ICD-10-CM | POA: Insufficient documentation

## 2021-10-26 DIAGNOSIS — G2581 Restless legs syndrome: Secondary | ICD-10-CM | POA: Insufficient documentation

## 2021-10-26 DIAGNOSIS — R0683 Snoring: Secondary | ICD-10-CM | POA: Diagnosis not present

## 2021-10-26 DIAGNOSIS — G4733 Obstructive sleep apnea (adult) (pediatric): Secondary | ICD-10-CM

## 2021-10-26 DIAGNOSIS — Z79899 Other long term (current) drug therapy: Secondary | ICD-10-CM | POA: Diagnosis not present

## 2021-10-26 DIAGNOSIS — R0902 Hypoxemia: Secondary | ICD-10-CM | POA: Insufficient documentation

## 2021-10-27 DIAGNOSIS — G4733 Obstructive sleep apnea (adult) (pediatric): Secondary | ICD-10-CM | POA: Diagnosis not present

## 2021-10-27 NOTE — Procedures (Signed)
Patient Name: Karen Patrick, Karen Patrick Date: 10/26/2021 Gender: Female D.O.B: 30-Nov-1965 Age (years): 85 Referring Provider: Babette Relic Parrett Height (inches): 63 Interpreting Physician: Cyril Mourning MD, ABSM Weight (lbs): 150 RPSGT: Armen Pickup BMI: 27 MRN: [id number] Neck Size: 13.00 <br> <br> CLINICAL INFORMATION Sleep Study Type: NPSG    Indication for sleep study: Snoring, reassessment of known OSa after weight loss    Epworth Sleepiness Score: 17    SLEEP STUDY TECHNIQUE As per the AASM Manual for the Scoring of Sleep and Associated Events v2.3 (April 2016) with a hypopnea requiring 4% desaturations.  The channels recorded and monitored were frontal, central and occipital EEG, electrooculogram (EOG), submentalis EMG (chin), nasal and oral airflow, thoracic and abdominal wall motion, anterior tibialis EMG, snore microphone, electrocardiogram, and pulse oximetry.  MEDICATIONS Medications self-administered by patient taken the night of the study : CARVEDIO, FARXIGA, LEXAPRO, TENEX  SLEEP ARCHITECTURE The study was initiated at 10:05:03 PM and ended at 5:06:10 AM.  Sleep onset time was 39.0 minutes and the sleep efficiency was 76.5%%. The total sleep time was 322 minutes.  Stage REM latency was 135.5 minutes.  The patient spent 6.1%% of the night in stage N1 sleep, 72.8%% in stage N2 sleep, 0.0%% in stage N3 and 21.1% in REM.  Alpha intrusion was absent.  Supine sleep was 23.14%.  RESPIRATORY PARAMETERS The overall apnea/hypopnea index (AHI) was 0.2 per hour. There were 0 total apneas, including 0 obstructive, 0 central and 0 mixed apneas. There were 1 hypopneas and 0 RERAs.  The AHI during Stage REM sleep was 0.9 per hour.  AHI while supine was 0.0 per hour.  The mean oxygen saturation was 91.7%. The minimum SpO2 during sleep was 84.0%.  soft snoring was noted during this study.  CARDIAC DATA The 2 lead EKG demonstrated sinus rhythm. The mean heart rate was 69.0  beats per minute. Other EKG findings include: None.   LEG MOVEMENT DATA The total PLMS were 0 with a resulting PLMS index of 0.0. Associated arousal with leg movement index was 6.3 .  IMPRESSIONS - No significant obstructive sleep apnea occurred during this study (AHI = 0.2/h). - Mild oxygen desaturation was noted during this study (Min O2 = 84.0%). - The patient snored with soft snoring volume. - No cardiac abnormalities were noted during this study. - Clinically significant periodic limb movements did not occur during sleep. Isolated limb movemnts with Associated arousals were significant.   DIAGNOSIS - Nocturnal Hypoxemia (G47.36)   RECOMMENDATIONS - Corelate with clinical history of restless legs syndrome - OSA has improved with weight loss - Avoid alcohol, sedatives and other CNS depressants that may worsen sleep apnea and disrupt normal sleep architecture. - Sleep hygiene should be reviewed to assess factors that may improve sleep quality. - Weight management and regular exercise should be initiated or continued if appropriate.   Cyril Mourning MD Board Certified in Sleep medicine

## 2021-10-30 ENCOUNTER — Other Ambulatory Visit (HOSPITAL_BASED_OUTPATIENT_CLINIC_OR_DEPARTMENT_OTHER): Payer: Self-pay

## 2021-11-01 ENCOUNTER — Encounter: Payer: Self-pay | Admitting: *Deleted

## 2021-11-01 NOTE — Progress Notes (Signed)
ATC x1.  No answer.  VM full.  Mychart message sent.

## 2021-11-02 NOTE — Progress Notes (Signed)
Has f/u with Tammy Parrett NP on 7/24 at 4:30 pm.  Nothing further needed.

## 2021-11-06 ENCOUNTER — Encounter: Payer: Self-pay | Admitting: Family Medicine

## 2021-11-06 ENCOUNTER — Ambulatory Visit (INDEPENDENT_AMBULATORY_CARE_PROVIDER_SITE_OTHER): Payer: 59 | Admitting: Family Medicine

## 2021-11-06 VITALS — BP 94/62 | HR 70 | Temp 98.7°F | Ht 63.0 in | Wt 149.0 lb

## 2021-11-06 DIAGNOSIS — F900 Attention-deficit hyperactivity disorder, predominantly inattentive type: Secondary | ICD-10-CM | POA: Diagnosis not present

## 2021-11-06 DIAGNOSIS — F339 Major depressive disorder, recurrent, unspecified: Secondary | ICD-10-CM | POA: Diagnosis not present

## 2021-11-06 MED ORDER — ESCITALOPRAM OXALATE 10 MG PO TABS
10.0000 mg | ORAL_TABLET | Freq: Every day | ORAL | 7 refills | Status: DC
Start: 2021-11-06 — End: 2021-12-26

## 2021-11-06 NOTE — Progress Notes (Signed)
Chief Complaint  Patient presents with   Follow-up    Medication problems Fatigue     Subjective Karen Patrick presents for f/u anxiety/depression.  Pt is currently being treated with Lexapro 10 mg/d.  Reports doing better since treatment. No thoughts of harming self or others. No self-medication with alcohol, prescription drugs or illicit drugs. Pt is not following with a counselor/psychologist.  ADHD Started on Strattera 60 mg/d. Changed to Tenex 1 mg/night. AE from fatigue since starting. Was not working also. She was on stimulants in the past then had a MI and stopped. She has an appt w psychiatry on 8/12.   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   Adjustment disorder with depressed mood 06/03/2016   Ankle fracture, right    Cardiomyopathy (HCC) 11/06/2017   Non-ischemic November 2018   Depression    Diabetes mellitus    Drug overdose    Essential hypertension 03/05/2017   Overview:  Added automatically from request for surgery [id number] Added automatically from request for surgery [id number]   Fracture of right orbital floor with routine healing 10/12/2014   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 Last Assessment & Plan:  R ankle distal fibula avulsion fracture - significantly improved.  Add theraband and balance exercises moving forward (demonstrated today).  No evidence of DVT on exam - feel her pain in lateral calf and deep popliteal region is muscular - discussed warmth, redness, swelling - if occurs to seek care.  ASO for th   Midline thoracic back pain 03/06/2017   Mixed hyperlipidemia 08/26/2019   Myocardial infarction [facility])    Nonischemic cardiomyopathy  (HCC) 08/26/2019   NSVT (nonsustained ventricular tachycardia) (HCC) 07/03/2019   Obesity (BMI 30-39.9) 08/26/2019   Obstructive sleep apnea 06/26/2017   11/2017 PSG AHI: 5.4, REM AHI: 25, SpO2 nadir: 86% 04/2018 PAP PSG: CPAP 6cm  01/2019 started CPAP therapy   Other hyperlipidemia 06/06/2018   Other seasonal allergic rhinitis 03/30/2013   Formatting of this note might be different from the original. Overview:  Watery eyes and sinus pain   PAT (paroxysmal atrial tachycardia) (HCC) 07/03/2019   Right ankle injury 11/02/2010   Schizoaffective disorder (HCC)    Seasonal allergies 03/30/2013   Overview:  Watery eyes and sinus pain   Severe obesity (BMI 35.0-39.9) with comorbidity (HCC) 05/27/2019   Suicidal ideation    Type 2 diabetes mellitus without complications (HCC) 02/26/2013   Urinary tract infection 01/07/2014   UTI (urinary tract infection), bacterial 01/07/2014   Allergies as of 11/06/2021   No Known Allergies      Medication List        Accurate as of November 06, 2021  2:45 PM. If you have any questions, ask your nurse or doctor.          STOP taking these medications    doxycycline 100 MG capsule Commonly known as: VIBRAMYCIN Stopped by: Sharlene Dory, DO   guanFACINE 1 MG tablet Commonly known as: TENEX Stopped by: Sharlene Dory, DO   metFORMIN 500 MG 24 hr tablet Commonly known as: GLUCOPHAGE-XR Stopped by: Sharlene Dory, DO   metroNIDAZOLE 500 MG tablet Commonly known as: FLAGYL Stopped by: Jilda Roche  Shakeyla Giebler, DO       TAKE these medications    ascorbic acid 500 MG tablet Commonly known as: VITAMIN C Take 500 mg by mouth daily.   atorvastatin 20 MG tablet Commonly known as: LIPITOR TAKE 1 TABLET BY MOUTH DAILY   carvedilol 12.5 MG tablet Commonly known as: COREG TAKE ONE TABLET BY MOUTH TWICE A DAY WITH MEALS   Cranberry 400 MG Tabs Take 400 mg by mouth daily.   Entresto 24-26 MG Generic drug:  sacubitril-valsartan TAKE ONE TABLET BY MOUTH TWICE A DAY   escitalopram 10 MG tablet Commonly known as: Lexapro Take 1 tablet (10 mg total) by mouth daily.   Farxiga 10 MG Tabs tablet Generic drug: dapagliflozin propanediol TAKE ONE TABLET BY MOUTH DAILY BEFORE BREAKFAST   Krill Oil 1000 MG Caps Take 1,000 mg by mouth daily.   multivitamin capsule Take 1 capsule by mouth daily.   nitroGLYCERIN 0.4 MG SL tablet Commonly known as: NITROSTAT Place 1 tablet (0.4 mg total) under the tongue every 5 (five) minutes as needed for chest pain.   Ozempic (1 MG/DOSE) 4 MG/3ML Sopn Generic drug: Semaglutide (1 MG/DOSE) INJECT 1 MG INTO THE SKIN ONCE A WEEK   spironolactone 25 MG tablet Commonly known as: ALDACTONE TAKE 1 TABLET BY MOUTH DAILY   torsemide 20 MG tablet Commonly known as: DEMADEX TAKE ONE TABLET BY MOUTH DAILY        Exam BP 94/62   Pulse 70   Temp 98.7 F (37.1 C) (Oral)   Ht 5\' 3"  (1.6 m)   Wt 149 lb (67.6 kg)   SpO2 99%   BMI 26.39 kg/m  General:  well developed, well nourished, in no apparent distress Lungs:  No respiratory distress Psych: well oriented with normal range of affect and age-appropriate judgement/insight, alert and oriented x4.  Assessment and Plan  Depression, recurrent (HCC)  ADHD (attention deficit hyperactivity disorder), inattentive type  Chronic, stable. Cont Lexapro 10 mg/d. Psych appt next mo. Info provided in her paperwork.  Chronic, unstable, AE to med. Stop Tenex. Cont Strattera as she never stopped. Send message in 2 weeks, may stop this as well.  F/u in 4 mo for CPE or prn. The patient voiced understanding and agreement to the plan.  [place], DO 11/06/21 2:45 PM

## 2021-11-06 NOTE — Patient Instructions (Addendum)
Psychiatry: Dr. Vanetta Shawl [address] [place], [place] [address] [phone]  Stay active.  Stop the guanfacine.   Monitor your blood pressure. If <105/60 consistently still, please let me know.   Let us know if you need anything.

## 2021-11-20 ENCOUNTER — Ambulatory Visit (INDEPENDENT_AMBULATORY_CARE_PROVIDER_SITE_OTHER): Payer: 59 | Admitting: Adult Health

## 2021-11-20 ENCOUNTER — Encounter: Payer: Self-pay | Admitting: Adult Health

## 2021-11-20 VITALS — BP 118/70 | HR 84 | Temp 98.2°F | Ht 63.0 in | Wt 151.8 lb

## 2021-11-20 DIAGNOSIS — G4734 Idiopathic sleep related nonobstructive alveolar hypoventilation: Secondary | ICD-10-CM

## 2021-11-20 DIAGNOSIS — G4733 Obstructive sleep apnea (adult) (pediatric): Secondary | ICD-10-CM | POA: Diagnosis not present

## 2021-11-20 NOTE — Patient Instructions (Signed)
Set up for overnight oximetry test . -will call with test results .  Work on healthy weight.  Do not drive if sleepy  Healthy sleep regimen.  Follow up as needed. If your symptom return call us back for follow up .

## 2021-11-21 NOTE — Progress Notes (Signed)
@Patient  ID: Karen Patrick, female    DOB: [birth year], 56 y.o.   MRN: [id number]  Chief Complaint  Patient presents with   Follow-up    Referring provider: Shelda Pal*  HPI: 56 year old female seen for sleep consult Sep 22, 2021 to establish for sleep apnea. Medical history significant for cardiomyopathy, congestive heart failure, ADHD, diabetes, coronary artery disease  TEST/EVENTS :   11/20/21 Follow up:  Patient returns for a 62-month follow-up.  Patient was seen last visit for sleep consult to establish for sleep apnea.  Patient was previously diagnosed with sleep apnea 2019 started on CPAP.  She wore CPAP for 3 years.  She started working on a weight loss program and has lost a significant amount of weight.  Current weight is at 151 pounds with a BMI at 26.  Patient says in total she has lost about 80 pounds.  Patient wanted to make sure she did not have any further sleep apnea.  Patient says she does have some intermittent troubles going to sleep and has some daytime sleepiness.  Patient does have ADHD and takes Strattera.  Last time we talked about taking this early in the daytime to avoid insomnia Patient was set up for a in lab sleep study.  This showed no significant obstructive sleep apnea.  There was some mild oxygen desaturations with minimum SPO2 low at 84%.  And snoring.  We reviewed her sleep study results.   No Known Allergies  Immunization History  Administered Date(s) Administered   Influenza,inj,Quad PF,6+ Mos 02/05/2020   PFIZER(Purple Top)SARS-COV-2 Vaccination 05/15/2019, 06/05/2019, 03/02/2020   Pneumococcal Polysaccharide-23 11/28/2012   Tdap 08/17/2010   Zoster Recombinat (Shingrix) 03/17/2018, 04/14/2021    Past Medical History:  Diagnosis Date   Acute pancreatitis [id number]   Acute systolic congestive heart failure (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 Last Assessment & Plan:  R ankle distal fibula avulsion fracture - significantly improved.  Add theraband and balance exercises moving forward (demonstrated today).  No evidence of DVT on exam - feel her pain in lateral calf and deep popliteal region is muscular - discussed warmth, redness, swelling - if occurs to seek care.  ASO for th   Midline thoracic back pain 03/06/2017   Mixed hyperlipidemia 08/26/2019   Myocardial infarction [facility])    Nonischemic cardiomyopathy ([place]) 08/26/2019   NSVT (nonsustained ventricular tachycardia) ([place]) 07/03/2019   Obesity (BMI 30-39.9) 08/26/2019   Obstructive sleep apnea 06/26/2017   11/2017 PSG AHI: 5.4, REM AHI: 25, SpO2 nadir: 86% 04/2018 PAP PSG: CPAP 6cm  01/2019 started CPAP therapy   Other hyperlipidemia 06/06/2018   Other seasonal allergic rhinitis 03/30/2013   Formatting of this note might be different from the original. Overview:  Watery eyes and sinus pain   PAT (paroxysmal atrial tachycardia) ([place]) 07/03/2019   Right ankle  injury 11/02/2010   Schizoaffective disorder ([place])    Seasonal allergies 03/30/2013   Overview:  Watery eyes and sinus pain   Severe obesity (BMI 35.0-39.9) with comorbidity ([place]) 05/27/2019   Suicidal ideation    Type 2 diabetes mellitus without complications ([place]) [id number]   Urinary tract infection 01/07/2014   UTI (urinary tract infection),  bacterial 01/07/2014    Tobacco History: Social History   Tobacco Use  Smoking Status Former   Packs/day: 1.50   Types: Cigarettes   Start date: 1983   Quit date: 2006   Years since quitting: 17.5  Smokeless Tobacco Never   Counseling given: Not Answered   Outpatient Medications Prior to Visit  Medication Sig Dispense Refill   ascorbic acid (VITAMIN C) 500 MG tablet Take 500 mg by mouth daily.     atorvastatin (LIPITOR) 20 MG tablet TAKE 1 TABLET BY MOUTH DAILY 30 tablet 11   carvedilol (COREG) 12.5 MG tablet TAKE ONE TABLET BY MOUTH TWICE A DAY WITH MEALS 180 tablet 3   Cranberry 400 MG TABS Take 400 mg by mouth daily.     ENTRESTO 24-26 MG TAKE ONE TABLET BY MOUTH TWICE A DAY 60 tablet 5   escitalopram (LEXAPRO) 10 MG tablet Take 1 tablet (10 mg total) by mouth daily. 30 tablet 7   FARXIGA 10 MG TABS tablet TAKE ONE TABLET BY MOUTH DAILY BEFORE BREAKFAST 30 tablet 3   Krill Oil 1000 MG CAPS Take 1,000 mg by mouth daily.     Multiple Vitamin (MULTIVITAMIN) capsule Take 1 capsule by mouth daily.     OZEMPIC, 1 MG/DOSE, 4 MG/3ML SOPN INJECT 1 MG INTO THE SKIN ONCE A WEEK 3 mL 2   spironolactone (ALDACTONE) 25 MG tablet TAKE 1 TABLET BY MOUTH DAILY 30 tablet 0   torsemide (DEMADEX) 20 MG tablet TAKE ONE TABLET BY MOUTH DAILY 30 tablet 3   nitroGLYCERIN (NITROSTAT) 0.4 MG SL tablet Place 1 tablet (0.4 mg total) under the tongue every 5 (five) minutes as needed for chest pain. 90 tablet 3   No facility-administered medications prior to visit.     Review of Systems:   Constitutional:   No  weight loss, night sweats,  Fevers, chills, fatigue, or  lassitude.  HEENT:   No headaches,  Difficulty swallowing,  Tooth/dental problems, or  Sore throat,                No sneezing, itching, ear ache, nasal congestion, post nasal drip,   CV:  No chest pain,  Orthopnea, PND, swelling in lower extremities, anasarca, dizziness, palpitations, syncope.   GI  No heartburn, indigestion,  abdominal pain, nausea, vomiting, diarrhea, change in bowel habits, loss of appetite, bloody stools.   Resp: No shortness of breath with exertion or at rest.  No excess mucus, no productive cough,  No non-productive cough,  No coughing up of blood.  No change in color of mucus.  No wheezing.  No chest wall deformity  Skin: no rash or lesions.  GU: no dysuria, change in color of urine, no urgency or frequency.  No flank pain, no hematuria   MS:  No joint pain or swelling.  No decreased range of motion.  No back pain.    Physical Exam  BP 118/70 (BP Location: Left Arm, Patient Position: Sitting, Cuff Size: Small)   Pulse 84   Temp 98.2 F (36.8 C) (Oral)   Ht 5\' 3"  (1.6 m)   Wt 151 lb 12.8 oz (68.9  kg)   SpO2 96%   BMI 26.89 kg/m   GEN: A/Ox3; pleasant , NAD, well nourished    HEENT:  [place]/AT,  NOSE-clear, THROAT-clear, no lesions, no postnasal drip or exudate noted.   NECK:  Supple w/ fair ROM; no JVD; normal carotid impulses w/o bruits; no thyromegaly or nodules palpated; no lymphadenopathy.    RESP  Clear  P & A; w/o, wheezes/ rales/ or rhonchi. no accessory muscle use, no dullness to percussion  CARD:  RRR, no m/r/g, no peripheral edema, pulses intact, no cyanosis or clubbing.  GI:   Soft & nt; nml bowel sounds; no organomegaly or masses detected.   Musco: Warm bil, no deformities or joint swelling noted.   Neuro: alert, no focal deficits noted.    Skin: Warm, no lesions or rashes    Lab Results:  CBC    Component Value Date/Time   WBC 6.4 09/29/2021 1507   RBC 4.44 09/29/2021 1507   HGB 13.4 09/29/2021 1507   HCT 39.3 09/29/2021 1507   PLT 233 09/29/2021 1507   MCV 88.5 09/29/2021 1507   MCH 30.2 09/29/2021 1507   MCHC 34.1 09/29/2021 1507   RDW 12.9 09/29/2021 1507   LYMPHSABS 0.9 10/18/2020 2137   MONOABS 0.5 10/18/2020 2137   EOSABS 0.1 10/18/2020 2137   BASOSABS 0.0 10/18/2020 2137    BMET    Component Value Date/Time   NA 139 09/29/2021 1507    NA 141 08/15/2021 1608   K 3.4 (L) 09/29/2021 1507   CL 106 09/29/2021 1507   CO2 27 09/29/2021 1507   GLUCOSE 114 (H) 09/29/2021 1507   BUN 14 09/29/2021 1507   BUN 17 08/15/2021 1608   CREATININE 0.51 09/29/2021 1507   CREATININE 0.54 02/05/2020 1039   CALCIUM 9.0 09/29/2021 1507   GFRNONAA >60 09/29/2021 1507   GFRAA 108 02/29/2020 0843    BNP    Component Value Date/Time   BNP 10.8 01/25/2019 1908    ProBNP    Component Value Date/Time   PROBNP 73 08/15/2021 1608    Imaging: Polysomnography 4 or more parameters (NPSG)  Result Date: 10/26/2021 Oretha Milch, MD     10/27/2021  4:29 PM Patient Name: Karen Patrick Date: 10/26/2021 Gender: Female D.O.B: [birth year] Age (years): 6 Referring Provider: Babette Relic Leena Tiede Height (inches): 63 Interpreting Physician: Cyril Mourning MD, ABSM Weight (lbs): 150 RPSGT: Armen Pickup BMI: 27 MRN: [id number] Neck Size: 13.00 <br> <br> CLINICAL INFORMATION Sleep Study Type: NPSG Indication for sleep study: Snoring, reassessment of known OSa after weight loss Epworth Sleepiness Score: 17 SLEEP STUDY TECHNIQUE As per the AASM Manual for the Scoring of Sleep and Associated Events v2.3 (April 2016) with a hypopnea requiring 4% desaturations. The channels recorded and monitored were frontal, central and occipital EEG, electrooculogram (EOG), submentalis EMG (chin), nasal and oral airflow, thoracic and abdominal wall motion, anterior tibialis EMG, snore microphone, electrocardiogram, and pulse oximetry. MEDICATIONS Medications self-administered by patient taken the night of the study : CARVEDIO, FARXIGA, LEXAPRO, TENEX SLEEP ARCHITECTURE The study was initiated at 10:05:03 PM and ended at 5:06:10 AM. Sleep onset time was 39.0 minutes and the sleep efficiency was 76.5%%. The total sleep time was 322 minutes. Stage REM latency was 135.5 minutes. The patient spent 6.1%% of the night in stage N1 sleep, 72.8%% in stage N2 sleep, 0.0%% in stage N3 and 21.1% in REM.  Alpha intrusion was absent. Supine sleep was 23.14%. RESPIRATORY PARAMETERS The overall apnea/hypopnea index (AHI) was 0.2 per  hour. There were 0 total apneas, including 0 obstructive, 0 central and 0 mixed apneas. There were 1 hypopneas and 0 RERAs. The AHI during Stage REM sleep was 0.9 per hour. AHI while supine was 0.0 per hour. The mean oxygen saturation was 91.7%. The minimum SpO2 during sleep was 84.0%. soft snoring was noted during this study. CARDIAC DATA The 2 lead EKG demonstrated sinus rhythm. The mean heart rate was 69.0 beats per minute. Other EKG findings include: None. LEG MOVEMENT DATA The total PLMS were 0 with a resulting PLMS index of 0.0. Associated arousal with leg movement index was 6.3 . IMPRESSIONS - No significant obstructive sleep apnea occurred during this study (AHI = 0.2/h). - Mild oxygen desaturation was noted during this study (Min O2 = 84.0%). - The patient snored with soft snoring volume. - No cardiac abnormalities were noted during this study. - Clinically significant periodic limb movements did not occur during sleep. Isolated limb movemnts with Associated arousals were significant. DIAGNOSIS - Nocturnal Hypoxemia (G47.36) RECOMMENDATIONS - Corelate with clinical history of restless legs syndrome - OSA has improved with weight loss - Avoid alcohol, sedatives and other CNS depressants that may worsen sleep apnea and disrupt normal sleep architecture. - Sleep hygiene should be reviewed to assess factors that may improve sleep quality. - Weight management and regular exercise should be initiated or continued if appropriate. Cyril Mourning MD Board Certified in Sleep medicine          No data to display          No results found for: "NITRICOXIDE"      Assessment & Plan:   No problem-specific Assessment & Plan notes found for this encounter.     Rubye [place], NP 11/21/2021

## 2021-11-21 NOTE — Assessment & Plan Note (Signed)
Previous diagnosis of sleep apnea.  Patient has had a significant amount of weight loss.  Repeat in lab sleep study shows no significant sleep apnea.  There was some mild nocturnal hypoxemia we will check a overnight oximetry test to make sure she does not have significant nocturnal hypoxemia requiring oxygen. Otherwise no CPAP is indicated at this time.    Discussed healthy sleep regimen. Plan  Patient Instructions  Set up for overnight oximetry test . -will call with test results .  Work on healthy weight.  Do not drive if sleepy  Healthy sleep regimen.  Follow up as needed. If your symptom return call us back for follow up .

## 2021-11-23 ENCOUNTER — Other Ambulatory Visit: Payer: Self-pay | Admitting: Family Medicine

## 2021-11-23 DIAGNOSIS — F900 Attention-deficit hyperactivity disorder, predominantly inattentive type: Secondary | ICD-10-CM

## 2021-11-28 ENCOUNTER — Encounter: Payer: Self-pay | Admitting: Family Medicine

## 2021-11-28 ENCOUNTER — Other Ambulatory Visit: Payer: Self-pay | Admitting: Family Medicine

## 2021-11-30 ENCOUNTER — Encounter: Payer: Self-pay | Admitting: General Practice

## 2021-11-30 NOTE — Progress Notes (Signed)
Reviewed and agree with assessment/plan.   Marjory Meints, MD [facility] 11/30/2021, 9:59 AM Pager:  [phone]  

## 2021-12-05 ENCOUNTER — Other Ambulatory Visit (HOSPITAL_BASED_OUTPATIENT_CLINIC_OR_DEPARTMENT_OTHER): Payer: Self-pay

## 2021-12-05 NOTE — Progress Notes (Deleted)
Psychiatric Initial Adult Assessment   Patient Identification: Karen Patrick MRN:  [id number] Date of Evaluation:  12/05/2021 Referral Source: *** Chief Complaint:  No chief complaint on file.  Visit Diagnosis: No diagnosis found.  History of Present Illness:   Karen Patrick is a 56 y.o. year old female with a history of depression, ADHD, nonischemic cardyocardiomyopathy, NSVT, congestive heart failure, diabetes, sleep apnea, who is referred for depression, ADHD.    Daily routine: Diet:  Exercise: Support: Household:  Marital status: Number of children: Employment:  Education:   Last PCP / ongoing medical evaluation:         Associated Signs/Symptoms: Depression Symptoms:  {DEPRESSION SYMPTOMS:20000} (Hypo) Manic Symptoms:  {BHH MANIC SYMPTOMS:22872} Anxiety Symptoms:  {BHH ANXIETY SYMPTOMS:22873} Psychotic Symptoms:  {BHH PSYCHOTIC SYMPTOMS:22874} PTSD Symptoms: {BHH PTSD SYMPTOMS:22875}  Past Psychiatric History:  Outpatient:  Psychiatry admission:  Previous suicide attempt:  Past trials of medication:  History of violence:    Previous Psychotropic Medications: {YES/NO:21197}  Substance Abuse History in the last 12 months:  {yes no:314532}  Consequences of Substance Abuse: {BHH CONSEQUENCES OF SUBSTANCE ABUSE:22880}  Past Medical History:  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   Adjustment disorder with depressed mood 06/03/2016   Ankle fracture, right    Cardiomyopathy (HCC) 11/06/2017   Non-ischemic November 2018   Depression    Diabetes mellitus    Drug overdose    Essential hypertension 03/05/2017   Overview:  Added automatically from request for surgery [phone] Added automatically from request for surgery [phone]   Fracture of right orbital floor with routine healing 10/12/2014   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 Last Assessment & Plan:  R ankle distal fibula avulsion fracture - significantly improved.  Add theraband and balance exercises moving forward (demonstrated today).  No evidence of DVT on exam - feel her pain in lateral calf and deep popliteal region is muscular - discussed warmth, redness, swelling - if occurs to seek care.  ASO for th   Midline thoracic back pain 03/06/2017   Mixed hyperlipidemia 08/26/2019   Myocardial infarction [facility])    Nonischemic cardiomyopathy (HCC) 08/26/2019   NSVT (nonsustained ventricular tachycardia) (HCC) 07/03/2019   Obesity (BMI 30-39.9) 08/26/2019   Obstructive sleep apnea 06/26/2017   11/2017 PSG AHI: 5.4, REM AHI: 25, SpO2 nadir: 86% 04/2018 PAP PSG: CPAP 6cm  01/2019 started CPAP therapy   Other hyperlipidemia 06/06/2018   Other seasonal allergic rhinitis 03/30/2013   Formatting of this note might be different from the original. Overview:  Watery eyes and sinus pain   PAT (paroxysmal atrial tachycardia) (HCC) 07/03/2019   Right ankle injury 11/02/2010   Schizoaffective disorder (HCC)    Seasonal allergies 03/30/2013   Overview:  Watery eyes and sinus pain   Severe obesity (BMI 35.0-39.9) with comorbidity (HCC) 05/27/2019   Suicidal ideation    Type 2 diabetes mellitus without complications (HCC) 02/26/2013   Urinary tract infection 01/07/2014   UTI (urinary tract infection), bacterial 01/07/2014    Past Surgical History:  Procedure Laterality Date   BACK SURGERY     CESAREAN SECTION     ENDOMETRIAL ABLATION W/ NOVASURE     FOOT FASCIOTOMY  HAMMER TOE SURGERY     KNEE ARTHROCENTESIS     LIPOMA EXCISION      Family Psychiatric History: ***  Family History:  Family History  Problem Relation Age of Onset   Stroke Mother    Diabetes Mother     Hypertension Mother    Cancer Mother    Heart failure Mother    Stroke Father    Sudden death Father    Hypertension Father    Hyperlipidemia Father    Heart attack Father    Diabetes Father    Breast cancer Maternal Aunt    Breast cancer Maternal Aunt    Breast cancer Maternal Aunt    Breast cancer Paternal Aunt    Cancer Paternal Aunt    Diabetes Maternal Grandmother    Heart attack Maternal Grandmother    Hypertension Maternal Grandmother    Diabetes Paternal Grandmother    Sudden death Paternal Grandmother     Social History:   Social History   Socioeconomic History   Marital status: Married    Spouse name: Not on file   Number of children: Not on file   Years of education: Not on file   Highest education level: Not on file  Occupational History   Not on file  Tobacco Use   Smoking status: Former    Packs/day: 1.50    Types: Cigarettes    Start date: 15    Quit date: 2006    Years since quitting: 17.6   Smokeless tobacco: Never  Vaping Use   Vaping Use: Never used  Substance and Sexual Activity   Alcohol use: Yes   Drug use: Yes    Types: Marijuana    Comment: occ   Sexual activity: Yes    Birth control/protection: Surgical  Other Topics Concern   Not on file  Social History Narrative   Not on file   Social Determinants of Health   Financial Resource Strain: Not on file  Food Insecurity: Not on file  Transportation Needs: Not on file  Physical Activity: Not on file  Stress: Not on file  Social Connections: Not on file    Additional Social History: ***  Allergies:  No Known Allergies  Metabolic Disorder Labs: Lab Results  Component Value Date   HGBA1C 6.1 (H) 08/25/2021   MPG 128 08/25/2021   MPG 180 02/05/2020   No results found for: "PROLACTIN" Lab Results  Component Value Date   CHOL 146 08/15/2021   TRIG 100 08/15/2021   HDL 77 08/15/2021   CHOLHDL 1.9 08/15/2021   VLDL 99.3 08/24/2020   LDLCALC 51 08/15/2021   LDLCALC 39  02/23/2021   Lab Results  Component Value Date   TSH 0.982 05/28/2019    Therapeutic Level Labs: No results found for: "LITHIUM" No results found for: "CBMZ" No results found for: "VALPROATE"  Current Medications: Current Outpatient Medications  Medication Sig Dispense Refill   ascorbic acid (VITAMIN C) 500 MG tablet Take 500 mg by mouth daily.     atorvastatin (LIPITOR) 20 MG tablet TAKE 1 TABLET BY MOUTH DAILY 30 tablet 11   carvedilol (COREG) 12.5 MG tablet TAKE ONE TABLET BY MOUTH TWICE A DAY WITH MEALS 180 tablet 3   Cranberry 400 MG TABS Take 400 mg by mouth daily.     ENTRESTO 24-26 MG TAKE ONE TABLET BY MOUTH TWICE A DAY 60 tablet 5   escitalopram (LEXAPRO) 10 MG tablet Take 1 tablet (10 mg total) by mouth daily. 30  tablet 7   FARXIGA 10 MG TABS tablet TAKE ONE TABLET BY MOUTH DAILY BEFORE BREAKFAST 30 tablet 3   Krill Oil 1000 MG CAPS Take 1,000 mg by mouth daily.     Multiple Vitamin (MULTIVITAMIN) capsule Take 1 capsule by mouth daily.     nitroGLYCERIN (NITROSTAT) 0.4 MG SL tablet Place 1 tablet (0.4 mg total) under the tongue every 5 (five) minutes as needed for chest pain. 90 tablet 3   OZEMPIC, 1 MG/DOSE, 4 MG/3ML SOPN INJECT 1 MG INTO THE SKIN ONCE A WEEK 3 mL 2   spironolactone (ALDACTONE) 25 MG tablet TAKE 1 TABLET BY MOUTH DAILY 30 tablet 0   torsemide (DEMADEX) 20 MG tablet TAKE ONE TABLET BY MOUTH DAILY 30 tablet 3   No current facility-administered medications for this visit.    Musculoskeletal: Strength & Muscle Tone:  N/A Gait & Station:  N/A Patient leans: N/A  Psychiatric Specialty Exam: Review of Systems  There were no vitals taken for this visit.There is no height or weight on file to calculate BMI.  General Appearance: {Appearance:22683}  Eye Contact:  {BHH EYE CONTACT:22684}  Speech:  Clear and Coherent  Volume:  Normal  Mood:  {BHH MOOD:22306}  Affect:  {Affect (PAA):22687}  Thought Process:  Coherent  Orientation:  Full (Time, Place, and  Person)  Thought Content:  Logical  Suicidal Thoughts:  {ST/HT (PAA):22692}  Homicidal Thoughts:  {ST/HT (PAA):22692}  Memory:  Immediate;   Good  Judgement:  {Judgement (PAA):22694}  Insight:  {Insight (PAA):22695}  Psychomotor Activity:  Normal  Concentration:  Concentration: Good and Attention Span: Good  Recall:  Good  Fund of Knowledge:Good  Language: Good  Akathisia:  No  Handed:  Right  AIMS (if indicated):  not done  Assets:  Communication Skills Desire for Improvement  ADL's:  Intact  Cognition: WNL  Sleep:  {BHH GOOD/FAIR/POOR:22877}   Screenings: Oceanographer Row Office Visit from 10/04/2021 in [facility] Video Visit from 10/20/2020 in [facility] Office Visit from 02/05/2020 in [facility]  PHQ-2 Total Score 4 0 0  PHQ-9 Total Score 15 -- --      Flowsheet Row ED from 09/29/2021 in [facility] ED from 09/03/2021 in [facility] [facility] [facility] ED from 10/18/2020 in [facility] [facility] [facility]  C-SSRS RISK CATEGORY No Risk Low Risk Error: Question 6 not populated       Assessment and Plan:  Assessment  Plan   The patient demonstrates the following risk factors for suicide: Chronic risk factors for suicide include: {Chronic Risk Factors for SWFUXNA:35573220}. Acute risk factors for suicide include: {Acute Risk Factors for URKYHCW:23762831}. Protective factors for this patient include: {Protective Factors for Suicide DVVO:16073710}. Considering these factors, the overall suicide risk at this point appears to be {Desc; low/moderate/high:110033}. Patient {ACTION; IS/IS GYI:94854627} appropriate for outpatient follow up.   Collaboration of Care: {BH OP Collaboration of Care:21014065}  Patient/Guardian was advised Release of Information must be obtained prior to any record release in  order to collaborate their care with an outside provider. Patient/Guardian was advised if they have not already done so to contact the registration department to sign all necessary forms in order for [place] to release information regarding their care.   Consent: Patient/Guardian gives verbal consent for treatment and assignment of benefits for services provided during this visit. Patient/Guardian expressed understanding  and agreed to proceed.   Neysa Hotter, MD 8/8/20239:09 AM

## 2021-12-07 ENCOUNTER — Telehealth: Payer: Self-pay | Admitting: *Deleted

## 2021-12-07 NOTE — Telephone Encounter (Signed)
ONO results per Rubye Oaks NP:  No desats.  No oxygen needed.

## 2021-12-07 NOTE — Telephone Encounter (Signed)
Called to speak to patient.  No answer.  VM full.

## 2021-12-09 ENCOUNTER — Ambulatory Visit (HOSPITAL_COMMUNITY): Payer: Self-pay | Admitting: Psychiatry

## 2021-12-12 ENCOUNTER — Telehealth: Payer: 59 | Admitting: Physician Assistant

## 2021-12-12 DIAGNOSIS — F339 Major depressive disorder, recurrent, unspecified: Secondary | ICD-10-CM | POA: Diagnosis not present

## 2021-12-12 NOTE — Patient Instructions (Signed)
  Karen Patrick, thank you for joining Karen Meres, PA-C for today's virtual visit.  While this provider is not your primary care provider (PCP), if your PCP is located in our provider database this encounter information will be shared with them immediately following your visit.  Consent: (Patient) Karen Patrick provided verbal consent for this virtual visit at the beginning of the encounter.  Current Medications:  Current Outpatient Medications:    ascorbic acid (VITAMIN C) 500 MG tablet, Take 500 mg by mouth daily., Disp: , Rfl:    atorvastatin (LIPITOR) 20 MG tablet, TAKE 1 TABLET BY MOUTH DAILY, Disp: 30 tablet, Rfl: 11   carvedilol (COREG) 12.5 MG tablet, TAKE ONE TABLET BY MOUTH TWICE A DAY WITH MEALS, Disp: 180 tablet, Rfl: 3   Cranberry 400 MG TABS, Take 400 mg by mouth daily., Disp: , Rfl:    ENTRESTO 24-26 MG, TAKE ONE TABLET BY MOUTH TWICE A DAY, Disp: 60 tablet, Rfl: 5   escitalopram (LEXAPRO) 10 MG tablet, Take 1 tablet (10 mg total) by mouth daily., Disp: 30 tablet, Rfl: 7   FARXIGA 10 MG TABS tablet, TAKE ONE TABLET BY MOUTH DAILY BEFORE BREAKFAST, Disp: 30 tablet, Rfl: 3   Krill Oil 1000 MG CAPS, Take 1,000 mg by mouth daily., Disp: , Rfl:    Multiple Vitamin (MULTIVITAMIN) capsule, Take 1 capsule by mouth daily., Disp: , Rfl:    nitroGLYCERIN (NITROSTAT) 0.4 MG SL tablet, Place 1 tablet (0.4 mg total) under the tongue every 5 (five) minutes as needed for chest pain., Disp: 90 tablet, Rfl: 3   OZEMPIC, 1 MG/DOSE, 4 MG/3ML SOPN, INJECT 1 MG INTO THE SKIN ONCE A WEEK, Disp: 3 mL, Rfl: 2   spironolactone (ALDACTONE) 25 MG tablet, TAKE 1 TABLET BY MOUTH DAILY, Disp: 30 tablet, Rfl: 0   torsemide (DEMADEX) 20 MG tablet, TAKE ONE TABLET BY MOUTH DAILY, Disp: 30 tablet, Rfl: 3   Medications ordered in this encounter:  No orders of the defined types were placed in this encounter.    *If you need refills on other medications prior to your next appointment, please contact your  pharmacy*  Follow-Up: Call back or seek an in-person evaluation if the symptoms worsen or if the condition fails to improve as anticipated.  Other Instructions Please seek immediate evaluation at [facility] at [address], [place], [place] [address]   If you have been instructed to have an in-person evaluation today at a local Urgent Care facility, please use the link below. It will take you to a list of all of our available [facility], including address, phone number and hours of operation. Please do not delay care.  [facility]  If you or a family member do not have a primary care provider, use the link below to schedule a visit and establish care. When you choose a [facility] primary care physician or advanced practice provider, you gain a long-term partner in health. Find a Primary Care Provider  Learn more about [facility]'s in-office and virtual care options: [place] - Get Care Now

## 2021-12-12 NOTE — Progress Notes (Signed)
[name], capshaw are scheduled for a virtual visit with your provider today.    Just as we do with appointments in the office, we must obtain your consent to participate.  Your consent will be active for this visit and any virtual visit you may have with one of our providers in the next 365 days.    If you have a MyChart account, I can also send a copy of this consent to you electronically.  All virtual visits are billed to your insurance company just like a traditional visit in the office.  As this is a virtual visit, video technology does not allow for your provider to perform a traditional examination.  This may limit your provider's ability to fully assess your condition.  If your provider identifies any concerns that need to be evaluated in person or the need to arrange testing such as labs, EKG, etc, we will make arrangements to do so.    Although advances in technology are sophisticated, we cannot ensure that it will always work on either your end or our end.  If the connection with a video visit is poor, we may have to switch to a telephone visit.  With either a video or telephone visit, we are not always able to ensure that we have a secure connection.   I need to obtain your verbal consent now.   Are you willing to proceed with your visit today?   Alfie Alderfer has provided verbal consent on 12/12/2021 for a virtual visit (video or telephone).   Karrie Meres, PA-C 12/12/2021  7:12 PM   Date:  12/12/2021   ID:  Karen Patrick, DOB 1966/02/20, MRN [id number]  Patient Location: Home Provider Location: Home Office   Participants: Patient and Provider for Visit and Wrap up  Method of visit: Video  Location of Patient: Home Location of Provider: Home Office Consent was obtain for visit over the video. Services rendered by provider: Visit was performed via video  A video enabled telemedicine application was used and I verified that I am speaking with the correct person using two  identifiers.  PCP:  Sharlene Dory, DO   Chief Complaint:  mental health visit  History of Present Illness:    Karen Patrick is a 56 y.o. female with history as stated below. Presents video telehealth for an acute care visit    She has been feeling very depressed and feels like she does not have a will to live.  She reports she has been having passive suicidal ideations stating that if the storm took her out today she would not be upset. She has no specific plan. She has felt this way for about 3 days and had a particularly hard day today.   She states she has not been taking the lexapro for the last 5-6 days as she did not realize she had any refills.  States that she missed her psychiatry appt 12/09/2021  Past Medical, Surgical, Social History, Allergies, and Medications have been Reviewed.  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   Adjustment disorder with depressed mood 06/03/2016   Ankle fracture, right    Cardiomyopathy (HCC) 11/06/2017   Non-ischemic November 2018   Depression    Diabetes mellitus    Drug overdose    Essential hypertension 03/05/2017   Overview:  Added automatically from request for surgery [phone]  Added automatically from request for surgery [phone]   Fracture of right orbital floor with routine healing 10/12/2014   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 Last Assessment & Plan:  R ankle distal fibula avulsion fracture - significantly improved.  Add theraband and balance exercises moving forward (demonstrated today).  No evidence of DVT on exam - feel her pain in lateral calf and deep popliteal region is muscular - discussed warmth,  redness, swelling - if occurs to seek care.  ASO for th   Midline thoracic back pain 03/06/2017   Mixed hyperlipidemia 08/26/2019   Myocardial infarction [facility])    Nonischemic cardiomyopathy (HCC) 08/26/2019   NSVT (nonsustained ventricular tachycardia) (HCC) 07/03/2019   Obesity (BMI 30-39.9) 08/26/2019   Obstructive sleep apnea 06/26/2017   11/2017 PSG AHI: 5.4, REM AHI: 25, SpO2 nadir: 86% 04/2018 PAP PSG: CPAP 6cm  01/2019 started CPAP therapy   Other hyperlipidemia 06/06/2018   Other seasonal allergic rhinitis 03/30/2013   Formatting of this note might be different from the original. Overview:  Watery eyes and sinus pain   PAT (paroxysmal atrial tachycardia) (HCC) 07/03/2019   Right ankle injury 11/02/2010   Schizoaffective disorder (HCC)    Seasonal allergies 03/30/2013   Overview:  Watery eyes and sinus pain   Severe obesity (BMI 35.0-39.9) with comorbidity (HCC) 05/27/2019   Suicidal ideation    Type 2 diabetes mellitus without complications (HCC) 02/26/2013   Urinary tract infection 01/07/2014   UTI (urinary tract infection), bacterial 01/07/2014    No outpatient medications have been marked as taking for the 12/12/21 encounter (Video Visit) with [facility] PROVIDER.     Allergies:   Patient has no known allergies.   ROS See HPI for history of present illness.  Physical Exam Psychiatric:        Attention and Perception: Attention normal.        Behavior: Behavior is cooperative.        Thought Content: Thought content includes suicidal ideation. Thought content does not include suicidal plan.        Judgment: Judgment normal.    MDM: Pt having increased depressive symptoms and passive SI for the last 3 days. Denies plan. Has missed several doses of lexapro due to not realizing she had refills at a different pharmacy. We discussed that she will need to be seen in person and she is agreeable to be seen at [facility]. States she does not need transport  and can drive herself there after this video visit.            There are no diagnoses linked to this encounter.   Time:   Today, I have spent 11 minutes with the patient with telehealth technology discussing the above problems, reviewing the chart, previous notes, medications and orders.    Tests Ordered: No orders of the defined types were placed in this encounter.   Medication Changes: No orders of the defined types were placed in this encounter.    Disposition:  Follow up  Signed, Karrie Meres, PA-C  12/12/2021 7:12 PM

## 2021-12-19 ENCOUNTER — Other Ambulatory Visit: Payer: Self-pay | Admitting: Family Medicine

## 2021-12-26 MED ORDER — ESCITALOPRAM OXALATE 10 MG PO TABS: 10.0000 mg | ORAL_TABLET | Freq: Every day | ORAL | 7 refills | Status: DC

## 2021-12-27 ENCOUNTER — Other Ambulatory Visit: Payer: Self-pay | Admitting: Family Medicine

## 2021-12-27 DIAGNOSIS — F900 Attention-deficit hyperactivity disorder, predominantly inattentive type: Secondary | ICD-10-CM

## 2021-12-27 MED ORDER — ATOMOXETINE HCL 60 MG PO CAPS: 60.0000 mg | ORAL_CAPSULE | Freq: Every day | ORAL | 2 refills | Status: AC

## 2022-02-08 ENCOUNTER — Ambulatory Visit: Payer: Self-pay | Admitting: Family Medicine

## 2022-02-19 ENCOUNTER — Telehealth: Payer: Self-pay

## 2022-02-19 NOTE — Telephone Encounter (Signed)
PA initiated via Covermymeds; KEY: [id number]. Awaiting determination.

## 2022-02-19 NOTE — Telephone Encounter (Signed)
Received approval Ozempic 1mg /dose subcutaneous pen-injector 4 MG/3ML a quantity of 9 for 84 days has been approved for 12 months from 02/19/2022 to 02/20/2023. Patient made aware of approval..

## 2022-03-12 ENCOUNTER — Other Ambulatory Visit: Payer: Self-pay | Admitting: Family Medicine

## 2022-03-12 DIAGNOSIS — E1165 Type 2 diabetes mellitus with hyperglycemia: Secondary | ICD-10-CM

## 2022-03-13 ENCOUNTER — Encounter: Payer: Self-pay | Admitting: Family Medicine

## 2022-03-15 ENCOUNTER — Other Ambulatory Visit: Payer: Self-pay | Admitting: Family Medicine

## 2022-03-29 ENCOUNTER — Telehealth: Payer: Self-pay | Admitting: Family Medicine

## 2022-03-29 ENCOUNTER — Other Ambulatory Visit: Payer: Self-pay | Admitting: Family Medicine

## 2022-03-29 NOTE — Telephone Encounter (Signed)
Pharmacy states ins will only cover lexapro if it a 90 day supply.     [id number] - [place], [place] - [address], [place] [address] Phone: [phone]  Fax: [phone]

## 2022-03-30 ENCOUNTER — Other Ambulatory Visit: Payer: Self-pay | Admitting: Family Medicine

## 2022-03-30 MED ORDER — ESCITALOPRAM OXALATE 10 MG PO TABS: 10.0000 mg | ORAL_TABLET | Freq: Every day | ORAL | 2 refills | Status: AC

## 2022-03-30 NOTE — Telephone Encounter (Signed)
Called the patient left a detailed message of PCP instructions. 

## 2022-05-16 ENCOUNTER — Other Ambulatory Visit: Payer: Self-pay | Admitting: Cardiology

## 2022-05-18 ENCOUNTER — Other Ambulatory Visit: Payer: Self-pay | Admitting: Family Medicine

## 2022-05-22 ENCOUNTER — Encounter: Payer: Self-pay | Admitting: Family Medicine

## 2022-05-22 ENCOUNTER — Ambulatory Visit (INDEPENDENT_AMBULATORY_CARE_PROVIDER_SITE_OTHER): Payer: 59 | Admitting: Family Medicine

## 2022-05-22 VITALS — BP 122/72 | HR 83 | Temp 98.1°F | Ht 63.0 in | Wt 160.4 lb

## 2022-05-22 DIAGNOSIS — R300 Dysuria: Secondary | ICD-10-CM

## 2022-05-22 DIAGNOSIS — E785 Hyperlipidemia, unspecified: Secondary | ICD-10-CM

## 2022-05-22 DIAGNOSIS — Z23 Encounter for immunization: Secondary | ICD-10-CM | POA: Diagnosis not present

## 2022-05-22 DIAGNOSIS — E1165 Type 2 diabetes mellitus with hyperglycemia: Secondary | ICD-10-CM | POA: Diagnosis not present

## 2022-05-22 NOTE — Progress Notes (Signed)
Subjective:   Chief Complaint  Patient presents with   Follow-up    Abdominal and back pain     Karen Patrick is a 57 y.o. female here for follow-up of diabetes.   Melany's self monitored glucose range is low 100's.  Patient denies hypoglycemic reactions. She checks her glucose levels 3 time(s) per week. Patient does not require insulin.   Medications include: Farxiga 10 mg/d, Ozempic 1 mg/week Diet is usually healthy.  Exercise:   Hyperlipidemia Patient presents for hyperlipidemia follow up. Currently being treated with Lipitor 20 mg/d and compliance with treatment thus far has been good. She denies myalgias. Diet/exercise as above.  The patient is not known to have coexisting coronary artery disease. No CP or SOB.   Patient having some abdominal/back pain that tends to happen when she has a UTI.  She is taking [place] which has increased her frequency of urinary tract infections.  No fevers, bleeding, discharge.  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   Adjustment disorder with depressed mood 06/03/2016   Ankle fracture, right    Cardiomyopathy ([place]) 11/06/2017   Non-ischemic November 2018   Depression    Diabetes mellitus    Drug overdose    Essential hypertension 03/05/2017   Overview:  Added automatically from request for surgery [id number] Added automatically from request for surgery [phone]   Fracture of right orbital floor with routine healing 10/12/2014   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 Last Assessment & Plan:  R ankle distal fibula avulsion fracture - significantly improved.  Add theraband and balance exercises moving forward (demonstrated  today).  No evidence of DVT on exam - feel her pain in lateral calf and deep popliteal region is muscular - discussed warmth, redness, swelling - if occurs to seek care.  ASO for th   Midline thoracic back pain 03/06/2017   Mixed hyperlipidemia 08/26/2019   Myocardial infarction [facility])    Nonischemic cardiomyopathy ([place]) 08/26/2019   NSVT (nonsustained ventricular tachycardia) ([place]) 07/03/2019   Obesity (BMI 30-39.9) 08/26/2019   Obstructive sleep apnea 06/26/2017   11/2017 PSG AHI: 5.4, REM AHI: 25, SpO2 nadir: 86% 04/2018 PAP PSG: CPAP 6cm  01/2019 started CPAP therapy   Other hyperlipidemia 06/06/2018   Other seasonal allergic rhinitis 03/30/2013   Formatting of this note might be different from the original. Overview:  Watery eyes and sinus pain   PAT (paroxysmal atrial tachycardia) 07/03/2019   Right ankle injury 11/02/2010   Schizoaffective disorder ([place])    Seasonal allergies 03/30/2013   Overview:  Watery eyes and sinus pain   Severe obesity (BMI 35.0-39.9) with comorbidity ([place]) 05/27/2019   Suicidal ideation    Type 2 diabetes mellitus without complications ([place]) 50/27/7412   Urinary tract infection 01/07/2014   UTI (urinary tract infection), bacterial 01/07/2014     Related testing: Retinal exam: Done Pneumovax: done  Objective:  BP 122/72 (BP Location: Left Arm, Patient Position: Sitting, Cuff Size: Normal)   Pulse 83   Temp 98.1 F (36.7 C) (Oral)   Ht 5\' 3"  (1.6 m)   Wt 160 lb 6 oz (72.7 kg)   SpO2 99%   BMI 28.41 kg/m  General:  Well developed, well  nourished, in no apparent distress Skin:  Warm, no pallor or diaphoresis Lungs:  CTAB, no access msc use Cardio:  RRR, no bruits, no LE edema Psych: Age appropriate judgment and insight  Assessment:   Type 2 diabetes mellitus with hyperglycemia, without long-term current use of insulin (HCC) - Plan: Comprehensive metabolic panel, Lipid panel, Hemoglobin A1c, Urine Microalbumin w/creat. ratio  Hyperlipidemia,  unspecified hyperlipidemia type  Dysuria - Plan: Urine Microscopic Only, Urine Culture   Plan:   Chronic, stable.  Continue Ozempic 1 mg weekly, Farxiga 10 mg daily.  Counseled on diet and exercise.  Check above. Chronic, stable.  Continue Lipitor 20 mg daily. She tends to get urinary tract infections on Farxiga, will see if she has 1 today. Tdap today. F/u in 6 mo. The patient voiced understanding and agreement to the plan.  [place], DO 05/22/22 2:29 PM

## 2022-05-22 NOTE — Addendum Note (Signed)
Addended by: Jeronimo Greaves on: 05/22/2022 02:32 PM   Modules accepted: Orders

## 2022-05-22 NOTE — Patient Instructions (Signed)
Give us 2-3 business days to get the results of your labs back.   Keep the diet clean and stay active.  Let us know if you need anything. 

## 2022-05-23 ENCOUNTER — Other Ambulatory Visit: Payer: Self-pay | Admitting: Family Medicine

## 2022-05-23 LAB — COMPREHENSIVE METABOLIC PANEL
ALT: 11 U/L (ref 0–35)
AST: 13 U/L (ref 0–37)
Albumin: 4.4 g/dL (ref 3.5–5.2)
Alkaline Phosphatase: 62 U/L (ref 39–117)
BUN: 23 mg/dL (ref 6–23)
CO2: 27 mEq/L (ref 19–32)
Calcium: 9.6 mg/dL (ref 8.4–10.5)
Chloride: 102 mEq/L (ref 96–112)
Creatinine, Ser: 0.61 mg/dL (ref 0.40–1.20)
GFR: 99.49 mL/min (ref >60.00)
Glucose, Bld: 121 mg/dL — ABNORMAL HIGH (ref 70–99)
Potassium: 3.9 mEq/L (ref 3.5–5.1)
Sodium: 138 mEq/L (ref 135–145)
Total Bilirubin: 0.5 mg/dL (ref 0.2–1.2)
Total Protein: 7.1 g/dL (ref 6.0–8.3)

## 2022-05-23 LAB — MICROALBUMIN / CREATININE URINE RATIO
Creatinine,U: 59.8 mg/dL
Microalb Creat Ratio: 10.4 mg/g (ref 0.0–30.0)
Microalb, Ur: 6.2 mg/dL — ABNORMAL HIGH (ref 0.0–1.9)

## 2022-05-23 LAB — LIPID PANEL
Cholesterol: 137 mg/dL (ref 0–200)
HDL: 63.5 mg/dL (ref >39.00)
LDL Cholesterol: 49 mg/dL (ref 0–99)
NonHDL: 73.18
Total CHOL/HDL Ratio: 2
Triglycerides: 119 mg/dL (ref 0.0–149.0)
VLDL: 23.8 mg/dL (ref 0.0–40.0)

## 2022-05-23 LAB — URINALYSIS, MICROSCOPIC ONLY

## 2022-05-23 LAB — HEMOGLOBIN A1C: Hgb A1c MFr Bld: 7 % — ABNORMAL HIGH (ref 4.6–6.5)

## 2022-05-23 MED ORDER — NITROFURANTOIN MONOHYD MACRO 100 MG PO CAPS: 100.0000 mg | ORAL_CAPSULE | Freq: Two times a day (BID) | ORAL | 0 refills | Status: AC

## 2022-05-24 LAB — URINE CULTURE
MICRO NUMBER:: 14461031
SPECIMEN QUALITY:: ADEQUATE

## 2022-06-09 ENCOUNTER — Other Ambulatory Visit: Payer: Self-pay | Admitting: Family Medicine

## 2022-06-09 DIAGNOSIS — E1165 Type 2 diabetes mellitus with hyperglycemia: Secondary | ICD-10-CM

## 2022-06-11 ENCOUNTER — Telehealth: Payer: Self-pay

## 2022-06-11 NOTE — Telephone Encounter (Signed)
Called left detailed message of PA approval

## 2022-06-11 NOTE — Telephone Encounter (Signed)
PA initiated via Covermymeds; [id number]. Awaiting determination.

## 2022-06-11 NOTE — Telephone Encounter (Signed)
PA approved. Effective through 06/12/2023.

## 2022-06-13 ENCOUNTER — Other Ambulatory Visit: Payer: Self-pay | Admitting: Family Medicine

## 2022-07-12 ENCOUNTER — Encounter (HOSPITAL_BASED_OUTPATIENT_CLINIC_OR_DEPARTMENT_OTHER): Payer: Self-pay | Admitting: Pediatrics

## 2022-07-12 ENCOUNTER — Other Ambulatory Visit: Payer: Self-pay

## 2022-07-12 ENCOUNTER — Telehealth (INDEPENDENT_AMBULATORY_CARE_PROVIDER_SITE_OTHER): Payer: 59 | Admitting: Family

## 2022-07-12 ENCOUNTER — Emergency Department (HOSPITAL_BASED_OUTPATIENT_CLINIC_OR_DEPARTMENT_OTHER): Payer: 59

## 2022-07-12 ENCOUNTER — Encounter: Payer: Self-pay | Admitting: Family

## 2022-07-12 ENCOUNTER — Other Ambulatory Visit: Payer: Self-pay | Admitting: Family Medicine

## 2022-07-12 ENCOUNTER — Emergency Department (HOSPITAL_BASED_OUTPATIENT_CLINIC_OR_DEPARTMENT_OTHER)
Admission: EM | Admit: 2022-07-12 | Discharge: 2022-07-12 | Disposition: A | Discharge status: Home / Self Care | Payer: 59 | Attending: Emergency Medicine | Admitting: Emergency Medicine

## 2022-07-12 VITALS — Ht 63.0 in | Wt 162.0 lb

## 2022-07-12 DIAGNOSIS — Z79899 Other long term (current) drug therapy: Secondary | ICD-10-CM | POA: Diagnosis not present

## 2022-07-12 DIAGNOSIS — Z1152 Encounter for screening for COVID-19: Secondary | ICD-10-CM | POA: Insufficient documentation

## 2022-07-12 DIAGNOSIS — E119 Type 2 diabetes mellitus without complications: Secondary | ICD-10-CM | POA: Diagnosis not present

## 2022-07-12 DIAGNOSIS — J205 Acute bronchitis due to respiratory syncytial virus: Secondary | ICD-10-CM | POA: Diagnosis not present

## 2022-07-12 DIAGNOSIS — I10 Essential (primary) hypertension: Secondary | ICD-10-CM | POA: Insufficient documentation

## 2022-07-12 DIAGNOSIS — R051 Acute cough: Secondary | ICD-10-CM

## 2022-07-12 DIAGNOSIS — R0602 Shortness of breath: Secondary | ICD-10-CM | POA: Diagnosis not present

## 2022-07-12 DIAGNOSIS — R059 Cough, unspecified: Secondary | ICD-10-CM | POA: Diagnosis present

## 2022-07-12 LAB — RESP PANEL BY RT-PCR (RSV, FLU A&B, COVID)  RVPGX2
Influenza A by PCR: NEGATIVE
Influenza B by PCR: NEGATIVE
Resp Syncytial Virus by PCR: POSITIVE — AB
SARS Coronavirus 2 by RT PCR: NEGATIVE

## 2022-07-12 MED ORDER — ALBUTEROL SULFATE HFA 108 (90 BASE) MCG/ACT IN AERS
2.0000 | INHALATION_SPRAY | Freq: Once | RESPIRATORY_TRACT | Status: DC
  Filled 2022-07-12: qty 6.7

## 2022-07-12 MED ORDER — DEXAMETHASONE SODIUM PHOSPHATE 4 MG/ML IJ SOLN
4.0000 mg | Freq: Once | INTRAMUSCULAR | Status: AC
  Administered 2022-07-12: 4 mg via INTRAMUSCULAR
  Filled 2022-07-12: qty 1

## 2022-07-12 MED ORDER — ALBUTEROL SULFATE (2.5 MG/3ML) 0.083% IN NEBU
INHALATION_SOLUTION | RESPIRATORY_TRACT | Status: AC
  Administered 2022-07-12: 2.5 mg
  Filled 2022-07-12: qty 3

## 2022-07-12 MED ORDER — BENZONATATE 100 MG PO CAPS: 100.0000 mg | ORAL_CAPSULE | Freq: Three times a day (TID) | ORAL | 0 refills | Status: DC | PRN

## 2022-07-12 MED ORDER — BENZONATATE 100 MG PO CAPS
100.0000 mg | ORAL_CAPSULE | Freq: Once | ORAL | Status: AC
  Administered 2022-07-12: 100 mg via ORAL
  Filled 2022-07-12: qty 1

## 2022-07-12 MED ORDER — ALBUTEROL SULFATE HFA 108 (90 BASE) MCG/ACT IN AERS
2.0000 | INHALATION_SPRAY | RESPIRATORY_TRACT | Status: DC | PRN
  Administered 2022-07-12: 2 via RESPIRATORY_TRACT

## 2022-07-12 MED ORDER — METHYLPREDNISOLONE SODIUM SUCC 125 MG IJ SOLR: 125.0000 mg | Freq: Once | INTRAMUSCULAR | Status: DC

## 2022-07-12 NOTE — ED Provider Notes (Signed)
[place] EMERGENCY DEPARTMENT AT [facility] [place] Provider Note   CSN: [id number] Arrival date & time: 07/12/22  1441     History  Chief Complaint  Patient presents with   Cough    Sherette Moschella is a 57 y.o. female history of hypertension, diabetes, presenting with cough and shortness of breath.  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  She states that for the last 2 days she has been having cough and wheezing.  She states that it is hard for her to sleep due to the coughing.  She denies any fevers but has some sore throat.  She states that she is not eating or drinking much.  The history is provided by the patient.       Home Medications Prior to Admission medications   Medication Sig Start Date End Date Taking? Authorizing Provider  ascorbic acid (VITAMIN C) 500 MG tablet Take 500 mg by mouth daily.    [provider]  atomoxetine (STRATTERA) 60 MG capsule Take 1 capsule (60 mg total) by mouth daily. 12/27/21   Shelda Pal, DO  atorvastatin (LIPITOR) 20 MG tablet TAKE 1 TABLET BY MOUTH DAILY 08/21/21   Shelda Pal, DO  cariprazine (VRAYLAR) 1.5 MG capsule Take 1.5 mg by mouth daily.    [provider]  carvedilol (COREG) 12.5 MG tablet Take 1 tablet (12.5 mg total) by mouth 2 (two) times daily with a meal. 05/17/22   Munley, Hilton Cork, MD  Cranberry 400 MG TABS Take 400 mg by mouth daily.    [provider]  ENTRESTO 24-26 MG TAKE ONE TABLET BY MOUTH TWICE A DAY 09/21/21   Richardo Priest, MD  escitalopram (LEXAPRO) 10 MG tablet Take 1 tablet (10 mg total) by mouth daily. 03/30/22   Shelda Pal, DO  FARXIGA 10 MG TABS tablet TAKE ONE TABLET BY MOUTH DAILY BEFORE BREAKFAST 03/15/22   Wendling, Crosby Oyster, DO  Krill Oil 1000 MG CAPS Take 1,000 mg by mouth daily.    [provider]  Multiple Vitamin (MULTIVITAMIN) capsule Take 1 capsule by mouth daily.     [provider]  nitroGLYCERIN (NITROSTAT) 0.4 MG SL tablet Place 1 tablet (0.4 mg total) under the tongue every 5 (five) minutes as needed for chest pain. 02/02/20 02/23/21  Tobb, Kardie, DO  OZEMPIC, 1 MG/DOSE, 4 MG/3ML SOPN DIAL AND INJECT UNDER THE SKIN 1 MG WEEKLY 06/11/22   Shelda Pal, DO  spironolactone (ALDACTONE) 25 MG tablet TAKE 1 TABLET BY MOUTH DAILY 06/13/22   Shelda Pal, DO      Allergies    Patient has no known allergies.    Review of Systems   Review of Systems  Respiratory:  Positive for cough.   All other systems reviewed and are negative.   Physical Exam Updated Vital Signs BP 134/85 (BP Location: Left Arm)   Pulse 75   Temp 98.3 F (36.8 C) (Oral)   Resp 20   Ht '5\' 3"'$  (1.6 m)   Wt 73.5 kg   SpO2 100%   BMI 28.70 kg/m  Physical Exam Vitals and nursing note reviewed.  Constitutional:      Comments: Coughing and slightly uncomfortable  HENT:     Head: Normocephalic.     Right Ear: Tympanic membrane normal.     Left Ear: Tympanic membrane normal.     Nose: Nose normal.     Mouth/Throat:  Mouth: Mucous membranes are moist.  Eyes:     Extraocular Movements: Extraocular movements intact.     Pupils: Pupils are equal, round, and reactive to light.  Cardiovascular:     Rate and Rhythm: Normal rate and regular rhythm.     Pulses: Normal pulses.  Pulmonary:     Comments: + Tachypneic and mild diffuse wheezing.  No retractions. Abdominal:     General: Abdomen is flat.     Palpations: Abdomen is soft.  Musculoskeletal:        General: Normal range of motion.     Cervical back: Normal range of motion and neck supple.  Skin:    General: Skin is warm.     Capillary Refill: Capillary refill takes less than 2 seconds.  Neurological:     General: No focal deficit present.     Mental Status: She is oriented to person, place, and time.  Psychiatric:        Mood and Affect: Mood normal.        Behavior: Behavior normal.      ED Results / Procedures / Treatments   Labs (all labs ordered are listed, but only abnormal results are displayed) Labs Reviewed  RESP PANEL BY RT-PCR (RSV, FLU A&B, COVID)  RVPGX2 - Abnormal; Notable for the following components:      Result Value   Resp Syncytial Virus by PCR POSITIVE (*)    All other components within normal limits    EKG None  Radiology DG Chest 2 View  Result Date: 07/12/2022 CLINICAL DATA:  Cough, wheezing, chest pain EXAM: CHEST - 2 VIEW COMPARISON:  10/18/2020 FINDINGS: The heart size and mediastinal contours are within normal limits. Both lungs are clear. The visualized skeletal structures are unremarkable. IMPRESSION: No active cardiopulmonary disease. Electronically Signed   By: Randa Ngo M.D.   On: 07/12/2022 15:20    Procedures Procedures    Medications Ordered in ED Medications  albuterol (VENTOLIN HFA) 108 (90 Base) MCG/ACT inhaler 2 puff (has no administration in time range)  albuterol (VENTOLIN HFA) 108 (90 Base) MCG/ACT inhaler 2 puff (has no administration in time range)  benzonatate (TESSALON) capsule 100 mg (has no administration in time range)  dexamethasone (DECADRON) injection 4 mg (has no administration in time range)  albuterol (PROVENTIL) (2.5 MG/3ML) 0.083% nebulizer solution (2.5 mg  Given 07/12/22 1457)    ED Course/ Medical Decision Making/ A&P                             Medical Decision Making Magaret Lahman is a 57 y.o. female here with cough, wheezing.  Likely bronchitis versus COVID versus flu versus RSV versus pneumonia.  Plan to get COVID and flu and RSV test and chest x-ray.  Will give albuterol and reassess  5:43 PM Patient was given albuterol and still has some wheezing.  Patient is RSV positive.  Patient is not hypoxic.  Patient is diabetic so I gave her a dose of Decadron in the ED.  Will discharge home with albuterol and Tessalon Perles.   Problems Addressed: RSV bronchitis: acute illness or  injury  Amount and/or Complexity of Data Reviewed Radiology: ordered and independent interpretation performed. Decision-making details documented in ED Course.  Risk Prescription drug management.    Final Clinical Impression(s) / ED Diagnoses Final diagnoses:  None    Rx / DC Orders ED Discharge Orders     None  Drenda Freeze, MD 07/12/22 [id number]

## 2022-07-12 NOTE — Discharge Instructions (Addendum)
As we discussed you have RSV.  You are likely to have some cough and wheezing for several days.  Please use albuterol 2 puffs every 4 hours as needed for cough and wheezing  You may take Tessalon Perles as needed for cough.  You were given a shot of steroids that is going to last for 48 hours.  Your blood sugar may be elevated for several days  See your doctor for follow-up  Return to ER if you have worse cough or shortness of breath or fever or trouble breathing

## 2022-07-12 NOTE — ED Triage Notes (Signed)
C/O shortness of breath,  productive cough and chest pain since Tuesday. Reports has had fever also,

## 2022-07-12 NOTE — Progress Notes (Signed)
Karen Patrick is a 57 y.o. 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   NSVT (nonsustained ventricular tachycardia) ([place]) 07/03/2019   Hyperlipidemia 06/06/2018   ADHD (attention deficit hyperactivity disorder), 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   Cardiomyopathy ([place]) 11/06/2017   Obstructive sleep apnea 06/26/2017   Midline thoracic back pain 03/06/2017   Essential hypertension 03/05/2017   History of hypertension 03/05/2017   Adjustment disorder with depressed mood 06/03/2016   Drug overdose    Suicidal ideation    Fracture of right orbital floor with routine healing 10/12/2014   Urinary tract infection 01/07/2014   UTI (urinary tract infection), bacterial 01/07/2014   Acute pancreatitis 01/06/2014   Seasonal allergies 03/30/2013   Other seasonal allergic rhinitis 03/30/2013   Type 2 diabetes mellitus with hyperglycemia, without long-term current use of insulin ([place]) 02/26/2013   Type 2 diabetes mellitus without complications ([place]) [id number]   Schizoaffective disorder ([place]) 06/04/2012   Injury of right ankle 11/02/2010   Right ankle injury 11/02/2010    Current Outpatient Medications  Medication Sig Dispense Refill   ascorbic acid (VITAMIN C) 500 MG tablet Take 500 mg by mouth daily.     atomoxetine (STRATTERA) 60 MG capsule Take 1 capsule (60 mg total) by mouth daily. 90 capsule 2   atorvastatin (LIPITOR) 20 MG tablet TAKE 1 TABLET BY MOUTH DAILY 30 tablet 11   carvedilol (COREG) 12.5 MG tablet Take 1 tablet (12.5 mg total) by mouth 2 (two) times daily  with a meal. 180 tablet 0   Cranberry 400 MG TABS Take 400 mg by mouth daily.     ENTRESTO 24-26 MG TAKE ONE TABLET BY MOUTH TWICE A DAY 60 tablet 5   escitalopram (LEXAPRO) 10 MG tablet Take 1 tablet (10 mg total) by mouth daily. 90 tablet 2   FARXIGA 10 MG TABS tablet TAKE ONE TABLET BY MOUTH DAILY BEFORE BREAKFAST 30 tablet 3   Krill Oil 1000 MG CAPS Take 1,000 mg by mouth daily.     Multiple Vitamin (MULTIVITAMIN) capsule Take 1 capsule by mouth daily.     OZEMPIC, 1 MG/DOSE, 4 MG/3ML SOPN DIAL AND INJECT UNDER THE SKIN 1 MG WEEKLY 3 mL 2   spironolactone (ALDACTONE) 25 MG tablet TAKE 1 TABLET BY MOUTH DAILY 90 tablet 1   cariprazine (VRAYLAR) 1.5 MG capsule Take 1.5 mg by mouth daily.     nitroGLYCERIN (NITROSTAT) 0.4 MG SL tablet Place 1 tablet (0.4 mg total) under the tongue every 5 (five) minutes as needed for chest pain. 90 tablet 3   No current facility-administered medications for this visit.   Facility-Administered Medications Ordered in Other Visits  Medication Dose Route Frequency Provider Last Rate Last Admin   albuterol (VENTOLIN HFA) 108 (90 Base) MCG/ACT inhaler 2 puff  2 puff Inhalation Q2H PRN Audley Hose, MD        Allergies: Patient has no known allergies.  Past Medical History:  Diagnosis Date   Acute pancreatitis [id number]   Acute systolic congestive heart failure (HCC)    ADHD (attention deficit hyperactivity  disorder), 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   Adjustment disorder with depressed mood 06/03/2016   Ankle fracture, right    Cardiomyopathy ([place]) 11/06/2017   Non-ischemic November 2018   Depression    Diabetes mellitus    Drug overdose    Essential hypertension 03/05/2017   Overview:  Added automatically from request for surgery [id number] Added automatically from request for surgery [phone]   Fracture of right orbital floor with routine healing 10/12/2014   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 Last Assessment & Plan:  R ankle distal fibula avulsion fracture - significantly improved.  Add theraband and balance exercises moving forward (demonstrated today).  No evidence of DVT on exam - feel her pain in lateral calf and deep popliteal region is muscular - discussed warmth, redness, swelling - if occurs to seek care.  ASO for th   Midline thoracic back pain 03/06/2017   Mixed hyperlipidemia 08/26/2019   Myocardial infarction [facility])    Nonischemic cardiomyopathy ([place]) 08/26/2019   NSVT (nonsustained ventricular tachycardia) ([place]) 07/03/2019   Obesity (BMI 30-39.9) 08/26/2019   Obstructive sleep apnea 06/26/2017   11/2017 PSG AHI: 5.4, REM AHI: 25, SpO2 nadir: 86% 04/2018 PAP PSG: CPAP 6cm  01/2019 started CPAP therapy   Other hyperlipidemia 06/06/2018   Other seasonal allergic rhinitis 03/30/2013   Formatting of this note might be different from the original. Overview:  Watery eyes and sinus pain   PAT (paroxysmal atrial tachycardia) 07/03/2019   Right ankle injury 11/02/2010   Schizoaffective disorder ([place])    Seasonal allergies 03/30/2013   Overview:  Watery eyes and sinus pain   Severe obesity (BMI 35.0-39.9) with comorbidity ([place]) 05/27/2019   Suicidal ideation    Type 2 diabetes mellitus without complications ([place]) [id number]   Urinary tract infection 01/07/2014   UTI (urinary tract infection), bacterial 01/07/2014    Past Surgical History:  Procedure Laterality Date   BACK SURGERY     CESAREAN SECTION     ENDOMETRIAL ABLATION W/ NOVASURE     FOOT FASCIOTOMY     HAMMER TOE SURGERY     KNEE ARTHROCENTESIS     LIPOMA EXCISION      Family History  Problem Relation Age of Onset   Stroke Mother    Diabetes Mother    Hypertension Mother    Cancer Mother    Heart failure Mother    Stroke Father    Sudden death Father    Hypertension Father    Hyperlipidemia  Father    Heart attack Father    Diabetes Father    Breast cancer Maternal Aunt    Breast cancer Maternal Aunt    Breast cancer Maternal Aunt    Breast cancer Paternal Aunt    Cancer Paternal Aunt    Diabetes Maternal Grandmother    Heart attack Maternal Grandmother    Hypertension Maternal Grandmother    Diabetes Paternal Grandmother    Sudden death Paternal Grandmother     Social History   Tobacco Use   Smoking status: Former    Packs/day: 1.5    Types: Cigarettes    Start date: 1983    Quit date: 2006    Years since quitting: 18.2   Smokeless tobacco: Never  Substance Use Topics   Alcohol use: Yes    Subjective:    I connected with  Serena Colonel on 07/12/22 at  1:40 PM EDT by a video enabled telemedicine application and verified that I am speaking with the correct person using two identifiers.   I discussed the limitations of evaluation and management by telemedicine and the availability of in person appointments. The patient expressed understanding and agreed to proceed. Provider in office/ patient is at home; provider and patient are only 2 people on video call.   Cough/ Congestion x 1 week; patient is visibly ill and having a hard time speaking/ breathing to conduct the visit;      Objective:  Vitals:   07/12/22 1338  Weight: 162 lb (73.5 kg)  Height: '5\' 3"'$  (1.6 m)    General: Well developed, well nourished, in no acute distress  Skin : Warm and dry.  Head: Normocephalic and atraumatic  Lungs: Respirations labored Neurologic: Alert and oriented; speech intact; face symmetrical; moves all extremities well; CNII-XII intact without focal deficit   Assessment:  1. Shortness of breath   2. Acute cough     Plan:  During course of visit, I had patient call her husband. I spoke to him over computer and discussed the need to get her seen in person immediately. He agrees to come home directly from work and take her to the ER; she will plan to see her PCP, Dr.  Nani Ravens in follow up.   No follow-ups on file.  No orders of the defined types were placed in this encounter.   Requested Prescriptions    No prescriptions requested or ordered in this encounter

## 2022-07-12 NOTE — ED Notes (Signed)
Reviewed discharge instructions with pt Pt states understanding. Pt ambulatory at discharge

## 2022-08-06 ENCOUNTER — Telehealth: Payer: Self-pay | Admitting: Family Medicine

## 2022-08-06 DIAGNOSIS — Z794 Long term (current) use of insulin: Secondary | ICD-10-CM

## 2022-08-06 NOTE — Telephone Encounter (Signed)
Pt requested the following medications to be 90 supplies  Prescription Request  08/06/2022  Is this a "Controlled Substance" medicine? No  LOV: 05/22/2022  What is the name of the medication or equipment?   OZEMPIC, 1 MG/DOSE, 4 MG/3ML SOPN [[id number]]   FARXIGA 10 MG TABS tablet [[id number]]   ENTRESTO 24-26 MG [[id number]]   Have you contacted your pharmacy to request a refill? No   Which pharmacy would you like this sent to?   [facility] [id number] - [place] - [address] [place]  [address] Phone: [phone] Fax: [phone]   Patient notified that their request is being sent to the clinical staff for review and that they should receive a response within 2 business days.   Please advise at Mobile [phone] (mobile)

## 2022-08-06 NOTE — Telephone Encounter (Signed)
Farxiga and Ozempic already have refills on file at [facility]. Sherryll Burger is by cardiology. Dr. Dulce Sellar.

## 2022-08-07 NOTE — Telephone Encounter (Signed)
Pt was called and advised of info. Pt stated that she recently got new insurance and they do require 90-day supplies for better OOP costs. Pt stated that the current scripts need to be rewritten as such and she will look into Entresto with Cardio as her previous provider retired.

## 2022-08-07 NOTE — Telephone Encounter (Signed)
a 

## 2022-08-07 NOTE — Telephone Encounter (Signed)
Called left message to call back with medications that are in need of being refilled at this time and what pharmacy she would like them to go to.

## 2022-08-08 ENCOUNTER — Telehealth: Payer: Self-pay | Admitting: *Deleted

## 2022-08-08 ENCOUNTER — Telehealth: Payer: Self-pay | Admitting: Cardiology

## 2022-08-08 MED ORDER — DAPAGLIFLOZIN PROPANEDIOL 10 MG PO TABS: 10.0000 mg | ORAL_TABLET | Freq: Every day | ORAL | 3 refills | Status: DC

## 2022-08-08 MED ORDER — OZEMPIC (1 MG/DOSE) 4 MG/3ML ~~LOC~~ SOPN: PEN_INJECTOR | SUBCUTANEOUS | 2 refills | Status: DC

## 2022-08-08 MED ORDER — ENTRESTO 24-26 MG PO TABS: 1.0000 | ORAL_TABLET | Freq: Two times a day (BID) | ORAL | 0 refills | Status: DC

## 2022-08-08 NOTE — Telephone Encounter (Signed)
Please send 90 day supply for Ozempic and Farxiga to [facility] on [address].

## 2022-08-08 NOTE — Telephone Encounter (Signed)
Prior auth started via cover my meds.  Awaiting determination.  Key: [id number]

## 2022-08-08 NOTE — Addendum Note (Signed)
Addended by: Heywood Bene on: 08/08/2022 03:15 PM   Modules accepted: Orders

## 2022-08-08 NOTE — Telephone Encounter (Signed)
Pt called stating that she would like call back from Robin when she is available

## 2022-08-08 NOTE — Telephone Encounter (Signed)
Called left message to call back with medications in need of refilling as well what pharmacy to send to to 90 days

## 2022-08-08 NOTE — Addendum Note (Signed)
Addended by: Scharlene Gloss B on: 08/08/2022 04:00 PM   Modules accepted: Orders

## 2022-08-08 NOTE — Telephone Encounter (Signed)
Sent in for 90 days 

## 2022-08-08 NOTE — Telephone Encounter (Signed)
*  STAT* If patient is at the pharmacy, call can be transferred to refill team.   1. Which medications need to be refilled? (please list name of each medication and dose if known) ENTRESTO 24-26 MG   2. Which pharmacy/location (including street and city if local pharmacy) is medication to be sent to? [facility] [id number] - [place], [place] - [address]    3. Do they need a 30 day or 90 day supply? 90

## 2022-08-13 NOTE — Telephone Encounter (Signed)
Is on your desk.

## 2022-08-13 NOTE — Telephone Encounter (Signed)
Can you please be on the look out for her denial letter?  Not sure why they denied it.

## 2022-08-14 NOTE — Telephone Encounter (Signed)
Spoke with insurance and they needed labs for reconsideration.  Fax to [phone].  Labs faxed.

## 2022-08-18 DIAGNOSIS — N200 Calculus of kidney: Secondary | ICD-10-CM | POA: Insufficient documentation

## 2022-08-18 HISTORY — DX: Calculus of kidney: N20.0

## 2022-08-20 ENCOUNTER — Telehealth: Payer: Self-pay

## 2022-08-20 NOTE — Transitions of Care (Post Inpatient/ED Visit) (Unsigned)
   08/20/2022  Name: Karen Patrick MRN: [id number] DOB: 1965/11/24  Today's TOC FU Call Status: Today's TOC FU Call Status:: Unsuccessul Call (1st Attempt) Unsuccessful Call (1st Attempt) Date: 08/20/22  Attempted to reach the patient regarding the most recent Inpatient/ED visit.  Follow Up Plan: Additional outreach attempts will be made to reach the patient to complete the Transitions of Care (Post Inpatient/ED visit) call.   Signature Karena Addison, LPN [facility] Nurse Health Advisor Direct Dial [phone]

## 2022-08-21 NOTE — Transitions of Care (Post Inpatient/ED Visit) (Signed)
   08/21/2022  Name: Karen Patrick MRN: [id number] DOB: 1965/10/01  Today's TOC FU Call Status: Today's TOC FU Call Status:: Successful TOC FU Call Competed Unsuccessful Call (1st Attempt) Date: 08/20/22 [facility] FU Call Complete Date: 08/21/22  Transition Care Management Follow-up Telephone Call Date of Discharge: 08/20/22 Discharge Facility: Other ([facility]) Name of Other ([facility]) Discharge Facility: [facility] Type of Discharge: Inpatient Admission Primary Inpatient Discharge Diagnosis:: fever How have you been since you were released from the hospital?: Better Any questions or concerns?: No  Items Reviewed: Did you receive and understand the discharge instructions provided?: Yes Medications obtained and verified?: Yes (Medications Reviewed) Any new allergies since your discharge?: No Dietary orders reviewed?: Yes Do you have support at home?: No  Home Care and Equipment/Supplies: Were Home Health Services Ordered?: NA Any new equipment or medical supplies ordered?: NA  Functional Questionnaire: Do you need assistance with bathing/showering or dressing?: No Do you need assistance with meal preparation?: No Do you need assistance with eating?: No Do you have difficulty maintaining continence: No Do you need assistance with getting out of bed/getting out of a chair/moving?: No Do you have difficulty managing or taking your medications?: No  Follow up appointments reviewed: PCP Follow-up appointment confirmed?: Yes Date of PCP follow-up appointment?: 08/24/22 Follow-up Provider: [facility] Follow-up appointment confirmed?: No Reason Specialist Follow-Up Not Confirmed: Patient has Specialist Provider Number and will Call for Appointment Do you need transportation to your follow-up appointment?: No Do you understand care options if your condition(s) worsen?: Yes-patient verbalized understanding    SIGNATURE Karena Addison, LPN [facility]  Nurse Health Advisor Direct Dial [phone]

## 2022-08-22 ENCOUNTER — Other Ambulatory Visit: Payer: Self-pay | Admitting: Family Medicine

## 2022-08-24 ENCOUNTER — Ambulatory Visit: Payer: 59 | Admitting: Family Medicine

## 2022-08-24 ENCOUNTER — Encounter: Payer: Self-pay | Admitting: Family Medicine

## 2022-08-24 VITALS — BP 118/68 | HR 45 | Temp 98.3°F | Ht 62.5 in | Wt 162.0 lb

## 2022-08-24 DIAGNOSIS — N200 Calculus of kidney: Secondary | ICD-10-CM

## 2022-08-24 DIAGNOSIS — Z794 Long term (current) use of insulin: Secondary | ICD-10-CM

## 2022-08-24 DIAGNOSIS — N12 Tubulo-interstitial nephritis, not specified as acute or chronic: Secondary | ICD-10-CM | POA: Diagnosis not present

## 2022-08-24 DIAGNOSIS — A419 Sepsis, unspecified organism: Secondary | ICD-10-CM | POA: Diagnosis not present

## 2022-08-24 DIAGNOSIS — E1165 Type 2 diabetes mellitus with hyperglycemia: Secondary | ICD-10-CM | POA: Diagnosis not present

## 2022-08-24 MED ORDER — DAPAGLIFLOZIN PROPANEDIOL 10 MG PO TABS: 10.0000 mg | ORAL_TABLET | Freq: Every day | ORAL | 3 refills | Status: DC

## 2022-08-24 MED ORDER — OZEMPIC (1 MG/DOSE) 4 MG/3ML ~~LOC~~ SOPN: PEN_INJECTOR | SUBCUTANEOUS | 2 refills | Status: DC

## 2022-08-24 NOTE — Progress Notes (Signed)
Chief Complaint  Patient presents with   Hospitalization Follow-up    Kidney Stone    HPI Karen Patrick is a 57 y.o. y.o. female who presents for a transition of care visit.  Pt was discharged from [facility] on 08/20/22.  Within 48 business hours of discharge our office contacted pt via telephone to coordinate care and needs.   On 08/18/2022, the patient started feeling very poorly with abdominal pain and went to the emergency department.  She thought she had a kidney stone was found to be septic.  She was admitted and treated for pyelonephritis and had a stent placed.  She needs to have this removed on 08/30/2022.  She was discharged on cefdinir.  Culture did show Candida and she was also placed on Diflucan twice daily for 14 days.  She is compliant with this medication and reports no adverse effects.  She is no longer having pain, fevers, urinary complaints, or nausea/vomiting.  She is staying hydrated, diet is fair.  Unfortunately, she has been unable to get her [place] and Ozempic.  She was told that she needs a prior authorization.  She has been off of it for a couple weeks and her hunger has gone up as well as her sugars and oral intake.  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   Adjustment disorder with depressed mood 06/03/2016   Ankle fracture, right    Cardiomyopathy (HCC) 11/06/2017   Non-ischemic November 2018   Depression    Diabetes mellitus    Drug overdose    Essential hypertension 03/05/2017   Overview:  Added automatically from request for surgery [id number] Added automatically from request for surgery [phone]   Fracture of right orbital floor with routine healing 10/12/2014   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 Last Assessment & Plan:  R ankle distal fibula avulsion fracture - significantly improved.  Add theraband and balance exercises moving forward (demonstrated today).  No evidence of DVT on exam - feel her pain in lateral calf and deep popliteal region is muscular - discussed warmth, redness, swelling - if occurs to seek care.  ASO for th   Midline thoracic back pain 03/06/2017   Mixed hyperlipidemia 08/26/2019   Myocardial infarction [facility])    Nonischemic cardiomyopathy (HCC) 08/26/2019   NSVT (nonsustained ventricular tachycardia) (HCC) 07/03/2019   Obesity (BMI 30-39.9) 08/26/2019   Obstructive sleep apnea 06/26/2017   11/2017 PSG AHI: 5.4, REM AHI: 25, SpO2 nadir: 86% 04/2018 PAP PSG: CPAP 6cm  01/2019 started CPAP therapy   Other hyperlipidemia 06/06/2018   Other seasonal allergic rhinitis 03/30/2013   Formatting of this note might be different from the original. Overview:  Watery eyes and sinus pain   PAT (paroxysmal atrial tachycardia) 07/03/2019   Right ankle injury 11/02/2010   Schizoaffective disorder (HCC)    Seasonal allergies 03/30/2013   Overview:  Watery eyes and sinus pain   Severe obesity (BMI 35.0-39.9) with comorbidity (HCC) 05/27/2019   Suicidal ideation    Type 2 diabetes mellitus without complications (HCC) 02/26/2013   Urinary tract infection 01/07/2014   UTI (urinary tract infection), bacterial 01/07/2014   Past Surgical History:  Procedure Laterality Date   BACK SURGERY  CESAREAN SECTION     ENDOMETRIAL ABLATION W/ NOVASURE     FOOT FASCIOTOMY     HAMMER TOE SURGERY     KNEE ARTHROCENTESIS     LIPOMA EXCISION     Family History  Problem Relation Age of Onset   Stroke Mother    Diabetes Mother    Hypertension Mother    Cancer Mother    Heart failure Mother    Stroke Father    Sudden death Father    Hypertension Father    Hyperlipidemia Father    Heart attack Father    Diabetes Father     Breast cancer Maternal Aunt    Breast cancer Maternal Aunt    Breast cancer Maternal Aunt    Breast cancer Paternal Aunt    Cancer Paternal Aunt    Diabetes Maternal Grandmother    Heart attack Maternal Grandmother    Hypertension Maternal Grandmother    Diabetes Paternal Grandmother    Sudden death Paternal Grandmother    Allergies as of 08/24/2022   No Known Allergies      Medication List        Accurate as of August 24, 2022  2:19 PM. If you have any questions, ask your nurse or doctor.          STOP taking these medications    benzonatate 100 MG capsule Commonly known as: TESSALON   Vraylar 1.5 MG capsule Generic drug: cariprazine       TAKE these medications    ascorbic acid 500 MG tablet Commonly known as: VITAMIN C Take 500 mg by mouth daily.   atomoxetine 60 MG capsule Commonly known as: Strattera Take 1 capsule (60 mg total) by mouth daily.   atorvastatin 20 MG tablet Commonly known as: LIPITOR TAKE 1 TABLET BY MOUTH DAILY   carvedilol 12.5 MG tablet Commonly known as: COREG Take 1 tablet (12.5 mg total) by mouth 2 (two) times daily with a meal.   Cranberry 400 MG Tabs Take 400 mg by mouth daily.   dapagliflozin propanediol 10 MG Tabs tablet Commonly known as: Farxiga Take 1 tablet (10 mg total) by mouth daily before breakfast.   Entresto 24-26 MG Generic drug: sacubitril-valsartan Take 1 tablet by mouth 2 (two) times daily.   escitalopram 10 MG tablet Commonly known as: Lexapro Take 1 tablet (10 mg total) by mouth daily.   Krill Oil 1000 MG Caps Take 1,000 mg by mouth daily.   lamoTRIgine 100 MG tablet Commonly known as: LAMICTAL Take 100 mg by mouth daily.   multivitamin capsule Take 1 capsule by mouth daily.   nitroGLYCERIN 0.4 MG SL tablet Commonly known as: NITROSTAT Place 1 tablet (0.4 mg total) under the tongue every 5 (five) minutes as needed for chest pain.   Ozempic (1 MG/DOSE) 4 MG/3ML Sopn Generic drug:  Semaglutide (1 MG/DOSE) DIAL AND INJECT UNDER THE SKIN 1 MG WEEKLY   spironolactone 25 MG tablet Commonly known as: ALDACTONE TAKE 1 TABLET BY MOUTH DAILY        ROS:  Constitutional: No fevers or chills, no weight loss HEENT: No headaches, hearing loss, or runny nose, no sore throat Heart: No chest pain Lungs: No SOB, no cough Abd: No bowel changes, no pain, no N/V GU: No urinary complaints Neuro: No numbness, tingling or weakness Msk: No joint or muscle pain  Objective BP 118/68 (BP Location: Left Arm, Patient Position: Sitting, Cuff Size: Normal)   Pulse (!) 45   Temp 98.3 F (  36.8 C) (Oral)   Ht 5' 2.5" (1.588 m)   Wt 162 lb (73.5 kg)   SpO2 98%   BMI 29.16 kg/m  General Appearance:  awake, alert, oriented, in no acute distress and well developed, well nourished Skin:  there are no suspicious lesions or rashes of concern Head/face:  NCAT Mouth/Throat: MMM Neck:  neck- supple, no mass, non-tender and no jvd Lungs: Clear to auscultation.  No rales, rhonchi, or wheezing. Normal effort, no accessory muscle use. Heart:  Heart sounds are normal.  Regular rhythm, bradycardic, no gallop or rub. No bruits. Abdomen:  BS+, soft, NT, ND, no masses or organomegaly Musculoskeletal:  No CVA TTP Neurologic: Gait is normal, no cerebellar signs Psych exam: Nml mood and affect, age appropriate judgment and insight  Sepsis, due to unspecified organism, unspecified whether acute organ dysfunction present (HCC)  Pyelonephritis - Plan: CBC, Basic metabolic panel  Kidney stone - Plan: CBC, Basic metabolic panel  Type 2 diabetes mellitus with hyperglycemia, with long-term current use of insulin (HCC) - Plan: Semaglutide, 1 MG/DOSE, (OZEMPIC, 1 MG/DOSE,) 4 MG/3ML SOPN  Follow-up on abnormal labs.  Appreciate urology input.  We need to get her back on her diabetic medications as she was relatively well-controlled on these.  Hopefully this will trigger the prior authorization process.   Follow-up pending the above. The patient voiced understanding and agreement to the plan.  Jilda Roche [place], DO 08/24/22 2:19 PM

## 2022-08-27 ENCOUNTER — Telehealth: Payer: Self-pay

## 2022-08-27 NOTE — Telephone Encounter (Signed)
PA initiated via Covermymeds; KEY: [id number]. Awaiting determination.

## 2022-08-29 ENCOUNTER — Telehealth: Payer: Self-pay | Admitting: Pharmacy Technician

## 2022-08-29 NOTE — Telephone Encounter (Signed)
PA approved.

## 2022-08-29 NOTE — Telephone Encounter (Signed)
Status of PA in separate encounter 

## 2022-09-19 ENCOUNTER — Encounter: Payer: Self-pay | Admitting: Family Medicine

## 2022-09-19 ENCOUNTER — Telehealth: Payer: 59 | Admitting: Physician Assistant

## 2022-09-19 ENCOUNTER — Other Ambulatory Visit: Payer: Self-pay | Admitting: Family Medicine

## 2022-09-19 MED ORDER — TORSEMIDE 20 MG PO TABS: 20.0000 mg | ORAL_TABLET | Freq: Every day | ORAL | 5 refills | Status: DC

## 2022-09-19 NOTE — Progress Notes (Signed)
The patient no-showed for appointment despite this provider sending direct link, reaching out via phone with no response and waiting for at least 10 minutes from appointment time for patient to join. They will be marked as a NS for this appointment/time.   Derek Laughter M Mikell Camp, PA-C    

## 2022-10-01 ENCOUNTER — Other Ambulatory Visit: Payer: Self-pay | Admitting: Family Medicine

## 2022-10-06 ENCOUNTER — Other Ambulatory Visit (HOSPITAL_BASED_OUTPATIENT_CLINIC_OR_DEPARTMENT_OTHER): Payer: Self-pay | Admitting: *Deleted

## 2022-10-06 DIAGNOSIS — N201 Calculus of ureter: Secondary | ICD-10-CM

## 2022-10-08 ENCOUNTER — Other Ambulatory Visit: Payer: Self-pay | Admitting: Cardiology

## 2022-10-08 ENCOUNTER — Other Ambulatory Visit: Payer: Self-pay | Admitting: Family Medicine

## 2022-10-09 ENCOUNTER — Other Ambulatory Visit: Payer: Self-pay

## 2022-10-09 MED ORDER — CARVEDILOL 12.5 MG PO TABS: 12.5000 mg | ORAL_TABLET | Freq: Two times a day (BID) | ORAL | 0 refills | Status: DC

## 2022-10-09 NOTE — Telephone Encounter (Signed)
Rx for carvedilol sent to pharmacy, patient needs appointment for future refill

## 2022-10-09 NOTE — Telephone Encounter (Signed)
Rx sent to pharmacy, patient needs appointment for future refill 

## 2022-10-10 ENCOUNTER — Other Ambulatory Visit: Payer: Self-pay

## 2022-10-10 ENCOUNTER — Emergency Department (HOSPITAL_BASED_OUTPATIENT_CLINIC_OR_DEPARTMENT_OTHER)
Admission: EM | Admit: 2022-10-10 | Discharge: 2022-10-11 | Disposition: A | Discharge status: Home / Self Care | Payer: 59 | Attending: Emergency Medicine | Admitting: Emergency Medicine

## 2022-10-10 ENCOUNTER — Encounter (HOSPITAL_BASED_OUTPATIENT_CLINIC_OR_DEPARTMENT_OTHER): Payer: Self-pay

## 2022-10-10 ENCOUNTER — Emergency Department (HOSPITAL_BASED_OUTPATIENT_CLINIC_OR_DEPARTMENT_OTHER): Payer: 59

## 2022-10-10 DIAGNOSIS — N390 Urinary tract infection, site not specified: Secondary | ICD-10-CM | POA: Diagnosis not present

## 2022-10-10 DIAGNOSIS — B9689 Other specified bacterial agents as the cause of diseases classified elsewhere: Secondary | ICD-10-CM | POA: Diagnosis not present

## 2022-10-10 DIAGNOSIS — Z794 Long term (current) use of insulin: Secondary | ICD-10-CM | POA: Insufficient documentation

## 2022-10-10 DIAGNOSIS — R1030 Lower abdominal pain, unspecified: Secondary | ICD-10-CM | POA: Diagnosis present

## 2022-10-10 LAB — URINALYSIS, ROUTINE W REFLEX MICROSCOPIC
Bilirubin Urine: NEGATIVE
Glucose, UA: >=500 mg/dL — AB
Hgb urine dipstick: NEGATIVE
Ketones, ur: NEGATIVE mg/dL
Leukocytes,Ua: NEGATIVE
Nitrite: NEGATIVE
Protein, ur: NEGATIVE mg/dL
Specific Gravity, Urine: 1.02 (ref 1.005–1.030)
pH: 5.5 (ref 5.0–8.0)

## 2022-10-10 LAB — COMPREHENSIVE METABOLIC PANEL
ALT: 15 U/L (ref 0–44)
AST: 15 U/L (ref 15–41)
Albumin: 4.1 g/dL (ref 3.5–5.0)
Alkaline Phosphatase: 70 U/L (ref 38–126)
Anion gap: 10 (ref 5–15)
BUN: 15 mg/dL (ref 6–20)
CO2: 25 mmol/L (ref 22–32)
Calcium: 8.5 mg/dL — ABNORMAL LOW (ref 8.9–10.3)
Chloride: 99 mmol/L (ref 98–111)
Creatinine, Ser: 0.92 mg/dL (ref 0.44–1.00)
GFR, Estimated: >60 mL/min (ref >60)
Glucose, Bld: 139 mg/dL — ABNORMAL HIGH (ref 70–99)
Potassium: 3.3 mmol/L — ABNORMAL LOW (ref 3.5–5.1)
Sodium: 134 mmol/L — ABNORMAL LOW (ref 135–145)
Total Bilirubin: 0.7 mg/dL (ref 0.3–1.2)
Total Protein: 7.5 g/dL (ref 6.5–8.1)

## 2022-10-10 LAB — URINALYSIS, MICROSCOPIC (REFLEX): RBC / HPF: NONE SEEN RBC/hpf (ref 0–5)

## 2022-10-10 LAB — CBC
HCT: 42.1 % (ref 36.0–46.0)
Hemoglobin: 13.9 g/dL (ref 12.0–15.0)
MCH: 29.9 pg (ref 26.0–34.0)
MCHC: 33 g/dL (ref 30.0–36.0)
MCV: 90.5 fL (ref 80.0–100.0)
Platelets: 260 10*3/uL (ref 150–400)
RBC: 4.65 MIL/uL (ref 3.87–5.11)
RDW: 12.7 % (ref 11.5–15.5)
WBC: 6 10*3/uL (ref 4.0–10.5)
nRBC: 0 % (ref 0.0–0.2)

## 2022-10-10 LAB — LACTIC ACID, PLASMA: Lactic Acid, Venous: 1.3 mmol/L (ref 0.5–1.9)

## 2022-10-10 LAB — LIPASE, BLOOD: Lipase: 35 U/L (ref 11–51)

## 2022-10-10 LAB — PREGNANCY, URINE: Preg Test, Ur: NEGATIVE

## 2022-10-10 MED ORDER — KETOROLAC TROMETHAMINE 30 MG/ML IJ SOLN
15.0000 mg | Freq: Once | INTRAMUSCULAR | Status: AC
  Administered 2022-10-10: 15 mg via INTRAVENOUS
  Filled 2022-10-10: qty 1

## 2022-10-10 MED ORDER — ONDANSETRON HCL 4 MG/2ML IJ SOLN
4.0000 mg | Freq: Once | INTRAMUSCULAR | Status: AC
  Administered 2022-10-10: 4 mg via INTRAVENOUS
  Filled 2022-10-10: qty 2

## 2022-10-10 NOTE — ED Triage Notes (Signed)
Pt reports Temp max 101.5, responded to tylenol.  No temp on arrival

## 2022-10-10 NOTE — ED Triage Notes (Signed)
Pt with abdominal pain beginning 2 days, hx septic kidney stone, had resolved, scheduled for follow up CT tomorrow, now having cloudy urine, nausea and pain

## 2022-10-11 ENCOUNTER — Ambulatory Visit (HOSPITAL_BASED_OUTPATIENT_CLINIC_OR_DEPARTMENT_OTHER): Payer: 59

## 2022-10-11 MED ORDER — PHENAZOPYRIDINE HCL 200 MG PO TABS: 200.0000 mg | ORAL_TABLET | Freq: Three times a day (TID) | ORAL | 0 refills | Status: AC

## 2022-10-11 MED ORDER — CEPHALEXIN 500 MG PO CAPS: 500.0000 mg | ORAL_CAPSULE | Freq: Four times a day (QID) | ORAL | 0 refills | Status: AC

## 2022-10-11 MED ORDER — PHENAZOPYRIDINE HCL 100 MG PO TABS
200.0000 mg | ORAL_TABLET | Freq: Once | ORAL | Status: AC
  Administered 2022-10-11: 200 mg via ORAL
  Filled 2022-10-11: qty 2

## 2022-10-11 MED ORDER — CEPHALEXIN 250 MG PO CAPS
500.0000 mg | ORAL_CAPSULE | Freq: Once | ORAL | Status: AC
  Administered 2022-10-11: 500 mg via ORAL
  Filled 2022-10-11: qty 2

## 2022-10-11 NOTE — ED Provider Notes (Signed)
[place] [facility] Provider Note   CSN: [id number] Arrival date & time: 10/10/22  1743     History  Chief Complaint  Patient presents with   Abdominal Pain    Karen Patrick is a 57 y.o. female.  The history is provided by the patient.  Abdominal Pain Pain location:  Suprapubic Pain radiates to:  Does not radiate Pain severity:  Moderate Onset quality:  Gradual Duration:  2 days Timing:  Constant Progression:  Waxing and waning Chronicity:  New Context: not trauma   Relieved by:  Nothing Worsened by:  Nothing Associated symptoms: dysuria, fever and nausea   Associated symptoms comment:  Dysuria cloudy urine       Home Medications Prior to Admission medications   Medication Sig Start Date End Date Taking? Authorizing Provider  cephALEXin (KEFLEX) 500 MG capsule Take 1 capsule (500 mg total) by mouth 4 (four) times daily. 10/11/22  Yes Lovelee Forner, MD  phenazopyridine (PYRIDIUM) 200 MG tablet Take 1 tablet (200 mg total) by mouth 3 (three) times daily. 10/11/22  Yes Leira Regino, MD  ascorbic acid (VITAMIN C) 500 MG tablet Take 500 mg by mouth daily.    [provider]  atomoxetine (STRATTERA) 60 MG capsule Take 1 capsule (60 mg total) by mouth daily. 12/27/21   Sharlene Dory, DO  atorvastatin (LIPITOR) 20 MG tablet TAKE ONE TABLET BY MOUTH DAILY 10/01/22   Carmelia Roller, Jilda Roche, DO  carvedilol (COREG) 12.5 MG tablet Take 1 tablet (12.5 mg total) by mouth 2 (two) times daily with a meal. Needs appointment for future refill / 1st attempt 10/09/22   Baldo Daub, MD  Cranberry 400 MG TABS Take 400 mg by mouth daily.    [provider]  dapagliflozin propanediol (FARXIGA) 10 MG TABS tablet Take 1 tablet (10 mg total) by mouth daily before breakfast. 08/24/22   Carmelia Roller, Jilda Roche, DO  escitalopram (LEXAPRO) 10 MG tablet Take 1 tablet (10 mg total) by mouth daily. 03/30/22   Wendling, Jilda Roche, DO   Krill Oil 1000 MG CAPS Take 1,000 mg by mouth daily.    [provider]  lamoTRIgine (LAMICTAL) 100 MG tablet Take 100 mg by mouth daily. 06/26/22   [provider]  Multiple Vitamin (MULTIVITAMIN) capsule Take 1 capsule by mouth daily.    [provider]  nitroGLYCERIN (NITROSTAT) 0.4 MG SL tablet Place 1 tablet (0.4 mg total) under the tongue every 5 (five) minutes as needed for chest pain. 02/02/20 02/23/21  Tobb, Kardie, DO  sacubitril-valsartan (ENTRESTO) 24-26 MG Take 1 tablet by mouth 2 (two) times daily. Needs appointment for future refill 10/09/22   Baldo Daub, MD  Semaglutide, 1 MG/DOSE, (OZEMPIC, 1 MG/DOSE,) 4 MG/3ML SOPN DIAL AND INJECT UNDER THE SKIN 1 MG WEEKLY 08/24/22   Sharlene Dory, DO  spironolactone (ALDACTONE) 25 MG tablet TAKE 1 TABLET BY MOUTH DAILY 10/08/22   Sharlene Dory, DO  torsemide (DEMADEX) 20 MG tablet Take 1 tablet (20 mg total) by mouth daily. 09/19/22   Sharlene Dory, DO      Allergies    Patient has no known allergies.    Review of Systems   Review of Systems  Constitutional:  Positive for fever.  Respiratory:  Negative for wheezing and stridor.   Gastrointestinal:  Positive for abdominal pain and nausea.  Genitourinary:  Positive for dysuria and frequency.  All other systems reviewed and are negative.   Physical Exam  Updated Vital Signs BP 130/81   Pulse (!) 56   Temp 98 F (36.7 C)   Resp 14   Ht 5\' 3"  (1.6 m)   Wt 75.3 kg   SpO2 100%   BMI 29.41 kg/m  Physical Exam Vitals and nursing note reviewed.  Constitutional:      General: She is not in acute distress.    Appearance: Normal appearance. She is well-developed.  HENT:     Head: Normocephalic and atraumatic.     Nose: Nose normal.  Eyes:     Pupils: Pupils are equal, round, and reactive to light.  Cardiovascular:     Rate and Rhythm: Normal rate and regular rhythm.     Pulses: Normal pulses.     Heart sounds: Normal heart  sounds.  Pulmonary:     Effort: Pulmonary effort is normal. No respiratory distress.     Breath sounds: Normal breath sounds.  Abdominal:     General: Bowel sounds are normal. There is no distension.     Palpations: Abdomen is soft.     Tenderness: There is no abdominal tenderness. There is no guarding or rebound.  Genitourinary:    Vagina: No vaginal discharge.  Musculoskeletal:        General: Normal range of motion.     Cervical back: Normal range of motion and neck supple.  Skin:    General: Skin is warm and dry.     Capillary Refill: Capillary refill takes less than 2 seconds.     Findings: No erythema or rash.  Neurological:     General: No focal deficit present.     Mental Status: She is alert and oriented to person, place, and time.     Deep Tendon Reflexes: Reflexes normal.  Psychiatric:        Mood and Affect: Mood normal.     ED Results / Procedures / Treatments   Labs (all labs ordered are listed, but only abnormal results are displayed) Results for orders placed or performed during the hospital encounter of 10/10/22  Lipase, blood  Result Value Ref Range   Lipase 35 11 - 51 U/L  Comprehensive metabolic panel  Result Value Ref Range   Sodium 134 (L) 135 - 145 mmol/L   Potassium 3.3 (L) 3.5 - 5.1 mmol/L   Chloride 99 98 - 111 mmol/L   CO2 25 22 - 32 mmol/L   Glucose, Bld 139 (H) 70 - 99 mg/dL   BUN 15 6 - 20 mg/dL   Creatinine, Ser 5.78 0.44 - 1.00 mg/dL   Calcium 8.5 (L) 8.9 - 10.3 mg/dL   Total Protein 7.5 6.5 - 8.1 g/dL   Albumin 4.1 3.5 - 5.0 g/dL   AST 15 15 - 41 U/L   ALT 15 0 - 44 U/L   Alkaline Phosphatase 70 38 - 126 U/L   Total Bilirubin 0.7 0.3 - 1.2 mg/dL   GFR, Estimated >46 >96 mL/min   Anion gap 10 5 - 15  CBC  Result Value Ref Range   WBC 6.0 4.0 - 10.5 K/uL   RBC 4.65 3.87 - 5.11 MIL/uL   Hemoglobin 13.9 12.0 - 15.0 g/dL   HCT 29.5 28.4 - 13.2 %   MCV 90.5 80.0 - 100.0 fL   MCH 29.9 26.0 - 34.0 pg   MCHC 33.0 30.0 - 36.0 g/dL    RDW 44.0 10.2 - 72.5 %   Platelets 260 150 - 400 K/uL   nRBC 0.0 0.0 -  0.2 %  Urinalysis, Routine w reflex microscopic -Urine, Clean Catch  Result Value Ref Range   Color, Urine YELLOW YELLOW   APPearance HAZY (A) CLEAR   Specific Gravity, Urine 1.020 1.005 - 1.030   pH 5.5 5.0 - 8.0   Glucose, UA >=500 (A) NEGATIVE mg/dL   Hgb urine dipstick NEGATIVE NEGATIVE   Bilirubin Urine NEGATIVE NEGATIVE   Ketones, ur NEGATIVE NEGATIVE mg/dL   Protein, ur NEGATIVE NEGATIVE mg/dL   Nitrite NEGATIVE NEGATIVE   Leukocytes,Ua NEGATIVE NEGATIVE  Pregnancy, urine  Result Value Ref Range   Preg Test, Ur NEGATIVE NEGATIVE  Lactic acid, plasma  Result Value Ref Range   Lactic Acid, Venous 1.3 0.5 - 1.9 mmol/L  Urinalysis, Microscopic (reflex)  Result Value Ref Range   RBC / HPF NONE SEEN 0 - 5 RBC/hpf   WBC, UA 0-5 0 - 5 WBC/hpf   Bacteria, UA MANY (A) NONE SEEN   Squamous Epithelial / HPF 6-10 0 - 5 /HPF   CT Renal Stone Study  Result Date: 10/10/2022 CLINICAL DATA:  Abdominal pain for 2 days EXAM: CT ABDOMEN AND PELVIS WITHOUT CONTRAST TECHNIQUE: Multidetector CT imaging of the abdomen and pelvis was performed following the standard protocol without IV contrast. RADIATION DOSE REDUCTION: This exam was performed according to the departmental dose-optimization program which includes automated exposure control, adjustment of the mA and/or kV according to patient size and/or use of iterative reconstruction technique. COMPARISON:  09/29/2021 FINDINGS: Lower chest: Lung bases are free of acute infiltrate or sizable effusion. Hepatobiliary: No focal liver abnormality is seen. No gallstones, gallbladder wall thickening, or biliary dilatation. Pancreas: Unremarkable. No pancreatic ductal dilatation or surrounding inflammatory changes. Spleen: Normal in size without focal abnormality. Adrenals/Urinary Tract: Adrenal glands are within normal limits. No renal calculi or obstructive changes are seen. The ureters  are within normal limits. Bladder is partially distended. Stomach/Bowel: No obstructive or inflammatory changes of the colon are noted. The appendix is air-filled and within normal limits. Small bowel and stomach are unremarkable. Vascular/Lymphatic: Aortic atherosclerosis. No enlarged abdominal or pelvic lymph nodes. Reproductive: Uterus and bilateral adnexa are unremarkable. Other: No abdominal wall hernia or abnormality. No abdominopelvic ascites. Musculoskeletal: No acute or significant osseous findings. IMPRESSION: No acute abnormality to correspond with the given clinical history. Electronically Signed   By: Alcide Clever M.D.   On: 10/10/2022 23:45     Radiology CT Renal Stone Study  Result Date: 10/10/2022 CLINICAL DATA:  Abdominal pain for 2 days EXAM: CT ABDOMEN AND PELVIS WITHOUT CONTRAST TECHNIQUE: Multidetector CT imaging of the abdomen and pelvis was performed following the standard protocol without IV contrast. RADIATION DOSE REDUCTION: This exam was performed according to the departmental dose-optimization program which includes automated exposure control, adjustment of the mA and/or kV according to patient size and/or use of iterative reconstruction technique. COMPARISON:  09/29/2021 FINDINGS: Lower chest: Lung bases are free of acute infiltrate or sizable effusion. Hepatobiliary: No focal liver abnormality is seen. No gallstones, gallbladder wall thickening, or biliary dilatation. Pancreas: Unremarkable. No pancreatic ductal dilatation or surrounding inflammatory changes. Spleen: Normal in size without focal abnormality. Adrenals/Urinary Tract: Adrenal glands are within normal limits. No renal calculi or obstructive changes are seen. The ureters are within normal limits. Bladder is partially distended. Stomach/Bowel: No obstructive or inflammatory changes of the colon are noted. The appendix is air-filled and within normal limits. Small bowel and stomach are unremarkable. Vascular/Lymphatic:  Aortic atherosclerosis. No enlarged abdominal or pelvic lymph nodes. Reproductive: Uterus and  bilateral adnexa are unremarkable. Other: No abdominal wall hernia or abnormality. No abdominopelvic ascites. Musculoskeletal: No acute or significant osseous findings. IMPRESSION: No acute abnormality to correspond with the given clinical history. Electronically Signed   By: Alcide Clever M.D.   On: 10/10/2022 23:45    Procedures Procedures    Medications Ordered in ED Medications  ketorolac (TORADOL) 30 MG/ML injection 15 mg (15 mg Intravenous Given 10/10/22 2310)  ondansetron (ZOFRAN) injection 4 mg (4 mg Intravenous Given 10/10/22 2309)  cephALEXin (KEFLEX) capsule 500 mg (500 mg Oral Given 10/11/22 0045)  phenazopyridine (PYRIDIUM) tablet 200 mg (200 mg Oral Given 10/11/22 0046)    ED Course/ Medical Decision Making/ A&P                             Medical Decision Making Patient with pain and dysuria and cloudy urine   Amount and/or Complexity of Data Reviewed Independent Historian: spouse    Details: See above  External Data Reviewed: notes.    Details: Previous notes reviewed  Labs: ordered.    Details: Urine is positive for UTI sodium slight low 134, potassium low 3.3 normal creatinine. Normal white count 6, normal hemoglobin 13.9, normal platelets  Radiology: ordered and independent interpretation performed.    Details: Negative CT by me   Risk Prescription drug management. Risk Details: Patient not septic with UTI.  Will treat for same.  Stable for discharge.  Follow up with PMD stable for discharge.     Final Clinical Impression(s) / ED Diagnoses Final diagnoses:  Lower urinary tract infectious disease  Return for intractable cough, coughing up blood, fevers > 100.4 unrelieved by medication, shortness of breath, intractable vomiting, chest pain, shortness of breath, weakness, numbness, changes in speech, facial asymmetry, abdominal pain, passing out, Inability to tolerate  liquids or food, cough, altered mental status or any concerns. No signs of systemic illness or infection. The patient is nontoxic-appearing on exam and vital signs are within normal limits.  I have reviewed the triage vital signs and the nursing notes. Pertinent labs & imaging results that were available during my care of the patient were reviewed by me and considered in my medical decision making (see chart for details). After history, exam, and medical workup I feel the patient has been appropriately medically screened and is safe for discharge home. Pertinent diagnoses were discussed with the patient. Patient was given return precautions  Rx / DC Orders ED Discharge Orders          Ordered    cephALEXin (KEFLEX) 500 MG capsule  4 times daily        10/11/22 0136    phenazopyridine (PYRIDIUM) 200 MG tablet  3 times daily        10/11/22 0136              Marcelline Temkin, MD 10/11/22 0140

## 2022-11-20 ENCOUNTER — Encounter: Payer: Self-pay | Admitting: Family Medicine

## 2022-11-20 ENCOUNTER — Ambulatory Visit (INDEPENDENT_AMBULATORY_CARE_PROVIDER_SITE_OTHER): Payer: 59 | Admitting: Family Medicine

## 2022-11-20 VITALS — BP 131/78 | HR 88 | Temp 98.6°F | Ht 63.0 in | Wt 165.5 lb

## 2022-11-20 DIAGNOSIS — Z0000 Encounter for general adult medical examination without abnormal findings: Secondary | ICD-10-CM | POA: Diagnosis not present

## 2022-11-20 DIAGNOSIS — E119 Type 2 diabetes mellitus without complications: Secondary | ICD-10-CM

## 2022-11-20 LAB — COMPREHENSIVE METABOLIC PANEL
ALT: 13 U/L (ref 0–35)
AST: 14 U/L (ref 0–37)
Albumin: 4 g/dL (ref 3.5–5.2)
Alkaline Phosphatase: 66 U/L (ref 39–117)
BUN: 11 mg/dL (ref 6–23)
CO2: 26 mEq/L (ref 19–32)
Calcium: 9 mg/dL (ref 8.4–10.5)
Chloride: 105 mEq/L (ref 96–112)
Creatinine, Ser: 0.68 mg/dL (ref 0.40–1.20)
GFR: 96.59 mL/min (ref >60.00)
Glucose, Bld: 138 mg/dL — ABNORMAL HIGH (ref 70–99)
Potassium: 4.1 mEq/L (ref 3.5–5.1)
Sodium: 138 mEq/L (ref 135–145)
Total Bilirubin: 0.5 mg/dL (ref 0.2–1.2)
Total Protein: 6.5 g/dL (ref 6.0–8.3)

## 2022-11-20 LAB — CBC
HCT: 38.6 % (ref 36.0–46.0)
Hemoglobin: 12.6 g/dL (ref 12.0–15.0)
MCHC: 32.6 g/dL (ref 30.0–36.0)
MCV: 91.9 fl (ref 78.0–100.0)
Platelets: 205 10*3/uL (ref 150.0–400.0)
RBC: 4.2 Mil/uL (ref 3.87–5.11)
RDW: 14.4 % (ref 11.5–15.5)
WBC: 5.2 10*3/uL (ref 4.0–10.5)

## 2022-11-20 LAB — LIPID PANEL
Cholesterol: 136 mg/dL (ref 0–200)
HDL: 58.6 mg/dL (ref >39.00)
LDL Cholesterol: 56 mg/dL (ref 0–99)
NonHDL: 77.86
Total CHOL/HDL Ratio: 2
Triglycerides: 108 mg/dL (ref 0.0–149.0)
VLDL: 21.6 mg/dL (ref 0.0–40.0)

## 2022-11-20 LAB — HEMOGLOBIN A1C: Hgb A1c MFr Bld: 6.6 % — ABNORMAL HIGH (ref 4.6–6.5)

## 2022-11-20 MED ORDER — EMPAGLIFLOZIN 25 MG PO TABS
25.0000 mg | ORAL_TABLET | Freq: Every day | ORAL | 5 refills | Status: AC
Start: 2022-11-20 — End: ?

## 2022-11-20 NOTE — Progress Notes (Signed)
Chief Complaint  Patient presents with   Cough     Well Woman Karen Patrick is here for a complete physical.   Her last physical was >1 year ago.  Current diet: in general, diet is OK. Current exercise: hiking, walking, lifting wts. Weight is stable and she denies fatigue out of ordinary. Seatbelt? Yes Advanced directive? No  Health Maintenance Pap/HPV- Yes Mammogram- Yes Colon cancer screening-Yes Shingrix- Yes Tetanus- Yes Hep C screening- Yes HIV screening- Yes  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   Adjustment disorder with depressed mood 06/03/2016   Ankle fracture, right    Cardiomyopathy (HCC) 11/06/2017   Non-ischemic November 2018   Depression    Diabetes mellitus    Drug overdose    Essential hypertension 03/05/2017   Overview:  Added automatically from request for surgery [phone] Added automatically from request for surgery [phone]   Fracture of right orbital floor with routine healing 10/12/2014   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 Last Assessment & Plan:  R ankle distal fibula avulsion fracture - significantly improved.  Add theraband and balance exercises moving forward (demonstrated today).  No evidence of DVT on exam - feel her pain in lateral calf and deep popliteal region is muscular - discussed warmth, redness, swelling - if occurs to seek care.  ASO for th   Midline thoracic back pain 03/06/2017   Mixed hyperlipidemia 08/26/2019   Myocardial infarction [facility])    Nonischemic cardiomyopathy (HCC) 08/26/2019   NSVT (nonsustained ventricular tachycardia) (HCC) 07/03/2019   Obesity (BMI 30-39.9) 08/26/2019   Obstructive sleep apnea  06/26/2017   11/2017 PSG AHI: 5.4, REM AHI: 25, SpO2 nadir: 86% 04/2018 PAP PSG: CPAP 6cm  01/2019 started CPAP therapy   Other hyperlipidemia 06/06/2018   Other seasonal allergic rhinitis 03/30/2013   Formatting of this note might be different from the original. Overview:  Watery eyes and sinus pain   PAT (paroxysmal atrial tachycardia) 07/03/2019   Right ankle injury 11/02/2010   Schizoaffective disorder (HCC)    Seasonal allergies 03/30/2013   Overview:  Watery eyes and sinus pain   Severe obesity (BMI 35.0-39.9) with comorbidity (HCC) 05/27/2019   Suicidal ideation    Type 2 diabetes mellitus without complications (HCC) 02/26/2013   Urinary tract infection 01/07/2014   UTI (urinary tract infection), bacterial 01/07/2014     Past Surgical History:  Procedure Laterality Date   BACK SURGERY     CESAREAN SECTION     ENDOMETRIAL ABLATION W/ NOVASURE     FOOT FASCIOTOMY     HAMMER TOE SURGERY     KNEE ARTHROCENTESIS     LIPOMA EXCISION      Medications  Current Outpatient Medications on File Prior to Visit  Medication Sig Dispense Refill   ascorbic acid (VITAMIN C) 500 MG tablet Take 500 mg by mouth daily.     atomoxetine (STRATTERA) 60 MG capsule Take 1 capsule (60 mg total) by mouth daily. 90 capsule 2   atorvastatin (LIPITOR) 20 MG tablet TAKE ONE TABLET BY MOUTH DAILY 30 tablet 11   carvedilol (COREG) 12.5 MG tablet Take 1 tablet (12.5 mg total) by mouth 2 (two) times daily with a meal. Needs  appointment for future refill / 1st attempt 180 tablet 0   cephALEXin (KEFLEX) 500 MG capsule Take 1 capsule (500 mg total) by mouth 4 (four) times daily. 28 capsule 0   Cranberry 400 MG TABS Take 400 mg by mouth daily.     dapagliflozin propanediol (FARXIGA) 10 MG TABS tablet Take 1 tablet (10 mg total) by mouth daily before breakfast. 90 tablet 3   escitalopram (LEXAPRO) 10 MG tablet Take 1 tablet (10 mg total) by mouth daily. 90 tablet 2   Krill Oil 1000 MG CAPS Take 1,000 mg by mouth  daily.     lamoTRIgine (LAMICTAL) 100 MG tablet Take 100 mg by mouth daily.     Multiple Vitamin (MULTIVITAMIN) capsule Take 1 capsule by mouth daily.     phenazopyridine (PYRIDIUM) 200 MG tablet Take 1 tablet (200 mg total) by mouth 3 (three) times daily. 6 tablet 0   sacubitril-valsartan (ENTRESTO) 24-26 MG Take 1 tablet by mouth 2 (two) times daily. Needs appointment for future refill 60 tablet 0   Semaglutide, 1 MG/DOSE, (OZEMPIC, 1 MG/DOSE,) 4 MG/3ML SOPN DIAL AND INJECT UNDER THE SKIN 1 MG WEEKLY 3 mL 2   spironolactone (ALDACTONE) 25 MG tablet TAKE 1 TABLET BY MOUTH DAILY 90 tablet 1   torsemide (DEMADEX) 20 MG tablet Take 1 tablet (20 mg total) by mouth daily. 30 tablet 5   nitroGLYCERIN (NITROSTAT) 0.4 MG SL tablet Place 1 tablet (0.4 mg total) under the tongue every 5 (five) minutes as needed for chest pain. 90 tablet 3   Allergies No Known Allergies  Review of Systems: Constitutional:  no unexpected weight changes Eye:  no recent significant change in vision Ear/Nose/Mouth/Throat:  Ears:  no recent change in hearing Nose/Mouth/Throat:  no complaints of nasal congestion, no sore throat Cardiovascular: no chest pain Respiratory:  no shortness of breath Gastrointestinal:  no abdominal pain, no change in bowel habits GU:  Female: negative for dysuria, +pelvic pain Musculoskeletal/Extremities:  no pain of the joints Integumentary (Skin/Breast):  no abnormal skin lesions reported Neurologic:  no headaches Endocrine:  denies fatigue  Exam BP 131/78 (BP Location: Left Arm, Patient Position: Sitting, Cuff Size: Normal)   Pulse 88   Temp 98.6 F (37 C) (Oral)   Ht 5\' 3"  (1.6 m)   Wt 165 lb 8 oz (75.1 kg)   SpO2 99%   BMI 29.32 kg/m  General:  well developed, well nourished, in no apparent distress Skin:  no significant moles, warts, or growths Head:  no masses, lesions, or tenderness Eyes:  pupils equal and round, sclera anicteric without injection Ears:  canals without  lesions, TMs shiny without retraction, no obvious effusion, no erythema Nose:  nares patent, mucosa normal, and no drainage  Throat/Pharynx:  lips and gingiva without lesion; tongue and uvula midline; non-inflamed pharynx; no exudates or postnasal drainage Neck: neck supple without adenopathy, thyromegaly, or masses Lungs:  clear to auscultation, breath sounds equal bilaterally, no respiratory distress Cardio:  regular rate and rhythm, no LE edema Abdomen:  abdomen soft, TTP in left lower quadrant (she is being treated for vaginitis and UTI); bowel sounds normal; no masses or organomegaly Genital: Defer to GYN Musculoskeletal:  symmetrical muscle groups noted without atrophy or deformity Extremities:  no clubbing, cyanosis, or edema, no deformities, no skin discoloration Neuro:  gait normal; deep tendon reflexes normal and symmetric Psych: well oriented with normal range of affect and appropriate judgment/insight  Assessment and Plan  Well adult exam - Plan: CBC,  Comprehensive metabolic panel, Lipid panel  Type 2 diabetes mellitus without complication, without long-term current use of insulin (HCC) - Plan: Hemoglobin A1c, empagliflozin (JARDIANCE) 25 MG TABS tablet   Well 57 y.o. female. Counseled on diet and exercise. Has appt w ophtho coming up.  Advanced directive form provided today.  Other orders as above. Her insurance no longer covers [place].  Will add Jardiance 25 mg daily.  If not covered, we will get her on Actos 30 mg daily.  Reviewed her most recent echo which showed normal heart function. Follow up in 3-6 mo pending the above. The patient voiced understanding and agreement to the plan.  Jilda Roche [place], DO 11/20/22 9:04 AM

## 2022-11-20 NOTE — Patient Instructions (Addendum)
Give us 2-3 business days to get the results of your labs back.  ? ?Keep the diet clean and stay active. ? ?Please get me a copy of your advanced directive form at your convenience.  ? ?OK to use Debrox (peroxide) in the ear to loosen up wax. Also recommend using a bulb syringe (for removing boogers from baby's noses) to flush through warm water and vinegar (3-4:1 ratio). An alternative, though more expensive, is an elephant ear washer wax removal kit. Do not use Q-tips as this can impact wax further. ? ?Let us know if you need anything. ?

## 2023-01-05 ENCOUNTER — Other Ambulatory Visit: Payer: Self-pay | Admitting: Cardiology

## 2023-01-13 ENCOUNTER — Telehealth: Payer: 59 | Admitting: Nurse Practitioner

## 2023-01-13 DIAGNOSIS — U071 COVID-19: Secondary | ICD-10-CM

## 2023-01-13 MED ORDER — NIRMATRELVIR/RITONAVIR (PAXLOVID)TABLET
3.0000 | ORAL_TABLET | Freq: Two times a day (BID) | ORAL | 0 refills | Status: AC
Start: 2023-01-13 — End: 2023-01-18

## 2023-01-13 MED ORDER — PROMETHAZINE-DM 6.25-15 MG/5ML PO SYRP
5.0000 mL | ORAL_SOLUTION | Freq: Four times a day (QID) | ORAL | 0 refills | Status: AC | PRN
Start: 2023-01-13 — End: ?

## 2023-01-13 NOTE — Progress Notes (Signed)
Virtual Visit Consent   Karen Patrick, you are scheduled for a virtual visit with a [facility] provider today. Just as with appointments in the office, your consent must be obtained to participate. Your consent will be active for this visit and any virtual visit you may have with one of our providers in the next 365 days. If you have a MyChart account, a copy of this consent can be sent to you electronically.  As this is a virtual visit, video technology does not allow for your provider to perform a traditional examination. This may limit your provider's ability to fully assess your condition. If your provider identifies any concerns that need to be evaluated in person or the need to arrange testing (such as labs, EKG, etc.), we will make arrangements to do so. Although advances in technology are sophisticated, we cannot ensure that it will always work on either your end or our end. If the connection with a video visit is poor, the visit may have to be switched to a telephone visit. With either a video or telephone visit, we are not always able to ensure that we have a secure connection.  By engaging in this virtual visit, you consent to the provision of healthcare and authorize for your insurance to be billed (if applicable) for the services provided during this visit. Depending on your insurance coverage, you may receive a charge related to this service.  I need to obtain your verbal consent now. Are you willing to proceed with your visit today? Karen Patrick has provided verbal consent on 01/13/2023 for a virtual visit (video or telephone). [name], NP  Date: 01/13/2023 5:23 PM  Virtual Visit via Video Note   I, [name], connected with  Karen Patrick  ([id number], Aug 02, 1965) on 01/13/23 at  5:30 PM EDT by a video-enabled telemedicine application and verified that I am speaking with the correct person using two identifiers.  Location: Patient: Virtual Visit Location Patient:  Home Provider: Virtual Visit Location Provider: Home Office   I discussed the limitations of evaluation and management by telemedicine and the availability of in person appointments. The patient expressed understanding and agreed to proceed.    History of Present Illness: Karen Patrick is a 57 y.o. who identifies as a female who was assigned female at birth, and is being seen today for COVID POSITIVE symptoms.   Karen Patrick is currently experiencing the following symptoms since 2 days ago: cough, congestion, fever, headache, body aches, fatigue. She took a COVID test today and it is positive. She is interested in an antiviral.    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   NSVT (nonsustained ventricular tachycardia) (HCC) 07/03/2019   Hyperlipidemia 06/06/2018   ADHD (attention deficit hyperactivity disorder), 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   Cardiomyopathy (HCC) 11/06/2017   Obstructive sleep apnea 06/26/2017   Midline thoracic back pain 03/06/2017   Essential hypertension 03/05/2017   History of hypertension 03/05/2017   Adjustment disorder with depressed mood 06/03/2016   Drug overdose    Suicidal ideation    Fracture of right orbital floor with  routine healing 10/12/2014   Urinary tract infection 01/07/2014   UTI (urinary tract infection), bacterial 01/07/2014   Acute pancreatitis 01/06/2014   Seasonal allergies 03/30/2013   Other seasonal allergic rhinitis 03/30/2013   Type 2 diabetes mellitus with hyperglycemia, without long-term current use of insulin (HCC) 02/26/2013   Type 2 diabetes mellitus  without complications (HCC) 02/26/2013   Schizoaffective disorder (HCC) 06/04/2012   Injury of right ankle 11/02/2010   Right ankle injury 11/02/2010    Allergies: No Known Allergies Medications:  Current Outpatient Medications:    nirmatrelvir/ritonavir (PAXLOVID) 20 x 150 MG & 10 x 100MG  TABS, Take 3 tablets by mouth 2 (two) times daily for 5 days. (Take nirmatrelvir 150 mg two tablets twice daily for 5 days and ritonavir 100 mg one tablet twice daily for 5 days) Patient GFR is 98, Disp: 30 tablet, Rfl: 0   promethazine-dextromethorphan (PROMETHAZINE-DM) 6.25-15 MG/5ML syrup, Take 5 mLs by mouth 4 (four) times daily as needed for cough., Disp: 240 mL, Rfl: 0   ascorbic acid (VITAMIN C) 500 MG tablet, Take 500 mg by mouth daily., Disp: , Rfl:    atomoxetine (STRATTERA) 60 MG capsule, Take 1 capsule (60 mg total) by mouth daily., Disp: 90 capsule, Rfl: 2   atorvastatin (LIPITOR) 20 MG tablet, TAKE ONE TABLET BY MOUTH DAILY, Disp: 30 tablet, Rfl: 11   carvedilol (COREG) 12.5 MG tablet, Take 1 tablet (12.5 mg total) by mouth 2 (two) times daily with a meal. Patient needs an appointment for further refills. 2 nd attempt, Disp: 30 tablet, Rfl: 0   cephALEXin (KEFLEX) 500 MG capsule, Take 1 capsule (500 mg total) by mouth 4 (four) times daily., Disp: 28 capsule, Rfl: 0   Cranberry 400 MG TABS, Take 400 mg by mouth daily., Disp: , Rfl:    empagliflozin (JARDIANCE) 25 MG TABS tablet, Take 1 tablet (25 mg total) by mouth daily before breakfast., Disp: 30 tablet, Rfl: 5   escitalopram (LEXAPRO) 10 MG tablet, Take 1 tablet (10 mg total) by mouth daily., Disp: 90 tablet, Rfl: 2   Krill Oil 1000 MG CAPS, Take 1,000 mg by mouth daily., Disp: , Rfl:    lamoTRIgine (LAMICTAL) 100 MG tablet, Take 100 mg by mouth daily., Disp: , Rfl:    Multiple Vitamin (MULTIVITAMIN) capsule, Take 1 capsule by mouth daily., Disp: , Rfl:    nitroGLYCERIN (NITROSTAT) 0.4 MG SL tablet, Place 1 tablet (0.4 mg total) under the  tongue every 5 (five) minutes as needed for chest pain., Disp: 90 tablet, Rfl: 3   phenazopyridine (PYRIDIUM) 200 MG tablet, Take 1 tablet (200 mg total) by mouth 3 (three) times daily., Disp: 6 tablet, Rfl: 0   sacubitril-valsartan (ENTRESTO) 24-26 MG, Take 1 tablet by mouth 2 (two) times daily. Needs appointment for future refill, Disp: 60 tablet, Rfl: 0   Semaglutide, 1 MG/DOSE, (OZEMPIC, 1 MG/DOSE,) 4 MG/3ML SOPN, DIAL AND INJECT UNDER THE SKIN 1 MG WEEKLY, Disp: 3 mL, Rfl: 2   spironolactone (ALDACTONE) 25 MG tablet, TAKE 1 TABLET BY MOUTH DAILY, Disp: 90 tablet, Rfl: 1   torsemide (DEMADEX) 20 MG tablet, Take 1 tablet (20 mg total) by mouth daily., Disp: 30 tablet, Rfl: 5  Observations/Objective: Patient is well-developed, well-nourished in no acute distress.  Resting comfortably at home.  Head is normocephalic, atraumatic.  No labored breathing.  Speech is clear and coherent with logical content.  Patient is alert and oriented at baseline.    Assessment and Plan: 1. Positive self-administered antigen  test for COVID-19 - nirmatrelvir/ritonavir (PAXLOVID) 20 x 150 MG & 10 x 100MG  TABS; Take 3 tablets by mouth 2 (two) times daily for 5 days. (Take nirmatrelvir 150 mg two tablets twice daily for 5 days and ritonavir 100 mg one tablet twice daily for 5 days) Patient GFR is 98  Dispense: 30 tablet; Refill: 0 - promethazine-dextromethorphan (PROMETHAZINE-DM) 6.25-15 MG/5ML syrup; Take 5 mLs by mouth 4 (four) times daily as needed for cough.  Dispense: 240 mL; Refill: 0   Please keep well-hydrated and get plenty of rest. Start a saline nasal rinse to flush out your nasal passages. You can use plain Mucinex to help thin congestion. If you have a humidifier, you can use this daily as needed.    You are to wear a mask for 5 days from onset of your symptoms.  After day 5, if you have had no fever and you are feeling better with NO symptoms, you can end masking. Keep in mind you can be  contagious 10 days from the onset of symptoms  After day 5 if you have a fever or are having significant symptoms, please wear your mask for full 10 days.   If you note any worsening of symptoms, any significant shortness of breath or any chest pain, please seek ER evaluation ASAP.  Please do not delay care!    If you note any worsening of symptoms, any significant shortness of breath or any chest pain, please seek ER evaluation ASAP.  Please do not delay care!   Follow Up Instructions: I discussed the assessment and treatment plan with the patient. The patient was provided an opportunity to ask questions and all were answered. The patient agreed with the plan and demonstrated an understanding of the instructions.  A copy of instructions were sent to the patient via MyChart unless otherwise noted below.    The patient was advised to call back or seek an in-person evaluation if the symptoms worsen or if the condition fails to improve as anticipated.  Time:  I spent 12 minutes with the patient via telehealth technology discussing the above problems/concerns.    [name], NP

## 2023-01-13 NOTE — Patient Instructions (Signed)
[name], thank you for joining [name], NP for today's virtual visit.  While this provider is not your primary care provider (PCP), if your PCP is located in our provider database this encounter information will be shared with them immediately following your visit.   A [facility] MyChart account gives you access to today's visit and all your visits, tests, and labs performed at [facility] " click here if you don't have a [facility] MyChart account or go to [email or web address]  Consent: (Patient) Karen Patrick provided verbal consent for this virtual visit at the beginning of the encounter.  Current Medications:  Current Outpatient Medications:    nirmatrelvir/ritonavir (PAXLOVID) 20 x 150 MG & 10 x 100MG  TABS, Take 3 tablets by mouth 2 (two) times daily for 5 days. (Take nirmatrelvir 150 mg two tablets twice daily for 5 days and ritonavir 100 mg one tablet twice daily for 5 days) Patient GFR is 98, Disp: 30 tablet, Rfl: 0   promethazine-dextromethorphan (PROMETHAZINE-DM) 6.25-15 MG/5ML syrup, Take 5 mLs by mouth 4 (four) times daily as needed for cough., Disp: 240 mL, Rfl: 0   ascorbic acid (VITAMIN C) 500 MG tablet, Take 500 mg by mouth daily., Disp: , Rfl:    atomoxetine (STRATTERA) 60 MG capsule, Take 1 capsule (60 mg total) by mouth daily., Disp: 90 capsule, Rfl: 2   atorvastatin (LIPITOR) 20 MG tablet, TAKE ONE TABLET BY MOUTH DAILY, Disp: 30 tablet, Rfl: 11   carvedilol (COREG) 12.5 MG tablet, Take 1 tablet (12.5 mg total) by mouth 2 (two) times daily with a meal. Patient needs an appointment for further refills. 2 nd attempt, Disp: 30 tablet, Rfl: 0   cephALEXin (KEFLEX) 500 MG capsule, Take 1 capsule (500 mg total) by mouth 4 (four) times daily., Disp: 28 capsule, Rfl: 0   Cranberry 400 MG TABS, Take 400 mg by mouth daily., Disp: , Rfl:    empagliflozin (JARDIANCE) 25 MG TABS tablet, Take 1 tablet (25 mg total) by mouth daily before breakfast., Disp: 30  tablet, Rfl: 5   escitalopram (LEXAPRO) 10 MG tablet, Take 1 tablet (10 mg total) by mouth daily., Disp: 90 tablet, Rfl: 2   Krill Oil 1000 MG CAPS, Take 1,000 mg by mouth daily., Disp: , Rfl:    lamoTRIgine (LAMICTAL) 100 MG tablet, Take 100 mg by mouth daily., Disp: , Rfl:    Multiple Vitamin (MULTIVITAMIN) capsule, Take 1 capsule by mouth daily., Disp: , Rfl:    nitroGLYCERIN (NITROSTAT) 0.4 MG SL tablet, Place 1 tablet (0.4 mg total) under the tongue every 5 (five) minutes as needed for chest pain., Disp: 90 tablet, Rfl: 3   phenazopyridine (PYRIDIUM) 200 MG tablet, Take 1 tablet (200 mg total) by mouth 3 (three) times daily., Disp: 6 tablet, Rfl: 0   sacubitril-valsartan (ENTRESTO) 24-26 MG, Take 1 tablet by mouth 2 (two) times daily. Needs appointment for future refill, Disp: 60 tablet, Rfl: 0   Semaglutide, 1 MG/DOSE, (OZEMPIC, 1 MG/DOSE,) 4 MG/3ML SOPN, DIAL AND INJECT UNDER THE SKIN 1 MG WEEKLY, Disp: 3 mL, Rfl: 2   spironolactone (ALDACTONE) 25 MG tablet, TAKE 1 TABLET BY MOUTH DAILY, Disp: 90 tablet, Rfl: 1   torsemide (DEMADEX) 20 MG tablet, Take 1 tablet (20 mg total) by mouth daily., Disp: 30 tablet, Rfl: 5   Medications ordered in this encounter:  Meds ordered this encounter  Medications   nirmatrelvir/ritonavir (PAXLOVID) 20 x 150 MG & 10 x 100MG  TABS    Sig: Take  3 tablets by mouth 2 (two) times daily for 5 days. (Take nirmatrelvir 150 mg two tablets twice daily for 5 days and ritonavir 100 mg one tablet twice daily for 5 days) Patient GFR is 98    Dispense:  30 tablet    Refill:  0    Order Specific Question:   Supervising Provider    Answer:   Merrilee Jansky [[id number]]   promethazine-dextromethorphan (PROMETHAZINE-DM) 6.25-15 MG/5ML syrup    Sig: Take 5 mLs by mouth 4 (four) times daily as needed for cough.    Dispense:  240 mL    Refill:  0    Order Specific Question:   Supervising Provider    Answer:   Merrilee Jansky [[id number]]     *If you need refills on other  medications prior to your next appointment, please contact your pharmacy*  Follow-Up: Call back or seek an in-person evaluation if the symptoms worsen or if the condition fails to improve as anticipated.  [facility] [phone]  Other Instructions  Please keep well-hydrated and get plenty of rest. Start a saline nasal rinse to flush out your nasal passages. You can use plain Mucinex to help thin congestion. If you have a humidifier, you can use this daily as needed.    You are to wear a mask for 5 days from onset of your symptoms.  After day 5, if you have had no fever and you are feeling better with NO symptoms, you can end masking. Keep in mind you can be contagious 10 days from the onset of symptoms  After day 5 if you have a fever or are having significant symptoms, please wear your mask for full 10 days.   If you note any worsening of symptoms, any significant shortness of breath or any chest pain, please seek ER evaluation ASAP.  Please do not delay care!    If you note any worsening of symptoms, any significant shortness of breath or any chest pain, please seek ER evaluation ASAP.  Please do not delay care!    If you have been instructed to have an in-person evaluation today at a local Urgent Care facility, please use the link below. It will take you to a list of all of our available [facility], including address, phone number and hours of operation. Please do not delay care.  [facility]  If you or a family member do not have a primary care provider, use the link below to schedule a visit and establish care. When you choose a [facility] primary care physician or advanced practice provider, you gain a long-term partner in health. Find a Primary Care Provider  Learn more about [facility]'s in-office and virtual care options: [facility] - Get Care Now

## 2023-01-24 ENCOUNTER — Other Ambulatory Visit: Payer: Self-pay | Admitting: Cardiology

## 2023-02-05 ENCOUNTER — Other Ambulatory Visit: Payer: Self-pay | Admitting: Cardiology

## 2023-02-05 NOTE — Telephone Encounter (Signed)
Rx refill sent to pharmacy. 

## 2023-02-07 ENCOUNTER — Other Ambulatory Visit: Payer: Self-pay | Admitting: Family Medicine

## 2023-02-07 DIAGNOSIS — E1165 Type 2 diabetes mellitus with hyperglycemia: Secondary | ICD-10-CM

## 2023-02-18 ENCOUNTER — Other Ambulatory Visit: Payer: Self-pay | Admitting: Cardiology

## 2023-02-18 MED ORDER — ENTRESTO 24-26 MG PO TABS: 1.0000 | ORAL_TABLET | Freq: Two times a day (BID) | ORAL | 0 refills | Status: DC

## 2023-02-20 ENCOUNTER — Other Ambulatory Visit: Payer: Self-pay | Admitting: Cardiology

## 2023-02-22 DIAGNOSIS — R5382 Chronic fatigue, unspecified: Secondary | ICD-10-CM

## 2023-02-22 DIAGNOSIS — R739 Hyperglycemia, unspecified: Secondary | ICD-10-CM

## 2023-02-22 DIAGNOSIS — R4589 Other symptoms and signs involving emotional state: Secondary | ICD-10-CM | POA: Insufficient documentation

## 2023-02-22 DIAGNOSIS — R6882 Decreased libido: Secondary | ICD-10-CM | POA: Insufficient documentation

## 2023-02-22 DIAGNOSIS — I509 Heart failure, unspecified: Secondary | ICD-10-CM | POA: Insufficient documentation

## 2023-02-22 DIAGNOSIS — M2559 Pain in other specified joint: Secondary | ICD-10-CM

## 2023-02-22 DIAGNOSIS — N951 Menopausal and female climacteric states: Secondary | ICD-10-CM

## 2023-02-22 DIAGNOSIS — Z780 Asymptomatic menopausal state: Secondary | ICD-10-CM

## 2023-02-22 HISTORY — DX: Heart failure, unspecified: I50.9

## 2023-02-22 HISTORY — DX: Decreased libido: R68.82

## 2023-02-22 HISTORY — DX: Asymptomatic menopausal state: Z78.0

## 2023-02-22 HISTORY — DX: Chronic fatigue, unspecified: R53.82

## 2023-02-22 HISTORY — DX: Hyperglycemia, unspecified: R73.9

## 2023-02-22 HISTORY — DX: Pain in other specified joint: M25.59

## 2023-02-22 HISTORY — DX: Other symptoms and signs involving emotional state: R45.89

## 2023-02-22 HISTORY — DX: Menopausal and female climacteric states: N95.1

## 2023-02-25 ENCOUNTER — Other Ambulatory Visit (HOSPITAL_BASED_OUTPATIENT_CLINIC_OR_DEPARTMENT_OTHER): Payer: Self-pay

## 2023-02-25 ENCOUNTER — Ambulatory Visit: Payer: 59

## 2023-02-25 VITALS — BP 104/66 | HR 80 | Ht 63.0 in | Wt 153.1 lb

## 2023-02-25 DIAGNOSIS — I428 Other cardiomyopathies: Secondary | ICD-10-CM | POA: Diagnosis not present

## 2023-02-25 DIAGNOSIS — I5042 Chronic combined systolic (congestive) and diastolic (congestive) heart failure: Secondary | ICD-10-CM | POA: Diagnosis not present

## 2023-02-25 DIAGNOSIS — I10 Essential (primary) hypertension: Secondary | ICD-10-CM | POA: Diagnosis not present

## 2023-02-25 DIAGNOSIS — I493 Ventricular premature depolarization: Secondary | ICD-10-CM

## 2023-02-25 MED ORDER — ENTRESTO 24-26 MG PO TABS: 1.0000 | ORAL_TABLET | Freq: Two times a day (BID) | ORAL | 3 refills | Status: DC

## 2023-02-25 MED ORDER — NITROGLYCERIN 0.4 MG SL SUBL
0.4000 mg | SUBLINGUAL_TABLET | SUBLINGUAL | 11 refills | Status: AC | PRN
  Filled 2023-02-25: qty 25, 1d supply, fill #0

## 2023-02-25 NOTE — Patient Instructions (Signed)
Medication Instructions:  Your physician recommends that you continue on your current medications as directed. Please refer to the Current Medication list given to you today.  *If you need a refill on your cardiac medications before your next appointment, please call your pharmacy*   Lab Work: None ordered If you have labs (blood work) drawn today and your tests are completely normal, you will receive your results only by: MyChart Message (if you have MyChart) OR A paper copy in the mail If you have any lab test that is abnormal or we need to change your treatment, we will call you to review the results.   Testing/Procedures: Your physician has requested that you have an echocardiogram. Echocardiography is a painless test that uses sound waves to create images of your heart. It provides your doctor with information about the size and shape of your heart and how well your heart's chambers and valves are working. This procedure takes approximately one hour. There are no restrictions for this procedure. Please do NOT wear cologne, perfume, aftershave, or lotions (deodorant is allowed). Please arrive 15 minutes prior to your appointment time.     Follow-Up: At [facility], you and your health needs are our priority.  As part of our continuing mission to provide you with exceptional heart care, we have created designated Provider Care Teams.  These Care Teams include your primary Cardiologist (physician) and Advanced Practice Providers (APPs -  Physician Assistants and Nurse Practitioners) who all work together to provide you with the care you need, when you need it.  We recommend signing up for the patient portal called "MyChart".  Sign up information is provided on this After Visit Summary.  MyChart is used to connect with patients for Virtual Visits (Telemedicine).  Patients are able to view lab/test results, encounter notes, upcoming appointments, etc.  Non-urgent messages can be sent to  your provider as well.   To learn more about what you can do with MyChart, go to [email or web address].    Your next appointment:   12 month(s)  The format for your next appointment:   In Person  Provider:   Vern Claude Madireddy, MD   Other Instructions Echocardiogram An echocardiogram is a test that uses sound waves (ultrasound) to produce images of the heart. Images from an echocardiogram can provide important information about: Heart size and shape. The size and thickness and movement of your heart's walls. Heart muscle function and strength. Heart valve function or if you have stenosis. Stenosis is when the heart valves are too narrow. If blood is flowing backward through the heart valves (regurgitation). A tumor or infectious growth around the heart valves. Areas of heart muscle that are not working well because of poor blood flow or injury from a heart attack. Aneurysm detection. An aneurysm is a weak or damaged part of an artery wall. The wall bulges out from the normal force of blood pumping through the body. Tell a health care provider about: Any allergies you have. All medicines you are taking, including vitamins, herbs, eye drops, creams, and over-the-counter medicines. Any blood disorders you have. Any surgeries you have had. Any medical conditions you have. Whether you are pregnant or may be pregnant. What are the risks? Generally, this is a safe test. However, problems may occur, including an allergic reaction to dye (contrast) that may be used during the test. What happens before the test? No specific preparation is needed. You may eat and drink normally. What happens during the test? You will  take off your clothes from the waist up and put on a hospital gown. Electrodes or electrocardiogram (ECG)patches may be placed on your chest. The electrodes or patches are then connected to a device that monitors your heart rate and rhythm. You will lie down on a table for  an ultrasound exam. A gel will be applied to your chest to help sound waves pass through your skin. A handheld device, called a transducer, will be pressed against your chest and moved over your heart. The transducer produces sound waves that travel to your heart and bounce back (or "echo" back) to the transducer. These sound waves will be captured in real-time and changed into images of your heart that can be viewed on a video monitor. The images will be recorded on a computer and reviewed by your health care provider. You may be asked to change positions or hold your breath for a short time. This makes it easier to get different views or better views of your heart. In some cases, you may receive contrast through an IV in one of your veins. This can improve the quality of the pictures from your heart. The procedure may vary among health care providers and hospitals.   What can I expect after the test? You may return to your normal, everyday life, including diet, activities, and medicines, unless your health care provider tells you not to do that. Follow these instructions at home: It is up to you to get the results of your test. Ask your health care provider, or the department that is doing the test, when your results will be ready. Keep all follow-up visits. This is important. Summary An echocardiogram is a test that uses sound waves (ultrasound) to produce images of the heart. Images from an echocardiogram can provide important information about the size and shape of your heart, heart muscle function, heart valve function, and other possible heart problems. You do not need to do anything to prepare before this test. You may eat and drink normally. After the echocardiogram is completed, you may return to your normal, everyday life, unless your health care provider tells you not to do that. This information is not intended to replace advice given to you by your health care provider. Make sure you  discuss any questions you have with your health care provider. Document Revised: 12/08/2019 Document Reviewed: 12/08/2019 Elsevier Patient Education  2021 Elsevier Inc.   Important Information About Sugar

## 2023-02-25 NOTE — Assessment & Plan Note (Addendum)
Nonischemic cardiomyopathy. Doing well. On guideline directed medical therapy with carvedilol 12.5 mg twice daily Entresto 24 mg / 26 mgTwice daily Jardiance 25 mg once a day diabetic dose Spironolactone 25 mg once daily.  Refill for [facility] sent.  She has refills for the rest of the medications.  Genetic counseling that was recommended at last visit did not happen due to insurance issues she is requesting this to be done.  Will send out 1.  Will repeat transthoracic echocardiogram for interval assessment of cardiac structure and function.

## 2023-02-25 NOTE — Assessment & Plan Note (Addendum)
Asymptomatic. Frequent PVCs bigeminy pattern. Was unable to proceed with cardiac MRI due to claustrophobia Continue with carvedilol 12.5 mg twice daily.  Advised to avoid stimulants that she was taking to keep up with the job [was consuming red bull intermittently and drinking excess caffeine drinks]  If any significant change on echocardiogram showing reduced LV function, will further escalate carvedilol dose and consider additional antiarrhythmics.

## 2023-02-25 NOTE — Assessment & Plan Note (Signed)
Euvolemic, compensated, NYHA class I. Aware of salt and fluid restriction. Aware of daily weight monitoring and to take extra doses of torsemide if weight goes up by 2 pounds within a day or 5 pounds within a week. Had taken torsemide couple times in the last 1 month.

## 2023-02-25 NOTE — Progress Notes (Signed)
Cardiology Consultation:    Date:  02/25/2023   ID:  Karen Patrick, DOB 01/28/1966, MRN [id number]  PCP:  Sharlene Dory, DO  Cardiologist:  Marlyn Corporal General Wearing, MD   Referring MD: Sharlene Dory*   No chief complaint on file.    ASSESSMENT AND PLAN:   57 year old woman with nonischemic cardiomyopathy with previously reduced EF down to 40%, subsequently recovered and on last echocardiogram 45 to 50% in January 2023, CAD workup with cath in 2018 and subsequently CT coronary angiogram in October 2021 with calcium score 0 and no obstructive disease, unable to do cardiac MRI due to claustrophobia, diabetes mellitus, hypertension, hyperlipidemia, frequent PVCs, doing well, keeping up with her activities, here for routine follow-up visit. She w tells me that genetic counseling did not happen due to insurance issues and the past.  She did not want [place] to place a new consult at this time.   Problem List Items Addressed This Visit     Essential hypertension    Well-controlled. Continue target below 130 over 80 mmHg.       Relevant Medications   nitroGLYCERIN (NITROSTAT) 0.4 MG SL tablet   sacubitril-valsartan (ENTRESTO) 24-26 MG   Frequent PVCs    Asymptomatic. Frequent PVCs bigeminy pattern. Was unable to proceed with cardiac MRI due to claustrophobia Continue with carvedilol 12.5 mg twice daily.  Advised to avoid stimulants that she was taking to keep up with the job [was consuming red bull intermittently and drinking excess caffeine drinks]  If any significant change on echocardiogram showing reduced LV function, will further escalate carvedilol dose and consider additional antiarrhythmics.       Relevant Medications   nitroGLYCERIN (NITROSTAT) 0.4 MG SL tablet   sacubitril-valsartan (ENTRESTO) 24-26 MG   Nonischemic cardiomyopathy (HCC)    Nonischemic cardiomyopathy. Doing well. On guideline directed medical therapy with carvedilol 12.5 mg twice  daily Entresto 24 mg / 26 mgTwice daily Jardiance 25 mg once a day diabetic dose Spironolactone 25 mg once daily.  Refill for [facility] sent.  She has refills for the rest of the medications.  Genetic counseling that was recommended at last visit did not happen due to insurance issues she is requesting this to be done.  Will send out 1.  Will repeat transthoracic echocardiogram for interval assessment of cardiac structure and function.        Relevant Medications   nitroGLYCERIN (NITROSTAT) 0.4 MG SL tablet   sacubitril-valsartan (ENTRESTO) 24-26 MG   Other Relevant Orders   EKG 12-Lead (Completed)   ECHOCARDIOGRAM COMPLETE   Ambulatory referral to Genetics   CHF (congestive heart failure) (HCC) - Primary    Euvolemic, compensated, NYHA class I. Aware of salt and fluid restriction. Aware of daily weight monitoring and to take extra doses of torsemide if weight goes up by 2 pounds within a day or 5 pounds within a week. Had taken torsemide couple times in the last 1 month.       Relevant Medications   nitroGLYCERIN (NITROSTAT) 0.4 MG SL tablet   sacubitril-valsartan (ENTRESTO) 24-26 MG   Return to clinic in 1 year or as needed.   History of Present Illness:    Karen Patrick is a 57 y.o. female who is being seen today for the evaluation of non-ST cardiomyopathy, chronic CHF. Was previously seen by Dr. Dulce Sellar at our office, last visit April 2023.  Has history of nonischemic cardiomyopathy with preserved EF, diabetes mellitus, hypertension, hyperlipidemia, frequent PVCs with occasional nonsustained ventricular tachycardia,  significant family history of heart disease with heart failure [mother died in her 83s, sister with cardiomyopathy, father with atrial fibrillation].  Had ischemic workup with coronary angiogram in 2018 at [facility] that showed no obstructive disease, EF was 40%. Evaluation for CAD was with cardiac CTA October 2021 that showed calcium  score 0 and no evidence of obstructive CAD. Unable to do cardiac MRI due to claustrophobia. Event monitor in 2021 with frequent PVCs 6.8% burden and 2 short runs of NSVT.  Here for the visit by herself.  Pleasant woman lives with her boyfriend at home. 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  Mentions work has been stressful and she has not been exercising.  Able to do her job without any limitations.  Was in the habit of consuming excess caffeinated drinks to keep her going with her job.  Couple month ago realized her blood pressures were running high and noted she was off Entresto failure to refill the medication for few months.  She has resumed the medication since realizing the mistake.  Occasionally takes torsemide as needed for any ankle swelling.  Taken 2 doses in the last month.  No chest pain.  Has not had to take any sublingual nitroglycerin.  Requesting refill of sublingual nitroglycerin just to have them handy.  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   Adjustment disorder with depressed mood 06/03/2016   Ankle fracture, right    Asthma    Cardiomyopathy (HCC) 11/06/2017   Non-ischemic November 2018   CHF (congestive heart failure) (HCC) 02/22/2023   Chronic fatigue 02/22/2023   Depressed mood 02/22/2023   Depression    Diabetes mellitus (HCC) 03/05/2017   Drug overdose    Essential hypertension 03/05/2017   Overview:  Added automatically from request for  surgery [id number] Added automatically from request for surgery [phone]   Fracture of right orbital floor with routine healing 10/12/2014   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   GERD (gastroesophageal reflux disease)    History of hypertension 03/05/2017   Hyperglycemia 02/22/2023   Hyperlipidemia 06/06/2018   Injury of right ankle 11/02/2010   Formatting of this note might be different from the original. Last Assessment & Plan:  R ankle distal fibula avulsion fracture - significantly improved.  Add theraband and balance exercises moving forward (demonstrated today).  No evidence of DVT on exam - feel her pain in lateral calf and deep popliteal region is muscular - discussed warmth, redness, swelling - if occurs to seek care.  ASO for th   Menopausal and female climacteric states 02/22/2023   Menopause 02/22/2023   Midline thoracic back pain 03/06/2017   Mixed hyperlipidemia 08/26/2019   Myocardial infarction [facility])    Nephrolithiasis 08/18/2022   Nonischemic cardiomyopathy (HCC) 08/26/2019   NSVT (nonsustained ventricular tachycardia) (HCC) 07/03/2019   Obesity (BMI 30-39.9) 08/26/2019   Obstructive sleep apnea 06/26/2017   11/2017 PSG AHI: 5.4, REM AHI: 25, SpO2 nadir: 86% 04/2018 PAP PSG: CPAP 6cm  01/2019 started CPAP therapy   Other seasonal allergic  rhinitis 03/30/2013   Formatting of this note might be different from the original. Overview:  Watery eyes and sinus pain   Pain in other specified joint 02/22/2023   PAT (paroxysmal atrial tachycardia) (HCC) 07/03/2019   Reduced libido 02/22/2023   Right ankle injury 11/02/2010   Schizoaffective disorder (HCC)    Seasonal allergies 03/30/2013   Overview:  Watery eyes and sinus pain   Suicidal ideation    Type 2 diabetes mellitus with hyperglycemia, without long-term current use of insulin (HCC) 02/26/2013   Type 2 diabetes mellitus without complications (HCC) 02/26/2013   Urinary tract infection 01/07/2014   UTI (urinary tract infection), bacterial  01/07/2014    Past Surgical History:  Procedure Laterality Date   BACK SURGERY     CESAREAN SECTION     ENDOMETRIAL ABLATION W/ NOVASURE     FOOT FASCIOTOMY     HAMMER TOE SURGERY     KNEE ARTHROCENTESIS     LIPOMA EXCISION      Current Medications: Current Meds  Medication Sig   ascorbic acid (VITAMIN C) 500 MG tablet Take 500 mg by mouth daily.   atomoxetine (STRATTERA) 60 MG capsule Take 1 capsule (60 mg total) by mouth daily.   atorvastatin (LIPITOR) 20 MG tablet TAKE ONE TABLET BY MOUTH DAILY   carvedilol (COREG) 12.5 MG tablet Take 1 tablet (12.5 mg total) by mouth 2 (two) times daily with a meal. Final attempt, patient needs and appt for additional refills   Cranberry 400 MG TABS Take 400 mg by mouth daily.   empagliflozin (JARDIANCE) 25 MG TABS tablet Take 1 tablet (25 mg total) by mouth daily before breakfast.   Krill Oil 1000 MG CAPS Take 1,000 mg by mouth daily.   lamoTRIgine (LAMICTAL) 100 MG tablet Take 100 mg by mouth daily.   OZEMPIC, 1 MG/DOSE, 4 MG/3ML SOPN DIAL AND INJECT UNDER THE SKIN 1 MG WEEKLY   promethazine-dextromethorphan (PROMETHAZINE-DM) 6.25-15 MG/5ML syrup Take 5 mLs by mouth 4 (four) times daily as needed for cough.   sacubitril-valsartan (ENTRESTO) 24-26 MG Take 1 tablet by mouth 2 (two) times daily. 2nd attempt Needs appointment for future refill   spironolactone (ALDACTONE) 25 MG tablet TAKE 1 TABLET BY MOUTH DAILY   torsemide (DEMADEX) 20 MG tablet Take 1 tablet (20 mg total) by mouth daily.   [DISCONTINUED] nitroGLYCERIN (NITROSTAT) 0.4 MG SL tablet Place 0.4 mg under the tongue every 5 (five) minutes as needed for chest pain.   [DISCONTINUED] sacubitril-valsartan (ENTRESTO) 24-26 MG Take 1 tablet by mouth 2 (two) times daily. 2nd attempt Needs appointment for future refill     Allergies:   Patient has no known allergies.   Social History   Socioeconomic History   Marital status: Legally Separated    Spouse name: Not on file   Number of  children: Not on file   Years of education: Not on file   Highest education level: Not on file  Occupational History   Not on file  Tobacco Use   Smoking status: Former    Current packs/day: 0.00    Average packs/day: 1.5 packs/day for 23.0 years (34.5 ttl pk-yrs)    Types: Cigarettes    Start date: 85    Quit date: 2006    Years since quitting: 18.8   Smokeless tobacco: Never  Vaping Use   Vaping status: Never Used  Substance and Sexual Activity   Alcohol use: Yes   Drug use: Yes    Types: Marijuana  Comment: occ   Sexual activity: Yes    Birth control/protection: Surgical  Other Topics Concern   Not on file  Social History Narrative   Not on file   Social Determinants of Health   Financial Resource Strain: Not on file  Food Insecurity: Not on file  Transportation Needs: Not on file  Physical Activity: Not on file  Stress: Not on file  Social Connections: Unknown (09/10/2021)   Received from [facility]   Social Network    Social Network: Not on file     Family History: The patient's family history includes Breast cancer in her maternal aunt, maternal aunt, maternal aunt, and paternal aunt; Cancer in her mother and paternal aunt; Diabetes in her father, maternal grandmother, mother, and paternal grandmother; Heart attack in her father and maternal grandmother; Heart failure in her mother; Hyperlipidemia in her father; Hypertension in her father, maternal grandmother, and mother; Stroke in her father and mother; Sudden death in her father and paternal grandmother. ROS:   Please see the history of present illness.    All 14 point review of systems negative except as described per history of present illness.  EKGs/Labs/Other Studies Reviewed:    The following studies were reviewed today:   EKG:  EKG Interpretation Date/Time:  Monday February 25 2023 13:26:41 EDT Ventricular Rate:  80 PR Interval:  152 QRS Duration:  84 QT Interval:  344 QTC  Calculation: 396 R Axis:   31  Text Interpretation: Sinus rhythm with frequent Premature ventricular complexes in a pattern of bigeminy Right atrial enlargement Septal infarct , age undetermined When compared with ECG of 18-Oct-2020 21:49, PREVIOUS ECG IS PRESENT Confirmed by Huntley Dec reddy [phone]) on 02/25/2023 2:27:16 PM    Recent Labs: 11/20/2022: ALT 13; BUN 11; Creatinine, Ser 0.68; Hemoglobin 12.6; Platelets 205.0; Potassium 4.1; Sodium 138  Recent Lipid Panel    Component Value Date/Time   CHOL 136 11/20/2022 0912   CHOL 146 08/15/2021 1608   TRIG 108.0 11/20/2022 0912   HDL 58.60 11/20/2022 0912   HDL 77 08/15/2021 1608   CHOLHDL 2 11/20/2022 0912   VLDL 21.6 11/20/2022 0912   LDLCALC 56 11/20/2022 0912   LDLCALC 51 08/15/2021 1608   LDLCALC 52 02/05/2020 1039    Physical Exam:    VS:  BP 104/66   Pulse 80   Ht 5\' 3"  (1.6 m)   Wt 153 lb 1.9 oz (69.5 kg)   SpO2 96%   BMI 27.12 kg/m     Wt Readings from Last 3 Encounters:  02/25/23 153 lb 1.9 oz (69.5 kg)  11/20/22 165 lb 8 oz (75.1 kg)  10/10/22 166 lb 0.4 oz (75.3 kg)     GENERAL:  Well nourished, well developed in no acute distress NECK: No JVD; No carotid bruits CARDIAC: RRR, S1 and S2 present, no murmurs, no rubs, no gallops CHEST:  Clear to auscultation without rales, wheezing or rhonchi  ABDOMEN: Soft, non-tender, non-distended Extremities: No pitting pedal edema. Pulses bilaterally symmetric with radial 2+ and dorsalis pedis 2+ NEUROLOGIC:  Alert and oriented x 3  Medication Adjustments/Labs and Tests Ordered: Current medicines are reviewed at length with the patient today.  Concerns regarding medicines are outlined above.  Orders Placed This Encounter  Procedures   Ambulatory referral to Genetics   EKG 12-Lead   ECHOCARDIOGRAM COMPLETE   Meds ordered this encounter  Medications   nitroGLYCERIN (NITROSTAT) 0.4 MG SL tablet    Sig: Place 1 tablet (0.4  mg total) under the tongue every 5  (five) minutes as needed for chest pain.    Dispense:  25 tablet    Refill:  11   sacubitril-valsartan (ENTRESTO) 24-26 MG    Sig: Take 1 tablet by mouth 2 (two) times daily. 2nd attempt Needs appointment for future refill    Dispense:  180 tablet    Refill:  3    Signed, Storey Stangeland reddy Galilea Q[place]o, MD, MPH, [facility]. 02/25/2023 2:31 PM    [facility]

## 2023-02-25 NOTE — Assessment & Plan Note (Signed)
Well-controlled. Continue target below 130 over 80 mmHg.

## 2023-03-06 ENCOUNTER — Other Ambulatory Visit: Payer: Self-pay | Admitting: Cardiology

## 2023-03-12 ENCOUNTER — Telehealth: Payer: Self-pay

## 2023-03-12 ENCOUNTER — Encounter: Payer: Self-pay | Admitting: Family Medicine

## 2023-03-12 MED ORDER — ENTRESTO 24-26 MG PO TABS: 1.0000 | ORAL_TABLET | Freq: Two times a day (BID) | ORAL | 12 refills | Status: AC

## 2023-03-12 NOTE — Telephone Encounter (Signed)
*  STAT* If patient is at the pharmacy, call can be transferred to refill team.   1. Which medications need to be refilled? (please list name of each medication and dose if known) sacubitril-valsartan (ENTRESTO) 24-26 MG   2. Which pharmacy/location (including street and city if local pharmacy) is medication to be sent to?  [facility] - [place], [place] - [address] AT Portal to Registered [facility] Sites      3. Do they need a 30 day or 90 day supply? 30 day    Pt is out of medication

## 2023-03-12 NOTE — Telephone Encounter (Signed)
RX sent

## 2023-03-14 ENCOUNTER — Other Ambulatory Visit: Payer: Self-pay | Admitting: Family Medicine

## 2023-03-21 ENCOUNTER — Ambulatory Visit (HOSPITAL_BASED_OUTPATIENT_CLINIC_OR_DEPARTMENT_OTHER)
Admission: RE | Admit: 2023-03-21 | Discharge: 2023-03-21 | Disposition: A | Discharge status: Home / Self Care | Payer: 59 | Source: Ambulatory Visit

## 2023-03-21 DIAGNOSIS — I428 Other cardiomyopathies: Secondary | ICD-10-CM | POA: Insufficient documentation

## 2023-03-21 LAB — ECHOCARDIOGRAM COMPLETE
AR max vel: 2.13 cm2
AV Area VTI: 2.03 cm2
AV Area mean vel: 2.22 cm2
AV Mean grad: 4 mm[Hg]
AV Peak grad: 7.5 mm[Hg]
Ao pk vel: 1.37 m/s
Area-P 1/2: 4.71 cm2
Calc EF: 55.2 %
MV M vel: 3.61 m/s
MV Peak grad: 52.1 mm[Hg]
S' Lateral: 3.6 cm
Single Plane A2C EF: 52.6 %
Single Plane A4C EF: 57.6 %

## 2023-06-16 ENCOUNTER — Other Ambulatory Visit: Payer: Self-pay | Admitting: Family Medicine

## 2023-08-13 ENCOUNTER — Other Ambulatory Visit: Payer: Self-pay | Admitting: Family Medicine

## 2023-12-10 ENCOUNTER — Other Ambulatory Visit: Payer: Self-pay | Admitting: Medical Genetics

## 2024-02-24 ENCOUNTER — Other Ambulatory Visit: Payer: Self-pay | Admitting: Medical Genetics

## 2024-02-24 DIAGNOSIS — Z006 Encounter for examination for normal comparison and control in clinical research program: Secondary | ICD-10-CM
